# Patient Record
Sex: Male | Born: 1965 | Race: White | Hispanic: No | Marital: Married | Smoking: Never smoker
Health system: Southern US, Community
[De-identification: ages and names within clinical notes are randomized; demographics above are authoritative.]

## PROBLEM LIST (undated history)

## (undated) DIAGNOSIS — F419 Anxiety disorder, unspecified: Secondary | ICD-10-CM

## (undated) DIAGNOSIS — E785 Hyperlipidemia, unspecified: Secondary | ICD-10-CM

## (undated) DIAGNOSIS — E079 Disorder of thyroid, unspecified: Secondary | ICD-10-CM

## (undated) DIAGNOSIS — T7840XA Allergy, unspecified, initial encounter: Secondary | ICD-10-CM

## (undated) DIAGNOSIS — M1990 Unspecified osteoarthritis, unspecified site: Secondary | ICD-10-CM

## (undated) DIAGNOSIS — I10 Essential (primary) hypertension: Principal | ICD-10-CM

## (undated) DIAGNOSIS — E781 Pure hyperglyceridemia: Secondary | ICD-10-CM

## (undated) DIAGNOSIS — D3500 Benign neoplasm of unspecified adrenal gland: Secondary | ICD-10-CM

## (undated) HISTORY — DX: Anxiety disorder, unspecified: F41.9

## (undated) HISTORY — DX: Essential (primary) hypertension: I10

## (undated) HISTORY — DX: Allergy, unspecified, initial encounter: T78.40XA

## (undated) HISTORY — DX: Hyperlipidemia, unspecified: E78.5

## (undated) HISTORY — DX: Disorder of thyroid, unspecified: E07.9

## (undated) HISTORY — DX: Unspecified osteoarthritis, unspecified site: M19.90

## (undated) HISTORY — DX: Pure hyperglyceridemia: E78.1

## (undated) HISTORY — DX: Benign neoplasm of unspecified adrenal gland: D35.00

---

## 2009-02-22 HISTORY — PX: HERNIA REPAIR: SHX51

## 2011-10-20 ENCOUNTER — Ambulatory Visit (INDEPENDENT_AMBULATORY_CARE_PROVIDER_SITE_OTHER): Payer: BC Managed Care – PPO | Admitting: Sports Medicine

## 2011-10-20 VITALS — BP 114/82 | Ht 72.0 in | Wt 175.0 lb

## 2011-10-20 DIAGNOSIS — M7051 Other bursitis of knee, right knee: Secondary | ICD-10-CM | POA: Insufficient documentation

## 2011-10-20 DIAGNOSIS — IMO0002 Reserved for concepts with insufficient information to code with codable children: Secondary | ICD-10-CM

## 2011-10-20 NOTE — Progress Notes (Signed)
Patient ID: Sean Mckay, male   DOB: [birth year], 46 y.o.   MRN: [id number] Patient is a 46 year old male who works [employer] and past centers. He presents today with right knee pain. He reports that the pain has been causing him problems since the middle of February. It seems to be made worse by crawling, which he does on a regular basis, or by putting pressure on the bottom of his feet such as when he puts on socks. He does not remember a specific injury. The pain is generally located on the medial and anterior side of the right knee with perhaps some radiation proximal to this. He uses a variety of different terms to describe it including burning and throbbing. He has never injured this knee before and is currently taking no medications for it.  Objective: General: Appropriate weight male, well-developed, no distress Knee: Right knee has a slight amount of fullness on the medial side when compared to the left. There is no other erythema or obvious effusion. There is very mild tenderness to deep palpation just medial to the patellar tendon on the right. Otherwise knee exam is unremarkable. MCL/LCL are intact on exam, knee has full range of motion with no pain, and McMurray's is negative.  MSK ultrasound: Small increase in fluid in the Pes Anserine bursa on the right.  Normal patellar tendon and menisci.  Slight bone spur on the tibial tuberal.;  Distal infrapatellar bursa is also swollen without any increase in doppler flow  Assessment/Plan: Pes anserine bursitis.  Treat with ice/NSAIDs, compression when flaring, and attempts at activity modification and padding that knee.  Will give full knee compression brace to use as needed.  RTC PRN.

## 2011-10-20 NOTE — Assessment & Plan Note (Signed)
This primarily seems pressure related f his crawling actiivty  Also bursal swelling beneath PT  Ice massage  Use compression sleeve if this gives good protection  Reck if this does not settle down in 4 wks

## 2011-10-20 NOTE — Patient Instructions (Signed)
It was great to see you today! We want you to use some type of compression and cushioning during work.  If you have a day that it is particularly bothering you, please do an ice massage after work. If you have several days where it flairs up, start using either Advil three times per day or Aleve twice per day for several days until it calms down. Come back to see [place] as needed if your problems get worse.

## 2011-10-27 ENCOUNTER — Encounter (INDEPENDENT_AMBULATORY_CARE_PROVIDER_SITE_OTHER): Payer: Self-pay | Admitting: General Surgery

## 2011-10-27 ENCOUNTER — Ambulatory Visit (INDEPENDENT_AMBULATORY_CARE_PROVIDER_SITE_OTHER): Payer: BC Managed Care – PPO | Admitting: General Surgery

## 2011-10-27 VITALS — BP 120/81 | HR 61 | Temp 98.0°F | Ht 72.0 in | Wt 180.8 lb

## 2011-10-27 DIAGNOSIS — R1031 Right lower quadrant pain: Secondary | ICD-10-CM

## 2011-10-27 DIAGNOSIS — IMO0001 Reserved for inherently not codable concepts without codable children: Secondary | ICD-10-CM

## 2011-10-27 NOTE — Progress Notes (Signed)
Patient ID: Sean Mckay, male   DOB: 1965/10/24, 46 y.o.   MRN: [id number]  Chief Complaint  Patient presents with  . Pre-op Exam    eval hernia    HPI Sean Mckay is a 46 y.o. male.  He is self-referred for right groin pain and recurrent right inguinal hernia.  The patient presents today stating that he had a right inguinal hernia operation in 2010 in [place]. He brought an office note from [facility] by Dr. Marcial Pacas L. Sabino Gasser dated February 07, 2009. He also has a dictated operative note by Dr. Tobie Lords dated February 22, 2009 at which time he repaired a direct right inguinal hernia with mesh and excised a cord lipoma. That is all the records that he brought. He states that since that time he has had constant itching and dull pain in the right groin. He says that he intermittently has some sharper shooting pains down the thigh and the right flank this is not constant. He denies feeling a bulge.  He states that he has seen a pain clinic . in [facility], by ananesthesiologist Dr. Emmaline Life. He was sent there by the urologist according to him. He also states he saw a Gen. Careers adviser in [place] who reportedly told hi he had scar tissue .  He is seeking another opinion for his symptoms. HPI  Past Medical History  Diagnosis Date  . Hyperlipidemia   . Hypertension     Past Surgical History  Procedure Date  . Hernia repair 02/22/09    RIH    Family History  Problem Relation Age of Onset  . Cancer Father     leukemia  . Cancer Paternal Grandmother     colon    Social History History  Substance Use Topics  . Smoking status: Never Smoker   . Smokeless tobacco: Not on file  . Alcohol Use: No    Allergies  Allergen Reactions  . Phenergan     Current Outpatient Prescriptions  Medication Sig Dispense Refill  . aspirin 81 MG tablet Take 81 mg by mouth daily.      . Nebivolol HCl (BYSTOLIC PO) Take 1 tablet by mouth daily.        Review of  Systems Review of Systems  Constitutional: Negative for fever, chills and unexpected weight change.  HENT: Negative for hearing loss, congestion, sore throat, trouble swallowing and voice change.   Eyes: Negative for visual disturbance.  Respiratory: Negative for cough and wheezing.   Cardiovascular: Negative for chest pain, palpitations and leg swelling.  Gastrointestinal: Negative for nausea, vomiting, abdominal pain, diarrhea, constipation, blood in stool, abdominal distention, anal bleeding and rectal pain.  Genitourinary: Negative for hematuria and difficulty urinating.  Musculoskeletal: Negative for arthralgias.  Skin: Negative for rash and wound.  Neurological: Negative for seizures, syncope, weakness and headaches.  Hematological: Negative for adenopathy. Does not bruise/bleed easily.  Psychiatric/Behavioral: Negative for confusion.    Blood pressure 120/81, pulse 61, temperature 98 F (36.7 C), temperature source Temporal, height 6' (1.829 m), weight 180 lb 12.8 oz (82.01 kg), SpO2 96.00%.  Physical Exam Physical Exam  Constitutional: He is oriented to person, place, and time. He appears well-developed and well-nourished. No distress.  HENT:  Head: Normocephalic.  Nose: Nose normal.  Mouth/Throat: No oropharyngeal exudate.  Eyes: Conjunctivae and EOM are normal. Pupils are equal, round, and reactive to light. Right eye exhibits no discharge. Left eye exhibits no discharge. No scleral icterus.  Neck: Normal  range of motion. Neck supple. No JVD present. No tracheal deviation present. No thyromegaly present.  Cardiovascular: Normal rate, regular rhythm, normal heart sounds and intact distal pulses.   No murmur heard. Pulmonary/Chest: Effort normal and breath sounds normal. No stridor. No respiratory distress. He has no wheezes. He has no rales. He exhibits no tenderness.  Abdominal: Soft. Bowel sounds are normal. He exhibits no distension and no mass. There is no tenderness.  There is no rebound and no guarding.  Genitourinary:       Right inguinal scar well healed. Skin is healthy. Both testes descended, soft, no palpable abnormality, not atrophic. Examined supine and standing. There is no evidence of recurrent hernia. There is no mass. There is no significant tenderness or trigger point. There is no seroma.  Musculoskeletal: Normal range of motion. He exhibits no edema and no tenderness.  Lymphadenopathy:    He has no cervical adenopathy.  Neurological: He is alert and oriented to person, place, and time. He has normal reflexes. Coordination normal.  Skin: Skin is warm and dry. No rash noted. He is not diaphoretic. No erythema. No pallor.  Psychiatric: He has a normal mood and affect. His behavior is normal. Judgment and thought content normal.    [facility]. Operative note from Dr. Sabino Gasser dated February 22, 2009.  Assessment    Chronic right groin pain. This may be due to scar tissue or neuropathic pain. There is no evidence of recurrent hernia or infection. There is no evidence of testicular damage.  History of open repair right inguinal hernia with mesh 2010, in [place]   Plan    I had a long discussion with the patient regarding my review of his records, physical findings, and my  impression  regarding the pathophysiology of his pain.  He asked what his options were and my advice to him was referral back to the pain clinic anesthesiologist in [place], referral to a pain clinic in [facility] for a second opinion, or referral to a pain clinic at one of the universities. I told him that I did not think that reexploration of his right groin surgically would be beneficial, and  that was likely to cause more problems than benefit him. He seems to understand this.  He stated that he was going to go home and think about this. He will call is if he wants [place] to refer him to the pain clinic in  [place].       Ernestene Mention 10/27/2011, 5:22 PM

## 2011-10-27 NOTE — Patient Instructions (Signed)
The pain you are having in your right groin is not due to a recurrent hernia, and it is not due to an infection.  It is possible that you have chronic pain from scar tissue or nerve damage.  Please call us back if you wish [place]  to refer you to another pain clinic for a second opinion for management.

## 2015-12-18 ENCOUNTER — Telehealth: Payer: Self-pay | Admitting: Cardiology

## 2015-12-18 NOTE — Telephone Encounter (Signed)
Received records from [facility] for appointment on 01/23/16 with Dr Stanford Breed.   Records given to [facility] (medical records) for Dr Jacalyn Lefevre schedule on 01/23/16.  lp

## 2015-12-22 NOTE — Progress Notes (Signed)
Cardiology Office Note   Date:  12/24/2015   ID:  Teodoro Kil, DOB 03/01/1966, MRN [id number]  PCP:  Dyann Ruddle, MD  Cardiologist:   Skeet Latch, MD   Chief Complaint  Patient presents with  . New Evaluation    Referred by Samantha Crimes Johnson-Leonard, ANP for Uncontrolled HTN  pt c/o chest heaviness and SOB--occasional sharp pain on left side; no other Sx.    History of Present Illness: Sean Mckay is a 50 y.o. male with hypertension and hyperlipidemia who presents for management of hypertension. He has been taking Bytstolic for many years and notes that his blood pressure has been well-controlled until recently.  In January he developed headaches, chest pressure and shortness of breath.  In February he discovered that his BP was elevated and lisinopril was added to his regimen.  His blood pressure Was fairly well-controlled on this medication, however he developed dizziness. This was discontinued but he presented to the emergency department at [facility] on 12/02/15 for chest pain, dizziness and hypertensive emergency. His blood pressure was 188/118 and time.  Amlodipine was added to his regimen, but he had a syncopal episode in the absence of low blood pressure, so this was discontinued.  He was started on hydralazine prior to discharge.  His blood pressure was well-controlled, but he developed fatigue and muscle pain, so he stopped it.   He followed up at his primary care office and requested a referral to cardiology due to poorly controlled blood pressure and a family history of heart disease.  At that appointment he was started on nifedipine instead of hydralazine.  His muscle pain has improved but he now reports shortness of breath and the fatigue persists.  Also, his BP is not as well-controlled.    During the hospitalization Mr. Illes reportedly had carotid Dopplers that did not reveal any obstruction. He also had a nuclear stress test that was negative for  ischemia.  His lipids were checked and reportedly showed elevated triglycerides but were otherwise okay.  He reports wearing a 48-hour Holter ears ago that showed PACs and PVCs of note, Mr. Nyarko has a brother who died of sudden cardiac death at age 4. No autopsy was performed.  Both his mother and father have had heart attacks and bypass surgery. Maternal grandfather had a heart attack at age 75.  Mr. Joyner exercises at the gym at least twice weekly.  He exercises for one hour and denies chest pain or shortness of breath.  Overall his diet is healthy but he struggles with drinking sodas.    Adverse effects:  Amlodipine: syncope Lisinopril: dizziness Hydralazine: Muscle aches, fatigue Nifedipine: fatigue, shortness of breath  Past Medical History  Diagnosis Date  . Hyperlipidemia   . Hypertension     Past Surgical History  Procedure Laterality Date  . Hernia repair  02/22/09    RIH     Current Outpatient Prescriptions  Medication Sig Dispense Refill  . aspirin 81 MG tablet Take 81 mg by mouth daily.    . fenofibrate 160 MG tablet Take 1 tablet by mouth daily.  3  . levothyroxine (SYNTHROID, LEVOTHROID) 112 MCG tablet Take 1 tablet by mouth daily.  5  . Nebivolol HCl (BYSTOLIC PO) Take 20 mg by mouth daily.     . hydrochlorothiazide (MICROZIDE) 12.5 MG capsule Take 1 capsule (12.5 mg total) by mouth daily. 90 capsule 3   No current facility-administered medications for this visit.    Allergies:  Hydralazine; Lisinopril; Norvasc; Prednisone; Promethazine hcl; and Statins    Social History:  The patient  reports that he has never smoked. He does not have any smokeless tobacco history on file. He reports that he does not drink alcohol or use illicit drugs.   Family History:  The patient's family history includes Aneurysm in his maternal grandmother; Cancer in his father and paternal grandmother; Heart attack in his father, maternal grandfather, and mother; Stroke in his  maternal grandmother; Sudden death in his brother.    ROS:  Please see the history of present illness.   Otherwise, review of systems are positive for none.   All other systems are reviewed and negative.    PHYSICAL EXAM: VS:  BP 124/70 mmHg  Pulse 54  Ht 6' (1.829 m)  Wt 190 lb 9.6 oz (86.456 kg)  BMI 25.84 kg/m2 , BMI Body mass index is 25.84 kg/(m^2). GENERAL:  Well appearing HEENT:  Pupils equal round and reactive, fundi not visualized, oral mucosa unremarkable NECK:  No jugular venous distention, waveform within normal limits, carotid upstroke brisk and symmetric, no bruits, no thyromegaly LYMPHATICS:  No cervical adenopathy LUNGS:  Clear to auscultation bilaterally HEART:  RRR.  PMI not displaced or sustained,S1 and S2 within normal limits, no S3, no S4, no clicks, no rubs, no murmurs ABD:  Flat, positive bowel sounds normal in frequency in pitch, no bruits, no rebound, no guarding, no midline pulsatile mass, no hepatomegaly, no splenomegaly EXT:  2 plus pulses throughout, no edema, no cyanosis no clubbing SKIN:  No rashes no nodules NEURO:  Cranial nerves II through XII grossly intact, motor grossly intact throughout PSYCH:  Cognitively intact, oriented to person place and time    EKG:  EKG is ordered today. The ekg ordered today demonstrates Sinus bradycardia rate 54 bpm.  R axis deviation.    Recent Labs: No results found for requested labs within last 365 days.    Lipid Panel No results found for: CHOL, TRIG, HDL, CHOLHDL, VLDL, LDLCALC, LDLDIRECT    Wt Readings from Last 3 Encounters:  12/24/15 190 lb 9.6 oz (86.456 kg)  10/27/11 180 lb 12.8 oz (82.01 kg)  10/20/11 175 lb (79.379 kg)      ASSESSMENT AND PLAN:  # Hypertension  Blood pressure remains poorly controlled and he is not tolerating nifedipine well. We will stop nifedipine and start hydrochlorothiazide 12.5 mg daily. He will continue Bystolic 20 mg daily. In one week he will have a basic metabolic  panel checked with his primary care physician and have these results faxed to [place]. We will see him in 2 weeks to follow-up on his blood pressure control.  He had a renal artery ultrasound that did not reveal stenosis. Primary hyperaldosteronism is unlikely given that he does not have hypokalemia. Blood pressure is not refractive at this point he is only on 2 medications at this time and his blood pressure was well-controlled on by systolic and hydralazine. He was discontinued due to rather than failure of treatment. Therefore we will not pursue any additional testing for causes of secondary hypertension at this time.   # FH SCD: Mr. Aase reports a family history of sudden cardiac death in his 13 year old brother. He had a stress test in the hospital that he reports was negative for ischemia. We will obtain these records. We will also get his lipid profile from that hospitalization to determine if he needs any additional primary prevention with a statin.  There is no evidence  of Brugada or QT abnormalities on his EKG.  # Hypertriglyceridemia: Obtain lipids as above.  Continue fenofibrate.  # Atypical chest pain: Mr. Gurnee reports chest pain but it is atypical and he had a recent negative stress test. He does not have any exertional symptoms. No further testing is indicated at this time.  Current medicines are reviewed at length with the patient today.  The patient does not have concerns regarding medicines.  The following changes have been made:  Start HCTZ.  Stop nifedipine  Labs/ tests ordered today include:    Orders Placed This Encounter  Procedures  . Basic Metabolic Panel (BMET)  . EKG 12-Lead     Disposition:   FU with Breon Diss C. Oval Linsey, MD, [facility] in 2-4 weeks.    This note was written with the assistance of speech recognition software.  Please excuse any transcriptional errors.  Signed, Kerah Hardebeck C. Oval Linsey, MD, [facility]  12/24/2015 4:46 PM    [facility]

## 2015-12-24 ENCOUNTER — Encounter: Payer: Self-pay | Admitting: Cardiovascular Disease

## 2015-12-24 ENCOUNTER — Ambulatory Visit (INDEPENDENT_AMBULATORY_CARE_PROVIDER_SITE_OTHER): Payer: BLUE CROSS/BLUE SHIELD | Admitting: Cardiovascular Disease

## 2015-12-24 VITALS — BP 124/70 | HR 54 | Ht 72.0 in | Wt 190.6 lb

## 2015-12-24 DIAGNOSIS — E781 Pure hyperglyceridemia: Secondary | ICD-10-CM

## 2015-12-24 DIAGNOSIS — I10 Essential (primary) hypertension: Secondary | ICD-10-CM

## 2015-12-24 MED ORDER — HYDROCHLOROTHIAZIDE 12.5 MG PO CAPS: 12.5000 mg | ORAL_CAPSULE | Freq: Every day | ORAL | Status: DC

## 2015-12-24 NOTE — Patient Instructions (Signed)
Medication Instructions:   STOP NIFEDIPINE  START HCTZ 12.5 MG ONCE DAILY  Labwork:  Your physician recommends that you return for lab work in: [place]  Follow-Up:  Your physician recommends that you schedule a follow-up appointment in: 2-4 WEEKS WITH DR [facility]

## 2015-12-25 ENCOUNTER — Telehealth: Payer: Self-pay | Admitting: Cardiovascular Disease

## 2015-12-25 NOTE — Telephone Encounter (Signed)
Received records from [facility] as requested by Dr Oval Linsey.  Records given to Dr Oval Linsey for review and upcoming appointment on 02/19/16.  lp

## 2016-01-01 ENCOUNTER — Telehealth: Payer: Self-pay | Admitting: Cardiovascular Disease

## 2016-01-01 NOTE — Telephone Encounter (Signed)
Increase HCTZ to 25mg daily

## 2016-01-01 NOTE — Telephone Encounter (Signed)
Returned call to patient.He stated at his visit with Dr.Hiawatha 12/24/15 she stopped Nifedipine and started HCTZ 12.5 mg daily.Stated since the change he has noticed B/P going up each day.B/P today 151/95.Message sent to [place] for advice.

## 2016-01-01 NOTE — Telephone Encounter (Signed)
New message    Pt c/o medication issue:  1. Name of Medication: Hydrochlorothizide and Bystolic  2. How are you currently taking this medication (dosage and times per day)? 12.5 mg po daily/20 mg po daily  3. Are you having a reaction (difficulty breathing--STAT)? no  4. What is your medication issue?  The pt states the medications are not helping the blood pressure at all   The pt is calling with concerns the medicine is not help his blood pressure

## 2016-01-02 MED ORDER — HYDROCHLOROTHIAZIDE 25 MG PO TABS: 25.0000 mg | ORAL_TABLET | Freq: Every day | ORAL | Status: DC

## 2016-01-02 NOTE — Telephone Encounter (Signed)
Returned call to patient.Dr. advised to increase HCTZ to 25 mg daily.Advised to continue to monitor B/P and call back if continues to be elevated.

## 2016-01-23 ENCOUNTER — Ambulatory Visit: Payer: Self-pay | Admitting: Cardiology

## 2016-01-24 ENCOUNTER — Telehealth: Payer: Self-pay | Admitting: Cardiovascular Disease

## 2016-01-24 MED ORDER — VALSARTAN 40 MG PO TABS: 40.0000 mg | ORAL_TABLET | Freq: Every day | ORAL | Status: DC

## 2016-01-24 NOTE — Telephone Encounter (Signed)
Follow-up ° ° ° ° °The pt is returning the nurses call °

## 2016-01-24 NOTE — Telephone Encounter (Signed)
Pt c/o medication issue:  1. Name of Medication: hydrocloridoxy  2. How are you currently taking this medication (dosage and times per day)? 25mg  1x day 3. Are you having a reaction (difficulty breathing--STAT)? no  4. What is your medication issue? Low energy, constant cough, vision is different, sexual drive is down, dark order urine, constantly thirsty, and he is dizzy when he stands

## 2016-01-24 NOTE — Telephone Encounter (Signed)
Spoke to patient -   He reports that his BP have been well controlled on his current regimen, but currently it is a quality of life issue.  He has had persistent diarrhea since starting the HCTZ and all of his symptoms have worsened since increasing his dose.  Unfortunately he has tried many different medications and all have caused issues. In addition many blood pressure medications are associated with impotence in some level, though elevated BP can also cause this. I educated him on this.   He wishes to try something different to at least help with hydration. He reports he stopped the HCTZ yesterday. Will have him remain off HCTZ and start a low dose of valsartan 40mg  daily. Have chosen an ARB since it appears he has not yet tried this class of medications. He will follow up with Dr. Oval Linsey as scheduled in a few weeks. At that time he can be referred to HTN clinic if needed.   Patient agreeable to this plan.

## 2016-01-24 NOTE — Telephone Encounter (Signed)
Returned call to patient. Symptoms started since starting on HCTZ and increasing bystolic.  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 Oval Linsey & clinical pharmacy staff to review symptoms and current medications and he will be contacted w/advice. He voiced understanding.

## 2016-01-31 ENCOUNTER — Telehealth: Payer: Self-pay | Admitting: Cardiovascular Disease

## 2016-01-31 MED ORDER — VALSARTAN 80 MG PO TABS: 80.0000 mg | ORAL_TABLET | Freq: Every day | ORAL | Status: DC

## 2016-01-31 NOTE — Telephone Encounter (Signed)
Pt c/o BP issue: STAT if pt c/o blurred vision, one-sided weakness or slurred speech  1. What are your last 5 BP readings? 148/101 this AM- 7/20, 151/94- last night 7/19, 164/107- couple days ago  2. Are you having any other symptoms (ex. Dizziness, headache, blurred vision, passed out)? Some pressure in head/chest, some SOB- started couple days ago  3. What is your BP issue? After recent med adjustment, pt noticed inc in BP

## 2016-01-31 NOTE — Telephone Encounter (Signed)
Spoke with pt. HCTZ was stopped recently and Valsartan started. Pt made this change on 7/13. He reports the following BP/heart rate readings 7/20-148/101,56.  --Has not taken Valsartan and bystolic yet today. 7/19 -- AM-132/90,67. [id number]. 7/18-AM-142/90,66 [id number]. 7/17- AM-150/93, 70. [id number] 7/16-AM-129/82,73. [id number] 7/15-131/92,67 7/14-100/70,92.  This was after working in the yard using Chiropractor.  He reports prior to stopping HCTZ his readings were typically 115-120/80's.  He reports chest pressure, headache and shortness of breath since BP elevated.  He has had these symptoms in past when BP is elevated.  He is flying to [place] tomorrow and will return on 7/29.  Will forward to pharmacist and Dr. Oval Linsey for review/recommendations.

## 2016-01-31 NOTE — Telephone Encounter (Signed)
Returned call and LM.

## 2016-01-31 NOTE — Telephone Encounter (Signed)
Spoke with patient, will have him increase valsartan to 80 mg daily.  He can continue to monitor BP readings and call when returns from his vacation.  Patient voiced understanding.

## 2016-02-18 NOTE — Progress Notes (Addendum)
Cardiology Office Note   Date:  02/19/2016   ID:  Bexton Charron, DOB October 15, 1965, MRN [id number]  PCP:  Dyann Ruddle, MD  Cardiologist:   Skeet Latch, MD   No chief complaint on file.   History of Present Illness: Sean Mckay is a 50 y.o. male with hypertension and hyperlipidemia who presents for follow up on hypertension.  Mr. Sean Mckay was first seen 12/2015 due to poorly-controlled hypertension.  Mr. Sean Mckay has struggled with poorly-controlled hypertension and intolerance to many medications.  He had renal artery Dopplers that were negative for renal artery stenosis.  He also had carotid Dopplers due to an episode of syncope that were negative for obstruction.  He also had a nuclear stress test that was negative for ischemia.  His lipids were checked and reportedly showed elevated triglycerides but were otherwise okay.  He reports wearing a 48-hour Holter years ago that showed PACs and PVCs of note, Mr. Sean Mckay has a brother who died of sudden cardiac death at age 59. No autopsy was performed.  Both his mother and father have had heart attacks and bypass surgery. Maternal grandfather had a heart attack at age 52.  Given his family history of premature CAD and difficult to manage hypertension, he was referred to cardiology for management.  At his June appointment [name] was started on HCTZ  12.5 mg daily.  However he did not tolerate this and was started on valsartan, which was subsequently increased to 80 mg due to poor BP control. His BP has been around 140/90.  He notes that he has been feeling exhausted and easily short of breath. This has been ongoing since his hospitalization. He also has difficulty staying asleep.  His husband notes that he snores and has apneic episodes.   Adverse effects:  Amlodipine: syncope Lisinopril: dizziness Hydralazine: Muscle aches, fatigue Nifedipine: fatigue, shortness of breath HCTZ: thirst, cough, tremor   Past Medical History:    Diagnosis Date  . Hyperlipidemia   . Hypertension     Past Surgical History:  Procedure Laterality Date  . HERNIA REPAIR  02/22/09   [facility]     Current Outpatient Prescriptions  Medication Sig Dispense Refill  . aspirin 81 MG tablet Take 81 mg by mouth daily.    . fenofibrate 160 MG tablet Take 1 tablet by mouth daily.  3  . levothyroxine (SYNTHROID, LEVOTHROID) 112 MCG tablet Take 1 tablet by mouth daily.  5  . Nebivolol HCl (BYSTOLIC PO) Take 10 mg by mouth daily.     Marland Kitchen omega-3 acid ethyl esters (LOVAZA) 1 g capsule Take 1 g by mouth daily.    Marland Kitchen omeprazole (PRILOSEC) 20 MG capsule Take 20 mg by mouth daily.    Marland Kitchen terbinafine (LAMISIL) 250 MG tablet Take 1 tablet by mouth daily.  3  . valsartan (DIOVAN) 80 MG tablet Take 1 tablet (80 mg total) by mouth daily. 30 tablet 5  . vitamin E 200 UNIT capsule Take 200 Units by mouth daily.     No current facility-administered medications for this visit.     Allergies:   Hydralazine; Lisinopril; Norvasc [amlodipine besylate]; Prednisone; Promethazine hcl; and Statins    Social History:  The patient  reports that he has never smoked. He does not have any smokeless tobacco history on file. He reports that he does not drink alcohol or use drugs.   Family History:  The patient's family history includes Aneurysm in his maternal grandmother; Cancer in his father and paternal grandmother;  Heart attack in his father, maternal grandfather, and mother; Stroke in his maternal grandmother; Sudden death in his brother.    ROS:  Please see the history of present illness.   Otherwise, review of systems are positive for none.   All other systems are reviewed and negative.    PHYSICAL EXAM: VS:  BP 122/82   Pulse (!) 55   Ht 6' (1.829 m)   Wt 191 lb (86.6 kg)   SpO2 96%   BMI 25.90 kg/m  , BMI Body mass index is 25.9 kg/m. GENERAL:  Well appearing HEENT:  Pupils equal round and reactive, fundi not visualized, oral mucosa unremarkable NECK:  No  jugular venous distention, waveform within normal limits, carotid upstroke brisk and symmetric, no bruits, no thyromegaly LYMPHATICS:  No cervical adenopathy LUNGS:  Clear to auscultation bilaterally HEART:  RRR.  PMI not displaced or sustained,S1 and S2 within normal limits, no S3, no S4, no clicks, no rubs, no murmurs ABD:  Flat, positive bowel sounds normal in frequency in pitch, no bruits, no rebound, no guarding, no midline pulsatile mass, no hepatomegaly, no splenomegaly EXT:  2 plus pulses throughout, no edema, no cyanosis no clubbing SKIN:  No rashes no nodules NEURO:  Cranial nerves II through XII grossly intact, motor grossly intact throughout PSYCH:  Cognitively intact, oriented to person place and time   EKG:  EKG is not ordered today. The ekg ordered 12/24/15 demonstrates Sinus bradycardia rate 54 bpm.  R axis deviation.   Lexiscan Myoview 12/03/15:  LVEF 61%.  No ischemia.  Carotid artery Doppler 12/05/15: 1-39% ICA stenosis bilaterally  Echo 12/03/15:  Normal LV size and function.  Mild MR, trace TR.   Recent Labs: No results found for requested labs within last 8760 hours.    Lipid Panel No results found for: CHOL, TRIG, HDL, CHOLHDL, VLDL, LDLCALC, LDLDIRECT  12/02/15: Total cholesterol 199, triglycerides 180 HDL 36, LDL 126  Wt Readings from Last 3 Encounters:  02/19/16 191 lb (86.6 kg)  12/24/15 190 lb 9.6 oz (86.5 kg)  10/27/11 180 lb 12.8 oz (82 kg)      ASSESSMENT AND PLAN:  # Hypertension  Blood pressure is well-controlled today be elevated at home.  He thinks the higher dose of Bystolic is making him fatigued.  We will reduce this to 10 mg daily.  Continue valsartan 80 mg.  I explained that is is very unlikely that his BP will be controlled at this dose, given that is is not controlled at 20 mg.  If is BP is >140/90, he will increase valsartan to 160 mg daily.  He has undergone a work up for secondary causes of hypertension.   # Hypertriglyceridemia: Oheck  fasting lipids.  Continue fenofibrate.  # Snoring: Referral to sleep center.   Current medicines are reviewed at length with the patient today.  The patient does not have concerns regarding medicines.  The following changes have been made:  Reduce Bystolic to 10 mg daily.  Likely increase valsartan to 160 mg if BP remains >140/90.  Labs/ tests ordered today include:    Orders Placed This Encounter  Procedures  . Basic metabolic panel  . Lipid panel  . TSH  . Split night study     Disposition:   FU with Jerick Khachatryan C. Oval Linsey, MD, [facility] in 1 month   This note was written with the assistance of speech recognition software.  Please excuse any transcriptional errors.  Signed, Sara Keys C. Oval Linsey, MD, [facility]  02/19/2016  5:59 PM    [facility]

## 2016-02-19 ENCOUNTER — Encounter (INDEPENDENT_AMBULATORY_CARE_PROVIDER_SITE_OTHER): Payer: Self-pay

## 2016-02-19 ENCOUNTER — Encounter: Payer: Self-pay | Admitting: Cardiovascular Disease

## 2016-02-19 ENCOUNTER — Ambulatory Visit (INDEPENDENT_AMBULATORY_CARE_PROVIDER_SITE_OTHER): Payer: BLUE CROSS/BLUE SHIELD | Admitting: Cardiovascular Disease

## 2016-02-19 VITALS — BP 122/82 | HR 55 | Ht 72.0 in | Wt 191.0 lb

## 2016-02-19 DIAGNOSIS — E781 Pure hyperglyceridemia: Secondary | ICD-10-CM | POA: Diagnosis not present

## 2016-02-19 DIAGNOSIS — Z79899 Other long term (current) drug therapy: Secondary | ICD-10-CM | POA: Diagnosis not present

## 2016-02-19 DIAGNOSIS — R0683 Snoring: Secondary | ICD-10-CM | POA: Diagnosis not present

## 2016-02-19 DIAGNOSIS — I10 Essential (primary) hypertension: Secondary | ICD-10-CM | POA: Diagnosis not present

## 2016-02-19 NOTE — Patient Instructions (Addendum)
Medication Instructions:  DECREASE YOUR BYSTOLIC TO 10 MG DAILY  Labwork: LP/TSH/BMET AT [facility] ON THE FIRST FLOOR  Testing/Procedures: Your physician has recommended that you have a sleep study. This test records several body functions during sleep, including: brain activity, eye movement, oxygen and carbon dioxide blood levels, heart rate and rhythm, breathing rate and rhythm, the flow of air through your mouth and nose, snoring, body muscle movements, and chest and belly movement.  Follow-Up: Your physician recommends that you schedule a follow-up appointment in: 1 MONTH OV  Any Other Special Instructions Will Be Listed Below (If Applicable). MONITOR YOUR BLOOD PRESSURE FOR THE NEXT WEEK, IF IT START TO GO UP INCREASE YOUR VALSARTAN TO 160 MG DAILY   If you need a refill on your cardiac medications before your next appointment, please call your pharmacy.

## 2016-03-20 ENCOUNTER — Telehealth: Payer: Self-pay | Admitting: Cardiovascular Disease

## 2016-03-20 NOTE — Telephone Encounter (Signed)
Outside records reviewed:  02/20/16: Total cholesterol 244, triglycerides 179, HDL 48, LDL 187

## 2016-03-28 ENCOUNTER — Telehealth: Payer: Self-pay | Admitting: *Deleted

## 2016-03-28 NOTE — Telephone Encounter (Signed)
-----   Message from Skeet Latch, MD sent at 03/20/2016 10:20 AM EDT ----- Cholesterol levels are very elevated.  Please refer to lipid clinic.

## 2016-03-28 NOTE — Telephone Encounter (Signed)
Left message to call back  

## 2016-03-31 NOTE — Telephone Encounter (Signed)
Spoke with patient and he saw PCP recently. Per patient his PCP wanted to have him start Livalo however he refused Patient would like to keep follow up later this month and discuss with Dr Oval Linsey further

## 2016-04-01 ENCOUNTER — Ambulatory Visit (HOSPITAL_BASED_OUTPATIENT_CLINIC_OR_DEPARTMENT_OTHER): Payer: BLUE CROSS/BLUE SHIELD | Attending: Cardiovascular Disease | Admitting: Cardiovascular Disease

## 2016-04-01 VITALS — Ht 72.0 in | Wt 185.0 lb

## 2016-04-01 DIAGNOSIS — R5383 Other fatigue: Secondary | ICD-10-CM | POA: Diagnosis present

## 2016-04-01 DIAGNOSIS — R0683 Snoring: Secondary | ICD-10-CM | POA: Diagnosis not present

## 2016-04-01 DIAGNOSIS — G4733 Obstructive sleep apnea (adult) (pediatric): Secondary | ICD-10-CM

## 2016-04-01 DIAGNOSIS — G4710 Hypersomnia, unspecified: Secondary | ICD-10-CM | POA: Diagnosis present

## 2016-04-04 ENCOUNTER — Encounter: Payer: Self-pay | Admitting: Cardiovascular Disease

## 2016-04-07 ENCOUNTER — Ambulatory Visit (INDEPENDENT_AMBULATORY_CARE_PROVIDER_SITE_OTHER): Payer: BLUE CROSS/BLUE SHIELD | Admitting: Cardiovascular Disease

## 2016-04-07 ENCOUNTER — Encounter: Payer: Self-pay | Admitting: Cardiovascular Disease

## 2016-04-07 VITALS — BP 134/84 | HR 58 | Ht 72.0 in | Wt 191.0 lb

## 2016-04-07 DIAGNOSIS — Z01812 Encounter for preprocedural laboratory examination: Secondary | ICD-10-CM

## 2016-04-07 DIAGNOSIS — R079 Chest pain, unspecified: Secondary | ICD-10-CM

## 2016-04-07 DIAGNOSIS — E785 Hyperlipidemia, unspecified: Secondary | ICD-10-CM

## 2016-04-07 DIAGNOSIS — I10 Essential (primary) hypertension: Secondary | ICD-10-CM | POA: Diagnosis not present

## 2016-04-07 NOTE — Progress Notes (Signed)
Cardiology Office Note   Date:  04/07/2016   ID:  Sean Mckay, DOB 03/23/1966, MRN [id number]  PCP:  Dyann Ruddle, MD  Cardiologist:   Skeet Latch, MD   Chief Complaint  Patient presents with  . Follow-up    1 MONTH; sharp pain in back radiating to chest.     History of Present Illness: Sean Mckay is a 50 y.o. male with hypertension and hyperlipidemia who presents for follow up on hypertension.  Sean Mckay was first seen 12/2015 due to poorly-controlled hypertension.  Sean Mckay has struggled with poorly-controlled hypertension and intolerance to many medications.  He had renal artery Dopplers that were negative for renal artery stenosis.  He also had carotid Dopplers due to an episode of syncope that were negative for obstruction.  He also had a nuclear stress test that was negative for ischemia.  His lipids were checked and reportedly showed elevated triglycerides but were otherwise okay.  He reports wearing a 48-hour Holter years ago that showed PACs and PVCs of note, Sean Mckay has a brother who died of sudden cardiac death at age 4. No autopsy was performed.  Both his mother and father have had heart attacks and bypass surgery. Maternal grandfather had a heart attack at age 59.  Given his family history of premature CAD and difficult to manage hypertension, he was referred to cardiology for management.  At his June appointment Sean Mckay was started on HCTZ  12.5 mg daily.  However he did not tolerate this and was started on valsartan, which was subsequently increased to 80 mg due to poor BP control.  He developed fatigue and shortness of breath on nebivolol 20mg  daily, so this was reduced.  Since making that change his blood pressure has been well-controlled.  He notes significant improvement in his shortness of breath and fatigue. He reports several episodes of sharp pain that originate in his back and radiated to his chest. This is been ongoing intermittently for the  last year. Each episode lasts for less than 1 minute and occurs approximately every 3 or 4 weeks. There is no associated shortness of breath, nausea, vomiting, or diaphoresis. The pain is 7 out of 10 in severity. It occurs both at rest as well as with exertion.    Adverse effects:  Amlodipine: syncope Lisinopril: dizziness Hydralazine: Muscle aches, fatigue Nifedipine: fatigue, shortness of breath HCTZ: thirst, cough, tremor Nebivolol 20 mg: fatigue, shortness of breath.  OK on 10 mg.   Past Medical History:  Diagnosis Date  . Hyperlipidemia   . Hypertension     Past Surgical History:  Procedure Laterality Date  . HERNIA REPAIR  02/22/09   [facility]H     Current Outpatient Prescriptions  Medication Sig Dispense Refill  . aspirin 81 MG tablet Take 81 mg by mouth daily.    . fenofibrate 160 MG tablet Take 1 tablet by mouth daily.  3  . levothyroxine (SYNTHROID, LEVOTHROID) 112 MCG tablet Take 1 tablet by mouth daily.  5  . Nebivolol HCl (BYSTOLIC PO) Take 10 mg by mouth daily.     Marland Kitchen omeprazole (PRILOSEC) 20 MG capsule Take 20 mg by mouth daily.    Marland Kitchen terbinafine (LAMISIL) 250 MG tablet Take 1 tablet by mouth daily.  3  . valsartan (DIOVAN) 160 MG tablet Take 160 mg by mouth.    . vitamin E 200 UNIT capsule Take 200 Units by mouth daily.     No current facility-administered medications for this visit.  Allergies:   Hydralazine; Lisinopril; Norvasc [amlodipine besylate]; Prednisone; Promethazine hcl; and Statins    Social History:  The patient  reports that he has never smoked. He has never used smokeless tobacco. He reports that he does not drink alcohol or use drugs.   Family History:  The patient's family history includes Aneurysm in his maternal grandmother; Cancer in his father and paternal grandmother; Heart attack in his father, maternal grandfather, and mother; Stroke in his maternal grandmother; Sudden death in his brother.    ROS:  Please see the history of present  illness.   Otherwise, review of systems are positive for none.   All other systems are reviewed and negative.    PHYSICAL EXAM: VS:  BP 134/84   Pulse (!) 58   Ht 6' (1.829 m)   Wt 191 lb (86.6 kg)   BMI 25.90 kg/m  , BMI Body mass index is 25.9 kg/m. GENERAL:  Well appearing HEENT:  Pupils equal round and reactive, fundi not visualized, oral mucosa unremarkable NECK:  No jugular venous distention, waveform within normal limits, carotid upstroke brisk and symmetric, no bruits, no thyromegaly LYMPHATICS:  No cervical adenopathy LUNGS:  Clear to auscultation bilaterally HEART:  RRR.  PMI not displaced or sustained,S1 and S2 within normal limits, no S3, no S4, no clicks, no rubs, no murmurs ABD:  Flat, positive bowel sounds normal in frequency in pitch, no bruits, no rebound, no guarding, no midline pulsatile mass, no hepatomegaly, no splenomegaly EXT:  2 plus pulses throughout, no edema, no cyanosis no clubbing SKIN:  No rashes no nodules NEURO:  Cranial nerves II through XII grossly intact, motor grossly intact throughout PSYCH:  Cognitively intact, oriented to person place and time   EKG:  EKG is not ordered today. The ekg ordered 12/24/15 demonstrates Sinus bradycardia rate 54 bpm.  R axis deviation.   Lexiscan Myoview 12/03/15:  LVEF 61%.  No ischemia.  Carotid artery Doppler 12/05/15: 1-39% ICA stenosis bilaterally  Echo 12/03/15:  Normal LV size and function.  Mild MR, trace TR.   Recent Labs: No results found for requested labs within last 8760 hours.    Lipid Panel No results found for: CHOL, TRIG, HDL, CHOLHDL, VLDL, LDLCALC, LDLDIRECT 12/02/15: Total cholesterol 199, triglycerides 180 HDL 36, LDL 126  Wt Readings from Last 3 Encounters:  04/07/16 191 lb (86.6 kg)  04/01/16 185 lb (83.9 kg)  02/19/16 191 lb (86.6 kg)      ASSESSMENT AND PLAN:  # Chest/back pain: Mr. Kerl has atypical chest pain.   However, given his significant family history of sudden  cardiac death and premature coronary artery disease as well as hyperlipidemia, we will obtain a cardiac CT angiogram with coronary calcium scoring to better assess his risk and symptoms.   # Hypertension  Blood pressure is well-controlled.  Continue nebivolol and valsartan.    # Hypertriglyceridemia: # Hyperlipidemia:  ASCVD 10 year risk is 6.1%.  Therefore, no statin unless CAD is noted on cardiac CT-A.  Continue fenofibrate.  # Snoring: Sleep study pending.    Current medicines are reviewed at length with the patient today.  The patient does not have concerns regarding medicines.  The following changes have been made:  None  Labs/ tests ordered today include:    Orders Placed This Encounter  Procedures  . CT Heart Morp W/Cta Cor W/Score W/Ca W/Cm &/Or Wo/Cm  . Basic metabolic panel     Disposition:   FU with Melodie Ashworth C. Oval Linsey, MD,  [facility] in 1 month   This note was written with the assistance of speech recognition software.  Please excuse any transcriptional errors.  Signed, Emitt Maglione C. Oval Linsey, MD, [facility]  04/07/2016 10:01 AM    [place]

## 2016-04-07 NOTE — Patient Instructions (Addendum)
Medication Instructions:  Your physician recommends that you continue on your current medications as directed. Please refer to the Current Medication list given to you today.  Labwork: BMET AT YOUR PRIMARY CARE WITHIN 30 DAYS OF CTA  Testing/Procedures: CARDIAC CTA  Follow-Up: Your physician recommends that you schedule a follow-up appointment in: [place]

## 2016-04-08 NOTE — Procedures (Signed)
    Patient Name: Franisco, Fedorov Date: 04/01/2016 Gender: Male D.O.B: 01/04/1966 Age (years): 31 Referring Provider: Skeet Latch Height (inches): 72 Interpreting Physician: Shelva Majestic MD, [facility] Weight (lbs): 185 RPSGT: Laren Everts BMI: 25 MRN: [id number] Neck Size: 15.50  CLINICAL INFORMATION Sleep Study Type: NPSG Indication for sleep study: Excessive Daytime Sleepiness, Fatigue, Snoring Epworth Sleepiness Score: 7  SLEEP STUDY TECHNIQUE As per the AASM Manual for the Scoring of Sleep and Associated Events v2.3 (April 2016) with a hypopnea requiring 4% desaturations. The channels recorded and monitored were frontal, central and occipital EEG, electrooculogram (EOG), submentalis EMG (chin), nasal and oral airflow, thoracic and abdominal wall motion, anterior tibialis EMG, snore microphone, electrocardiogram, and pulse oximetry.  MEDICATIONS aspirin 81 MG tablet fenofibrate 160 MG tablet levothyroxine (SYNTHROID, LEVOTHROID) 112 MCG tablet Nebivolol HCl (BYSTOLIC PO) omeprazole (PRILOSEC) 20 MG capsule terbinafine (LAMISIL) 250 MG tablet valsartan (DIOVAN) 160 MG tablet vitamin E 200 UNIT capsule  Medications self-administered by patient during sleep study : No sleep medicine administered.  SLEEP ARCHITECTURE The study was initiated at 10:05:45 PM and ended at 5:12:14 AM. Sleep onset time was 46.8 minutes and the sleep efficiency was 65.5%. The total sleep time was 279.5 minutes.  Wake after sleep onset (WASO) was 100 minutes. Stage REM latency was 56.0 minutes. The patient spent 16.82% of the night in stage N1 sleep, 68.52% in stage N2 sleep, 0.00% in stage N3 and 14.67% in REM. Alpha intrusion was absent. Supine sleep was 26.48%.  RESPIRATORY PARAMETERS The overall apnea/hypopnea index (AHI) was 0.9 per hour. There were 0 total apneas, including 0 obstructive, 0 central and 0 mixed apneas. There were 4 hypopneas and 7 RERAs. The AHI during Stage REM  sleep was 5.9 per hour. AHI while supine was 3.2 per hour. The mean oxygen saturation was 93.11%. The minimum SpO2 during sleep was 89.00%. Soft snoring was noted during this study.  CARDIAC DATA The 2 lead EKG demonstrated sinus rhythm. The mean heart rate was 54.94 beats per minute. Other EKG findings include: None.  LEG MOVEMENT DATA The total PLMS were 4 with a resulting PLMS index of 0.86. Associated arousal with leg movement index was 0.6.  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 - No significant central sleep apnea occurred during this study (CAI = 0.0/h). - The patient had minimal oxygen desaturation during the study (Min O2 = 89.00%) - The patient snored with Soft snoring volume. - No cardiac abnormalities were noted during this study. - Clinically significant periodic limb movements did not occur during sleep. No significant associated arousals.  DIAGNOSIS - Snoring  RECOMMENDATIONS - At present there is no indication for CPAP therapy. - Efforts should be made to optimize nasal and oropharygeal patency; consider alternatives for mild snoring. - Avoid alcohol, sedatives and other CNS depressants that may worsen sleep apnea and disrupt normal sleep architecture. - Sleep hygiene should be reviewed to assess factors that may improve sleep quality. - Weight management and regular exercise should be continued if appropriate.   [Electronically signed] 04/08/2016 11:42 AM  Shelva Majestic MD, [facility], [place], American Board of Sleep Medicine   NPI: [id number] [place] PH: [phone]   FX: [phone] [place]

## 2016-04-15 ENCOUNTER — Ambulatory Visit (HOSPITAL_COMMUNITY)
Admission: RE | Admit: 2016-04-15 | Discharge: 2016-04-15 | Disposition: A | Discharge status: Home / Self Care | Payer: BLUE CROSS/BLUE SHIELD | Source: Ambulatory Visit | Attending: Cardiovascular Disease | Admitting: Cardiovascular Disease

## 2016-04-15 ENCOUNTER — Encounter (HOSPITAL_COMMUNITY): Payer: Self-pay

## 2016-04-15 DIAGNOSIS — E785 Hyperlipidemia, unspecified: Secondary | ICD-10-CM

## 2016-04-15 DIAGNOSIS — R079 Chest pain, unspecified: Secondary | ICD-10-CM | POA: Diagnosis not present

## 2016-04-15 DIAGNOSIS — I288 Other diseases of pulmonary vessels: Secondary | ICD-10-CM | POA: Diagnosis not present

## 2016-04-15 DIAGNOSIS — R0789 Other chest pain: Secondary | ICD-10-CM | POA: Insufficient documentation

## 2016-04-15 MED ORDER — IOPAMIDOL (ISOVUE-370) INJECTION 76%
INTRAVENOUS | Status: AC
  Administered 2016-04-15: 100 mL
  Filled 2016-04-15: qty 100

## 2016-04-15 MED ORDER — NITROGLYCERIN 0.4 MG SL SUBL
0.8000 mg | SUBLINGUAL_TABLET | Freq: Once | SUBLINGUAL | Status: AC
  Administered 2016-04-15: 0.8 mg via SUBLINGUAL
  Filled 2016-04-15: qty 25

## 2016-04-15 MED ORDER — NITROGLYCERIN 0.4 MG SL SUBL
SUBLINGUAL_TABLET | SUBLINGUAL | Status: AC
  Filled 2016-04-15: qty 2

## 2016-04-15 NOTE — Progress Notes (Signed)
pt aware of results  

## 2016-05-05 ENCOUNTER — Ambulatory Visit: Payer: BLUE CROSS/BLUE SHIELD | Admitting: Physician Assistant

## 2016-05-07 NOTE — Progress Notes (Signed)
Cardiology Office Note   Date:  05/09/2016   ID:  Sean Mckay, DOB Jan 01, 1966, MRN [id number]  PCP:  Sean Ruddle, MD  Cardiologist:   Sean Latch, MD   Chief Complaint  Patient presents with  . Follow-up    1 month; Pt states no Sx.    History of Present Illness: Sean Mckay is a 50 y.o. male with hypertension and hyperlipidemia who presents for follow up.  Sean Mckay was first seen 12/2015 due to poorly-controlled hypertension.  Sean Mckay has struggled with poorly-controlled hypertension and intolerance to many medications.  He had renal artery Dopplers that were negative for renal artery stenosis.  He also had carotid Dopplers due to an episode of syncope that were negative for obstruction.  He also had a nuclear stress test that was negative for ischemia. He reports wearing a 48-hour Holter years ago that showed PACs and PVCs.  Of note, Sean Mckay has a brother who died of sudden cardiac death at age 41. No autopsy was performed.  Both his mother and father have had heart attacks and bypass surgery. Maternal grandfather had a heart attack at age 76.    At his June appointment Sean Mckay was started on HCTZ  12.5 mg daily.  However he did not tolerate this and was started on valsartan, which was subsequently increased to 80 mg due to poor BP control.  He developed fatigue and shortness of breath on nebivolol 20mg  daily, so this was reduced.  Since making that change his blood pressure has been well-controlled.  At his last appointment 03/2016 he reported episodes of sharp, atypical chest pain.  Given this as well as his family history of CAD and borderline lipids, he was referred for cardiac CT-A.   He was found to have a coronary calcium score of 0.  His pulmonary artery was noted to be mildly dilated to 3.1 x 2.7 cm.  Since then he reports a few episodes of chest pain.  He continues to monitor his BP at home and it has been well-controlled.  Adverse effects:    Amlodipine: syncope Lisinopril: dizziness Hydralazine: Muscle aches, fatigue Nifedipine: fatigue, shortness of breath HCTZ: thirst, cough, tremor Nebivolol 20 mg: fatigue, shortness of breath.  OK on 10 mg.   Past Medical History:  Diagnosis Date  . Adrenal adenoma 05/09/2016  . Essential hypertension 05/09/2016  . Hyperlipidemia   . Hypertension   . Hypertriglyceridemia 05/09/2016    Past Surgical History:  Procedure Laterality Date  . HERNIA REPAIR  02/22/09   [facility]     Current Outpatient Prescriptions  Medication Sig Dispense Refill  . aspirin 81 MG tablet Take 81 mg by mouth daily.    . fenofibrate 160 MG tablet Take 1 tablet by mouth daily.  3  . levothyroxine (SYNTHROID, LEVOTHROID) 112 MCG tablet Take 1 tablet by mouth daily.  5  . Nebivolol HCl (BYSTOLIC PO) Take 10 mg by mouth daily.     Marland Kitchen omeprazole (PRILOSEC) 20 MG capsule Take 20 mg by mouth daily.    . valsartan (DIOVAN) 80 MG tablet TK 1 T PO D  5  . vitamin E 200 UNIT capsule Take 200 Units by mouth daily.     No current facility-administered medications for this visit.     Allergies:   Hydralazine; Lisinopril; Norvasc [amlodipine besylate]; Prednisone; Promethazine hcl; and Statins    Social History:  The patient  reports that he has never smoked. He has never used smokeless tobacco.  He reports that he does not drink alcohol or use drugs.   Family History:  The patient's family history includes Aneurysm in his maternal grandmother; Cancer in his father and paternal grandmother; Heart attack in his father, maternal grandfather, and mother; Stroke in his maternal grandmother; Sudden death in his brother.    ROS:  Please see the history of present illness.   Otherwise, review of systems are positive for none.   All other systems are reviewed and negative.    PHYSICAL EXAM: VS:  BP (!) 145/91   Pulse (!) 59   Ht 6' (1.829 m)   Wt 86.9 kg (191 lb 9.6 oz)   BMI 25.99 kg/m  , BMI Body mass index is 25.99  kg/m. GENERAL:  Well appearing HEENT:  Pupils equal round and reactive, fundi not visualized, oral mucosa unremarkable NECK:  No jugular venous distention, waveform within normal limits, carotid upstroke brisk and symmetric, no bruits LYMPHATICS:  No cervical adenopathy LUNGS:  Clear to auscultation bilaterally HEART:  RRR.  PMI not displaced or sustained,S1 and S2 within normal limits, no S3, no S4, no clicks, no rubs, no murmurs ABD:  Flat, positive bowel sounds normal in frequency in pitch, no bruits, no rebound, no guarding, no midline pulsatile mass, no hepatomegaly, no splenomegaly EXT:  2 plus pulses throughout, no edema, no cyanosis no clubbing SKIN:  No rashes no nodules NEURO:  Cranial nerves II through XII grossly intact, motor grossly intact throughout PSYCH:  Cognitively intact, oriented to person place and time   EKG:  EKG is not ordered today. The ekg ordered 12/24/15 demonstrates Sinus bradycardia rate 54 bpm.  R axis deviation.   Lexiscan Myoview 12/03/15:  LVEF 61%.  No ischemia.  Carotid artery Doppler 12/05/15: 1-39% ICA stenosis bilaterally  Echo 12/03/15:  Normal LV size and function.  Mild MR, trace TR.   Recent Labs: No results found for requested labs within last 8760 hours.    Lipid Panel No results found for: CHOL, TRIG, HDL, CHOLHDL, VLDL, LDLCALC, LDLDIRECT 12/02/15: Total cholesterol 199, triglycerides 180 HDL 36, LDL 126  Wt Readings from Last 3 Encounters:  05/08/16 86.9 kg (191 lb 9.6 oz)  04/07/16 86.6 kg (191 lb)  04/01/16 83.9 kg (185 lb)      ASSESSMENT AND PLAN:  # Adrenal adenoma: Sean Mckay was noted to have a R adrenal adenoma.  Given his hypertension we will check plasma metanephrines.  He prefers to have this preformed with his PCP.  # Chest/back pain: Sean Mckay has experienced a couple episodes of chest pain.  This is non-cardiac, as he has no coronary disease on cardiac CT-A.  No aorta disease.   # Hypertension  Blood  pressure is slightly above goal today but well-controlled at home.  Continue nebivolol and valsartan.    # Hypertriglyceridemia: # Hyperlipidemia:  ASCVD 10 year risk is 6.1%.  Therefore, no statin, especially given his normal coronaries on CT.  We discussed diet and exercise.    # Snoring: Sleep study negative for sleep apnea.   Current medicines are reviewed at length with the patient today.  The patient does not have concerns regarding medicines.  The following changes have been made:  None  Labs/ tests ordered today include:    Orders Placed This Encounter  Procedures  . Metanephrines, plasma     Disposition:   FU with Hayde Kilgour C. Oval Linsey, MD, [facility] as needed.    This note was written with the assistance of speech  recognition software.  Please excuse any transcriptional errors.  Signed, Sharen Youngren C. Oval Linsey, MD, [facility]  05/09/2016 11:22 PM    [place]

## 2016-05-08 ENCOUNTER — Ambulatory Visit (INDEPENDENT_AMBULATORY_CARE_PROVIDER_SITE_OTHER): Payer: BLUE CROSS/BLUE SHIELD | Admitting: Cardiovascular Disease

## 2016-05-08 VITALS — BP 145/91 | HR 59 | Ht 72.0 in | Wt 191.6 lb

## 2016-05-08 DIAGNOSIS — D3500 Benign neoplasm of unspecified adrenal gland: Secondary | ICD-10-CM | POA: Diagnosis not present

## 2016-05-08 DIAGNOSIS — E781 Pure hyperglyceridemia: Secondary | ICD-10-CM | POA: Diagnosis not present

## 2016-05-08 DIAGNOSIS — I10 Essential (primary) hypertension: Secondary | ICD-10-CM

## 2016-05-08 NOTE — Patient Instructions (Addendum)
Medication Instructions:  Your physician recommends that you continue on your current medications as directed. Please refer to the Current Medication list given to you today.  Labwork: PLASMA METANEPHRINES AT PCP   Testing/Procedures: NONE  Follow-Up: AS NEEDED

## 2016-05-09 ENCOUNTER — Encounter: Payer: Self-pay | Admitting: Cardiovascular Disease

## 2016-05-09 DIAGNOSIS — E781 Pure hyperglyceridemia: Secondary | ICD-10-CM

## 2016-05-09 DIAGNOSIS — I10 Essential (primary) hypertension: Secondary | ICD-10-CM

## 2016-05-09 DIAGNOSIS — E785 Hyperlipidemia, unspecified: Secondary | ICD-10-CM | POA: Insufficient documentation

## 2016-05-09 DIAGNOSIS — D3500 Benign neoplasm of unspecified adrenal gland: Secondary | ICD-10-CM

## 2016-05-09 HISTORY — DX: Pure hyperglyceridemia: E78.1

## 2016-05-09 HISTORY — DX: Benign neoplasm of unspecified adrenal gland: D35.00

## 2016-05-09 HISTORY — DX: Essential (primary) hypertension: I10

## 2016-06-09 ENCOUNTER — Telehealth: Payer: Self-pay | Admitting: *Deleted

## 2016-06-09 NOTE — Telephone Encounter (Signed)
Patient called and stated that his pcp sent in him an rx for bystolic but it required a prior authorization. The pcp office submitted the PA, but it was denied. He was instructed to contact his cardiologist to see if they could get it approved for him. He tells me that his pcp sent him an rx for a different medication, but he is not willing to try it at this time. Patient can be reached at [phone]. Thanks, MI

## 2016-06-23 NOTE — Telephone Encounter (Signed)
Spoke with patient and samples left at front desk

## 2016-09-10 ENCOUNTER — Other Ambulatory Visit: Payer: Self-pay

## 2016-09-10 MED ORDER — VALSARTAN 80 MG PO TABS: 80.0000 mg | ORAL_TABLET | Freq: Every day | ORAL | 0 refills | Status: DC

## 2017-01-27 ENCOUNTER — Other Ambulatory Visit: Payer: Self-pay

## 2017-01-27 ENCOUNTER — Telehealth: Payer: Self-pay | Admitting: Cardiovascular Disease

## 2017-01-27 MED ORDER — VALSARTAN 80 MG PO TABS
80.0000 mg | ORAL_TABLET | Freq: Every day | ORAL | 1 refills | Status: DC
Start: 2017-01-27 — End: 2021-02-11

## 2017-01-27 NOTE — Telephone Encounter (Signed)
New message    Needs replacement for Valsartan 80mg  it has been recalled

## 2017-01-27 NOTE — Telephone Encounter (Signed)
Spoke with patient regarding valsartan, he asked we send [facility] script to [facility] [place]. Send [facility] meds [facility] [place],

## 2017-01-30 ENCOUNTER — Telehealth: Payer: Self-pay | Admitting: Cardiovascular Disease

## 2017-01-30 NOTE — Telephone Encounter (Signed)
New message      Pt c/o BP issue: STAT if pt c/o blurred vision, one-sided weakness or slurred speech  1. What are your last 5 BP readings?  173/112 last night, 149/110 this am  2. Are you having any other symptoms (ex. Dizziness, headache, blurred vision, passed out)?  Dizziness, headache, chronic diarrhea, general confusion (cannot think clear) 3. What is your BP issue?  Bp has been in this range for about 1 week.  Please advise

## 2017-01-30 NOTE — Telephone Encounter (Signed)
S/w pt he states that his BP last night 173/112 , 149/110 this am. C/o dizziness, headache, chronic diarrhea, general confusion (cannot think clear) . Bp has been in this range for about 1 week. He states that he has had chronic diarrhea for about 2 weeks he has contacted his PCP about this but they cannot get him in until next week(this is before we can fit him in). He will increase his hydration and call them back for appt and their direction.(pt is at the point here that he has been directed to have PCP follow his BP medication-f/u PRN per last note). Pt denies any CP or pressure, nausea or vomiting, etc... He verbalizes understaning if any new symptoms develop verbalizes understanding to go to the ER.

## 2017-07-16 ENCOUNTER — Other Ambulatory Visit: Payer: Self-pay | Admitting: Cardiovascular Disease

## 2017-07-16 NOTE — Telephone Encounter (Signed)
Please review for refill, Thanks !  

## 2017-10-27 ENCOUNTER — Ambulatory Visit: Payer: BLUE CROSS/BLUE SHIELD | Admitting: Physician Assistant

## 2017-10-27 ENCOUNTER — Encounter: Payer: Self-pay | Admitting: Physician Assistant

## 2017-10-27 VITALS — BP 130/86 | HR 60 | Ht 72.0 in | Wt 185.0 lb

## 2017-10-27 DIAGNOSIS — R0789 Other chest pain: Secondary | ICD-10-CM

## 2017-10-27 DIAGNOSIS — I10 Essential (primary) hypertension: Secondary | ICD-10-CM

## 2017-10-27 NOTE — Patient Instructions (Signed)
Medication Instructions:  Continue current medications  If you need a refill on your cardiac medications before your next appointment, please call your pharmacy.  Labwork: None Ordered  Testing/Procedures: None Ordered  Follow-Up: Your physician wants you to follow-up in: 6 Months with Dr Oval Linsey. You should receive a reminder letter in the mail two months in advance. If you do not receive a letter, please call our office [phone].    Thank you for choosing [facility] at [facility]!!

## 2017-10-27 NOTE — Progress Notes (Signed)
Cardiology Office Note   Date:  10/27/2017   ID:  Sean Mckay, DOB 1965/12/08, MRN [id number]  PCP:  Dyann Ruddle, MD  Cardiologist: Dr. Oval Linsey, 05/08/2016 Rosaria Ferries, PA-C   Chief Complaint  Patient presents with  . Follow-up  . Shortness of Breath  . Chest Pain  . Headache    History of Present Illness: Sean Mckay is a 52 y.o. male with a history of HTN, HLD, adrenal adenoma PACs and PVCs, brother died of sudden cardiac death, parents and grandparents with early coronary artery disease  09/30/2017, his BP was very high. He felt SOB, a lump in his throat, sinus pressure and had a fullness in his head. He also had L hand numbness and a pinching pain in his upper L back.   Sean Mckay presents for cardiology follow up.   Occasionally, he will have chest discomfort, aching in his chest in one spot. Not related to meals or position. It is not exertional, but may be related to stress, which also elevates his BP.  He does not recall any symptoms associated with physical stress.  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  The emotional stress makes his BP higher and he feels that is what gives him discomfort.  He can crawl under houses and go up on ladders etc. without any symptoms at all.  When he visits his in-laws, his BP is much better, even though his activity level is much higher.   He saw his PCP in March, did a 24 hour urine test after that visit, no results yet.   Past Medical History:  Diagnosis Date  . Adrenal adenoma 05/09/2016  . Essential hypertension 05/09/2016  . Hyperlipidemia   . Hypertriglyceridemia 05/09/2016    Past Surgical History:  Procedure Laterality Date  . HERNIA REPAIR  02/22/09   RIH    Current Outpatient Medications  Medication Sig Dispense Refill  . cloNIDine (CATAPRES) 0.1 MG tablet Take 1 tablet by mouth daily.    Marland Kitchen levothyroxine (SYNTHROID,  LEVOTHROID) 112 MCG tablet Take 1 tablet by mouth daily.  5  . Nebivolol HCl (BYSTOLIC PO) Take 10 mg by mouth daily.     . valsartan (DIOVAN) 80 MG tablet Take 1 tablet (80 mg total) by mouth daily. 90 tablet 1  . vitamin E 200 UNIT capsule Take 200 Units by mouth daily.    . DOCOSAHEXAENOIC ACID PO Take 1 g by mouth as needed.     No current facility-administered medications for this visit.     Allergies:   Hydralazine; Lisinopril; Norvasc [amlodipine besylate]; Prednisone; Promethazine hcl; and Statins    Social History:  The patient  reports that he has never smoked. He has never used smokeless tobacco. He reports that he does not drink alcohol or use drugs.   Family History:  The patient's family history includes Aneurysm in his maternal grandmother; Cancer in his father and paternal grandmother; Heart attack in his father, maternal grandfather, and mother; Stroke in his maternal grandmother; Sudden death in his brother.    ROS:  Please see the history of present illness. All other systems are reviewed and negative.    PHYSICAL EXAM: VS:  BP 130/86 (BP Location: Left Arm, Patient Position: Sitting, Cuff Size: Normal)   Pulse 60   Ht 6' (1.829 m)   Wt 185 lb (83.9 kg)   BMI 25.09 kg/m  , BMI Body mass  index is 25.09 kg/m. GEN: Well nourished, well developed, male in no acute distress  HEENT: normal for age  Neck: no JVD, no carotid bruit, no masses Cardiac: RRR; no murmur, no rubs, or gallops Respiratory:  clear to auscultation bilaterally, normal work of breathing GI: soft, nontender, nondistended, + BS MS: no deformity or atrophy; no edema; distal pulses are 2+ in all 4 extremities   Skin: warm and dry, no rash Neuro:  Strength and sensation are intact Psych: euthymic mood, full affect   EKG:  EKG is ordered today. The ekg ordered today demonstrates sinus rhythm, heart rate 60, no acute ischemic changes although some questionable early re-pol, normal intervals, no  change from 2017 ECG  Lexiscan Myoview 12/03/15:  LVEF 61%.  No ischemia.  Carotid artery Doppler 12/05/15: 1-39% ICA stenosis bilaterally  Echo 12/03/15:  Normal LV size and function.  Mild MR, trace TR.   Cardiac CT, 04/15/2016 IMPRESSION: 1. Coronary calcium score of 0. This was 0 percentile for age and sex matched control. 2. Normal coronary origin with right dominance. 3. No evidence of CAD. 4. Mildly dilated pulmonary artery measuring 31 x 27 mm suspicious for pulmonary hypertension.   Recent Labs: No results found for requested labs within last 8760 hours.    Lipid Panel No results found for: CHOL, TRIG, HDL, CHOLHDL, VLDL, LDLCALC, LDLDIRECT   Wt Readings from Last 3 Encounters:  10/27/17 185 lb (83.9 kg)  05/08/16 191 lb 9.6 oz (86.9 kg)  04/07/16 191 lb (86.6 kg)     Other studies Reviewed: Additional studies/ records that were reviewed today include: Office notes, hospital records and testing.  ASSESSMENT AND PLAN:  1.  Chest pain: He went to the emergency room and his cardiac enzymes were negative.  His symptoms resolved with improvement in his blood pressure. -I reviewed previous testing results with him. -I explained that at this time, since he has symptoms associated with very high blood pressure and stress level, I do not feel an urgent need to do further testing. -If he has recurrent symptoms, start with an echocardiogram.  With the family history of sudden death in his brother, would also look for RV dysplasia. -If further testing is needed, get a calcium score.  If this is still 0, no further testing needs to be done.  2.  CRF: I advised that his blood pressure goal should be the same as those for people with coronary artery disease. -Goal blood pressure less than 130/80 -Goal LDL less than 70   Current medicines are reviewed at length with the patient today.  The patient does not have concerns regarding medicines.  The following changes have  been made:  no change  Labs/ tests ordered today include:  No orders of the defined types were placed in this encounter.    Disposition:   FU with Dr. Oval Linsey in 6 months  Signed, Rosaria Ferries, PA-C  10/27/2017 10:10 AM    [place] Phone: [phone]; Fax: [phone]  This note was written with the assistance of speech recognition software. Please excuse any transcriptional errors.

## 2017-11-02 ENCOUNTER — Encounter: Payer: Self-pay | Admitting: Physician Assistant

## 2018-04-01 ENCOUNTER — Encounter: Payer: Self-pay | Admitting: Cardiovascular Disease

## 2018-04-01 ENCOUNTER — Ambulatory Visit: Payer: BC Managed Care – PPO | Admitting: Cardiovascular Disease

## 2018-04-01 VITALS — BP 122/81 | HR 53 | Ht 72.0 in | Wt 184.0 lb

## 2018-04-01 DIAGNOSIS — R0789 Other chest pain: Secondary | ICD-10-CM

## 2018-04-01 DIAGNOSIS — R5383 Other fatigue: Secondary | ICD-10-CM | POA: Diagnosis not present

## 2018-04-01 DIAGNOSIS — R0602 Shortness of breath: Secondary | ICD-10-CM | POA: Diagnosis not present

## 2018-04-01 DIAGNOSIS — R42 Dizziness and giddiness: Secondary | ICD-10-CM

## 2018-04-01 DIAGNOSIS — R001 Bradycardia, unspecified: Secondary | ICD-10-CM | POA: Diagnosis not present

## 2018-04-01 NOTE — Patient Instructions (Addendum)
Medication Instructions:  Your physician recommends that you continue on your current medications as directed. Please refer to the Current Medication list given to you today.  Labwork: ESR/TSH TODAY   Testing/Procedures: Your physician has recommended that you wear a holter monitor. Holter monitors are medical devices that record the heart's electrical activity. Doctors most often use these monitors to diagnose arrhythmias. Arrhythmias are problems with the speed or rhythm of the heartbeat. The monitor is a small, portable device. You can wear one while you do your normal daily activities. This is usually used to diagnose what is causing palpitations/syncope (passing out).  Your physician has requested that you have an echocardiogram. Echocardiography is a painless test that uses sound waves to create images of your heart. It provides your doctor with information about the size and shape of your heart and how well your heart's chambers and valves are working. This procedure takes approximately one hour. There are no restrictions for this procedure.  BOTH OF THE ABOVE WILL BE DONE AT [facility] AT [place] [address]  Follow-Up: Your physician recommends that you schedule a follow-up appointment in: 6 WEEKS  If you need a refill on your cardiac medications before your next appointment, please call your pharmacy.  Echocardiogram An echocardiogram, or echocardiography, uses sound waves (ultrasound) to produce an image of your heart. The echocardiogram is simple, painless, obtained within a short period of time, and offers valuable information to your health care provider. The images from an echocardiogram can provide information such as:  Evidence of coronary artery disease (CAD).  Heart size.  Heart muscle function.  Heart valve function.  Aneurysm detection.  Evidence of a past heart attack.  Fluid buildup around the heart.  Heart muscle thickening.  Assess heart valve  function.  Tell a health care provider about:  Any allergies you have.  All medicines you are taking, including vitamins, herbs, eye drops, creams, and over-the-counter medicines.  Any problems you or family members have had with anesthetic medicines.  Any blood disorders you have.  Any surgeries you have had.  Any medical conditions you have.  Whether you are pregnant or may be pregnant. What happens before the procedure? No special preparation is needed. Eat and drink normally. What happens during the procedure?  In order to produce an image of your heart, gel will be applied to your chest and a wand-like tool (transducer) will be moved over your chest. The gel will help transmit the sound waves from the transducer. The sound waves will harmlessly bounce off your heart to allow the heart images to be captured in real-time motion. These images will then be recorded.  You may need an IV to receive a medicine that improves the quality of the pictures. What happens after the procedure? You may return to your normal schedule including diet, activities, and medicines, unless your health care provider tells you otherwise. This information is not intended to replace advice given to you by your health care provider. Make sure you discuss any questions you have with your health care provider. Document Released: 06/27/2000 Document Revised: 02/16/2016 Document Reviewed: 03/07/2013 Elsevier Interactive Patient Education  2017 [employer].  Holter Monitoring A Holter monitor is a small device that is used to detect abnormal heart rhythms. It clips to your clothing and is connected by wires to flat, sticky disks (electrodes) that attach to your chest. It is worn continuously for 24-48 hours. Follow these instructions at home:  Wear your Holter  monitor at all times, even while exercising and sleeping, for as long as directed by your health care provider.  Make sure that the Holter monitor is  safely clipped to your clothing or close to your body as recommended by your health care provider.  Do not get the monitor or wires wet.  Do not put body lotion or moisturizer on your chest.  Keep your skin clean.  Keep a diary of your daily activities, such as walking and doing chores. If you feel that your heartbeat is abnormal or that your heart is fluttering or skipping a beat: ? Record what you are doing when it happens. ? Record what time of day the symptoms occur.  Return your Holter monitor as directed by your health care provider.  Keep all follow-up visits as directed by your health care provider. This is important. Get help right away if:  You feel lightheaded or you faint.  You have trouble breathing.  You feel pain in your chest, upper arm, or jaw.  You feel sick to your stomach and your skin is pale, cool, or damp.  You heartbeat feels unusual or abnormal. This information is not intended to replace advice given to you by your health care provider. Make sure you discuss any questions you have with your health care provider. Document Released: 03/28/2004 Document Revised: 12/06/2015 Document Reviewed: 02/06/2014 Elsevier Interactive Patient Education  [employer].

## 2018-04-01 NOTE — Progress Notes (Signed)
Cardiology Office Note   Date:  04/01/2018   ID:  Sean Mckay, DOB Dec 06, 1965, MRN [id number]  PCP:  Sean Ruddle, MD  Cardiologist:   Sean Latch, MD   Chief Complaint  Patient presents with  . Follow-up  . Shortness of Breath    constantly  . Dizziness    constantly.    History of Present Illness: Sean Mckay is a 52 y.o. male with hypertension and hyperlipidemia who presents for follow up.  Sean Mckay was first seen 12/2015 due to poorly-controlled hypertension.  Sean Mckay has struggled with poorly-controlled hypertension and intolerance to many medications.  He had renal artery Dopplers that were negative for renal artery stenosis.  He also had carotid Dopplers due to an episode of syncope that were negative for obstruction.  He also had a nuclear stress test that was negative for ischemia. He reports wearing a 48-hour Holter years ago that showed PACs and PVCs.  Of note, Sean Mckay has a brother who died of sudden cardiac death at age 54. No autopsy was performed.  Both his mother and father have had heart attacks and bypass surgery. Maternal grandfather had a heart attack at age 77.    At his June appointment Sean Mckay was started on HCTZ  12.5 mg daily.  However he did not tolerate this and was started on valsartan, which was subsequently increased to 80 mg due to poor BP control.  He developed fatigue and shortness of breath on nebivolol 65m daily, so this was reduced.  Since making that change his blood pressure has been well-controlled.  At his last appointment 03/2016 he reported episodes of sharp, atypical chest pain.  Given this as well as his family history of CAD and borderline lipids, he was referred for cardiac CT-A.   He was found to have a coronary calcium score of 0.  His pulmonary artery was noted to be mildly dilated to 3.1 x 2.7 cm.   Adverse effects:  Amlodipine: syncope Lisinopril: dizziness Hydralazine: Muscle aches,  fatigue Nifedipine: fatigue, shortness of breath HCTZ: thirst, cough, tremor Nebivolol 20 mg: fatigue, shortness of breath.  OK on 10 mg.  For the last couple months Mr. BEffingerhas been experiencing intermittent episodes of chest pain.  He has sharp, shooting pain in his chest.  The episodes last for up to 30 minutes at a time and feels like either a sharp pain or a cramping in his chest muscles.  There is no associated nausea or diaphoresis.  They are not associated with exertion.  Over the same time.  He has been feeling constantly short of breath.  He notes that when sitting and sometimes feels as though he has a hard time taking a deep breath.  When he tries to exert himself doing things such as mowing the lawn with his push mower he notes that he has to stop more frequently than in the past due to shortness of breath.  He does not have exertional chest pain or tightness.  He has not noted any lower extremity edema, orthopnea, or PND.  He denies any recent upper respiratory infections.  He also reports intermittent episodes of dizziness.  When lying in bed he felt lightheaded and as though he might pass out.  The episodes do not consistently happen with changes in position or with movements of his head.  That same evening when he had dizziness lying in bed he tried to read his iPad and noted that his  eyes were blurry and that he was seeing spots.  He checked his blood pressure at that time and it was reportedly normal.  Overall his blood pressure has been well-controlled.  He has been mostly in the 120s to low 130s.  He no longer has large spikes in his blood pressure.  Sean Mckay followed up with his PCP regarding the symptoms.  She checked a chest x-ray that showed no acute findings but did note some old scarring in his left lung.  He thinks this is due to a prior pneumonia.  She also checked a d-dimer that was normal.  Of note, he had a sleep study in 2017 that was negative for sleep  apnea.   Past Medical History:  Diagnosis Date  . Adrenal adenoma 05/09/2016  . Essential hypertension 05/09/2016  . Hyperlipidemia   . Hypertriglyceridemia 05/09/2016    Past Surgical History:  Procedure Laterality Date  . HERNIA REPAIR  02/22/09   [facility]     Current Outpatient Medications  Medication Sig Dispense Refill  . cloNIDine (CATAPRES) 0.1 MG tablet Take 1 tablet by mouth daily.    Marland Kitchen levothyroxine (SYNTHROID, LEVOTHROID) 112 MCG tablet Take 1 tablet by mouth daily.  5  . loperamide (IMODIUM A-D) 2 MG tablet Take 2 mg by mouth 2 (two) times daily.    . Nebivolol HCl (BYSTOLIC PO) Take 10 mg by mouth daily.     Marland Kitchen omega-3 acid ethyl esters (LOVAZA) 1 g capsule Take by mouth daily.    . valsartan (DIOVAN) 80 MG tablet Take 1 tablet (80 mg total) by mouth daily. 90 tablet 1  . vitamin E 200 UNIT capsule Take 200 Units by mouth daily.     No current facility-administered medications for this visit.     Allergies:   Hydralazine; Lisinopril; Norvasc [amlodipine besylate]; Prednisone; Promethazine hcl; and Statins    Social History:  The patient  reports that he has never smoked. He has never used smokeless tobacco. He reports that he does not drink alcohol or use drugs.   Family History:  The patient's family history includes Aneurysm in his maternal grandmother; Cancer in his father and paternal grandmother; Heart attack in his father, maternal grandfather, and mother; Stroke in his maternal grandmother; Sudden death in his brother.    ROS:  Please see the history of present illness.   Otherwise, review of systems are positive for none.   All other systems are reviewed and negative.    PHYSICAL EXAM: VS:  BP 122/81   Pulse (!) 53   Ht 6' (1.829 m)   Wt 184 lb (83.5 kg)   BMI 24.95 kg/m  , BMI Body mass index is 24.95 kg/m. GENERAL:  Well appearing HEENT: Pupils equal round and reactive, fundi not visualized, oral mucosa unremarkable NECK:  No jugular venous  distention, waveform within normal limits, carotid upstroke brisk and symmetric, no bruits LUNGS:  Clear to auscultation bilaterally HEART:  RRR.  PMI not displaced or sustained,S1 and S2 within normal limits, no S3, no S4, no clicks, no rubs, no murmurs ABD:  Flat, positive bowel sounds normal in frequency in pitch, no bruits, no rebound, no guarding, no midline pulsatile mass, no hepatomegaly, no splenomegaly EXT:  2 plus pulses throughout, no edema, no cyanosis no clubbing SKIN:  No rashes no nodules NEURO:  Cranial nerves II through XII grossly intact, motor grossly intact throughout PSYCH:  Cognitively intact, oriented to person place and time    EKG:  EKG  is ordered today. The ekg ordered 12/24/15 demonstrates Sinus bradycardia rate 54 bpm.  R axis deviation.  04/01/2018: Sinus bradycardia.  Rate 53 bpm.  Less than 1 mm ST elevation inferiorly, anteriorly, and laterally.  Lexiscan Myoview 12/03/15:  LVEF 61%.  No ischemia.  Carotid artery Doppler 12/05/15: 1-39% ICA stenosis bilaterally  Echo 12/03/15:  Normal LV size and function.  Mild MR, trace TR.   Recent Labs: No results found for requested labs within last 8760 hours.    Lipid Panel No results found for: CHOL, TRIG, HDL, CHOLHDL, VLDL, LDLCALC, LDLDIRECT 12/02/15: Total cholesterol 199, triglycerides 180 HDL 36, LDL 126  Wt Readings from Last 3 Encounters:  04/01/18 184 lb (83.5 kg)  10/27/17 185 lb (83.9 kg)  05/08/16 191 lb 9.6 oz (86.9 kg)      ASSESSMENT AND PLAN:  # Chest/back pain: Mr. Fennel's chest pain is extremely atypical.  It never happens with exertion and is either sharp or muscle cramping.  Given his coronary calcium score of 0 and no CAD noted on CT-a 04/2016, it is extremely unlikely that this is due to ischemia.  Therefore we will not repeat an ischemia evaluation at this time.  # Shortness of breath:  There is no evidence of volume overload on exam.  He denies any recent upper respiratory  infections.  EKG has some nonspecific ST elevations.  I wonder if this could be due to pericarditis, especially given his difficulty taking a deep breath.  We will get an echocardiogram.  We will also check an ESR.  # Bradycardia:  # Dizziness: Heart rate is 53 today.  It is unclear whether his bradycardia is contributing to his dizziness.  It occurred when lying down and not when changing positions.  However he has also noted some slightly irregularity to his heart rhythm.  He has multiple medication intolerances and seems to otherwise be tolerating his current regimen.  Ideally we would not stop his nebivolol if possible.  We will get a 48-hour Holter to assess for whether his symptoms are correlate with bradycardia or any irregular heart rhythms.  Check TSH.  # Hypertension  Blood pressure is well-controlled.  Continue nebivolol, valsartan, and clonidine.   # Hypertriglyceridemia: # Hyperlipidemia:  ASCVD 10 year risk is 6.1%.  Therefore, no statin, especially given his normal coronaries on CT.  Continue diet and exercise.  # Snoring: Sleep study negative for sleep apnea.   Current medicines are reviewed at length with the patient today.  The patient does not have concerns regarding medicines.  The following changes have been made:  None  Labs/ tests ordered today include:    Orders Placed This Encounter  Procedures  . TSH  . Sed Rate (ESR)  . Holter monitor - 48 hour  . EKG 12-Lead  . ECHOCARDIOGRAM COMPLETE    Disposition:   FU with Magnolia Mattila C. Oval Linsey, MD, [facility] as needed.     Signed, Ervine Witucki C. Oval Linsey, MD, [facility]  04/01/2018 1:06 PM    [facility]

## 2018-04-02 LAB — TSH: TSH: 1.53 u[IU]/mL (ref 0.450–4.500)

## 2018-04-02 LAB — SEDIMENTATION RATE: SED RATE: 13 mm/h (ref 0–30)

## 2018-04-07 ENCOUNTER — Encounter: Payer: Self-pay | Admitting: *Deleted

## 2018-04-07 ENCOUNTER — Telehealth: Payer: Self-pay | Admitting: Cardiovascular Disease

## 2018-04-07 NOTE — Telephone Encounter (Signed)
Message sent to precert department

## 2018-04-07 NOTE — Telephone Encounter (Signed)
New Message:    Patient is requesting to have the [facility] at  [facility].    An authorization is required  Ph: [phone]     Tax: [id number] NPI: [id number]

## 2018-04-08 ENCOUNTER — Ambulatory Visit: Payer: BC Managed Care – PPO | Admitting: Cardiovascular Disease

## 2018-04-08 ENCOUNTER — Encounter: Payer: Self-pay | Admitting: *Deleted

## 2018-04-13 ENCOUNTER — Ambulatory Visit (INDEPENDENT_AMBULATORY_CARE_PROVIDER_SITE_OTHER): Payer: BC Managed Care – PPO

## 2018-04-13 ENCOUNTER — Other Ambulatory Visit (HOSPITAL_COMMUNITY): Payer: BC Managed Care – PPO

## 2018-04-13 DIAGNOSIS — R0602 Shortness of breath: Secondary | ICD-10-CM

## 2018-04-13 DIAGNOSIS — R001 Bradycardia, unspecified: Secondary | ICD-10-CM | POA: Diagnosis not present

## 2018-04-13 DIAGNOSIS — R5383 Other fatigue: Secondary | ICD-10-CM

## 2018-04-14 NOTE — Telephone Encounter (Signed)
mychart message sent to patient this has been precerted and can get scheduled

## 2018-04-26 ENCOUNTER — Ambulatory Visit: Payer: BC Managed Care – PPO | Admitting: Cardiovascular Disease

## 2018-04-26 ENCOUNTER — Encounter: Payer: Self-pay | Admitting: *Deleted

## 2018-04-26 ENCOUNTER — Encounter: Payer: Self-pay | Admitting: Cardiovascular Disease

## 2018-04-26 VITALS — BP 120/80 | HR 66 | Ht 72.0 in | Wt 185.6 lb

## 2018-04-26 DIAGNOSIS — E7800 Pure hypercholesterolemia, unspecified: Secondary | ICD-10-CM | POA: Diagnosis not present

## 2018-04-26 DIAGNOSIS — R0602 Shortness of breath: Secondary | ICD-10-CM

## 2018-04-26 DIAGNOSIS — R0789 Other chest pain: Secondary | ICD-10-CM

## 2018-04-26 MED ORDER — EZETIMIBE 10 MG PO TABS: 10.0000 mg | ORAL_TABLET | Freq: Every day | ORAL | 3 refills | Status: DC

## 2018-04-26 NOTE — Progress Notes (Signed)
Cardiology Office Note   Date:  04/26/2018   ID:  ETAN VASUDEVAN, DOB 12/08/1965, MRN [id number]  PCP:  Dyann Ruddle, MD  Cardiologist:   Skeet Latch, MD   No chief complaint on file.   History of Present Illness: ASENCION LOVEDAY is a 52 y.o. male with hypertension and hyperlipidemia who presents for follow up.  Mr. Kerschner was first seen 12/2015 due to poorly-controlled hypertension.  He has struggled with poorly-controlled hypertension and intolerance to many medications.  He had renal artery Dopplers that were negative for renal artery stenosis.  He also had carotid Dopplers due to an episode of syncope that were negative for obstruction.  He also had a nuclear stress test that was negative for ischemia. He reports wearing a 48-hour Holter years ago that showed PACs and PVCs.  Of note, Mr. Hyson has a brother who died of sudden cardiac death at age 36. No autopsy was performed.  Both his mother and father have had heart attacks and bypass surgery. Maternal grandfather had a heart attack at age 19.    Mr. Kock was started on HCTZ  12.5 mg daily.  However he did not tolerate this and was started on valsartan, which was subsequently increased to 80 mg due to poor BP control.  He developed fatigue and shortness of breath on nebivolol 20mg  daily, so this was reduced.  Since making that change his blood pressure has been well-controlled.  At his last appointment 03/2016 he reported episodes of sharp, atypical chest pain.  Given this as well as his family history of CAD and borderline lipids, he was referred for cardiac CT-A.   He was found to have a coronary calcium score of 0.  His pulmonary artery was noted to be mildly dilated to 3.1 x 2.7 cm.   Adverse effects:  Amlodipine: syncope Lisinopril: dizziness Hydralazine: Muscle aches, fatigue Nifedipine: fatigue, shortness of breath HCTZ: thirst, cough, tremor Nebivolol 20 mg: fatigue, shortness of breath.  OK on 10  mg.  At his last appointment Mr. Delap reported intermittent episodes of chest pain.  He had sharp, shooting pain in his chest.  The episodes lasted for up to 30 minutes at a time and felt like either a sharp pain or a cramping in his chest muscles.  There is no associated nausea or diaphoresis.  They are not associated with exertion.  He also reported difficulty taking a deep breath.  Mr. Bunyard followed up with his PCP regarding the symptoms.  She checked a chest x-ray that showed no acute findings but did note some old scarring in his left lung.  He thinks this is due to a prior pneumonia.  She also checked a d-dimer that was normal.  Of note, he had a sleep study in 2017 that was negative for sleep apnea.  Given that there wa no CAD noted on coronary CT-A and he had a calcium score of 0 in 04/2016 it was felt that his symptoms were not related to ischemia.  He also reported palpitations so he was referred for 48-hour Holter monitor that showed PACs and PVCs but no significant arrhythmias.  He was referred for an echo 04/13/18 that showed normal systolic function and trivial TR but was otherwise unremarkable.  Since his last appointment he continues to have episodes of dizziness a couple times per day.  He feels short of breath at rest and gets worse with exertion.  He has no lower extremity edema, orthopnea, or PND.  He notes that he has had high cholesterol for many years.  He previously tried statins and did not tolerate them.   Past Medical History:  Diagnosis Date  . Adrenal adenoma 05/09/2016  . Essential hypertension 05/09/2016  . Hyperlipidemia   . Hypertriglyceridemia 05/09/2016    Past Surgical History:  Procedure Laterality Date  . HERNIA REPAIR  02/22/09   RIH     Current Outpatient Medications  Medication Sig Dispense Refill  . cloNIDine (CATAPRES) 0.1 MG tablet Take 1 tablet by mouth daily as needed.     Marland Kitchen levothyroxine (SYNTHROID, LEVOTHROID) 112 MCG tablet Take 1 tablet  by mouth daily.  5  . loperamide (IMODIUM A-D) 2 MG tablet Take 2 mg by mouth 2 (two) times daily.    . Nebivolol HCl (BYSTOLIC PO) Take 10 mg by mouth daily.     Marland Kitchen omega-3 acid ethyl esters (LOVAZA) 1 g capsule Take by mouth daily.    . valsartan (DIOVAN) 80 MG tablet Take 1 tablet (80 mg total) by mouth daily. 90 tablet 1  . vitamin E 200 UNIT capsule Take 200 Units by mouth daily.    Marland Kitchen ezetimibe (ZETIA) 10 MG tablet Take 1 tablet (10 mg total) by mouth daily. 90 tablet 3   No current facility-administered medications for this visit.     Allergies:   Hydralazine; Lisinopril; Norvasc [amlodipine besylate]; Prednisone; Promethazine hcl; Statins; Ezetimibe-simvastatin; and Promethazine    Social History:  The patient  reports that he has never smoked. He has never used smokeless tobacco. He reports that he does not drink alcohol or use drugs.   Family History:  The patient's family history includes Aneurysm in his maternal grandmother; Cancer in his father and paternal grandmother; Heart attack in his father, maternal grandfather, and mother; Stroke in his maternal grandmother; Sudden death in his brother.    ROS:  Please see the history of present illness.   Otherwise, review of systems are positive for none.   All other systems are reviewed and negative.    PHYSICAL EXAM: VS:  BP 120/80   Pulse 66   Ht 6' (1.829 m)   Wt 185 lb 9.6 oz (84.2 kg)   BMI 25.17 kg/m  , BMI Body mass index is 25.17 kg/m. GENERAL:  Well appearing HEENT: Pupils equal round and reactive, fundi not visualized, oral mucosa unremarkable NECK:  No jugular venous distention, waveform within normal limits, carotid upstroke brisk and symmetric, no bruits, no thyromegaly LYMPHATICS:  No cervical adenopathy LUNGS:  Clear to auscultation bilaterally HEART:  RRR.  PMI not displaced or sustained,S1 and S2 within normal limits, no S3, no S4, no clicks, no rubs, no murmurs ABD:  Flat, positive bowel sounds normal in  frequency in pitch, no bruits, no rebound, no guarding, no midline pulsatile mass, no hepatomegaly, no splenomegaly EXT:  2 plus pulses throughout, no edema, no cyanosis no clubbing SKIN:  No rashes no nodules NEURO:  Cranial nerves II through XII grossly intact, motor grossly intact throughout PSYCH:  Cognitively intact, oriented to person place and time   EKG:  EKG is ordered today. The ekg ordered 12/24/15 demonstrates Sinus bradycardia rate 54 bpm.  R axis deviation.  04/01/2018: Sinus bradycardia.  Rate 53 bpm.  Less than 1 mm ST elevation inferiorly, anteriorly, and laterally.  Lexiscan Myoview 12/03/15:  LVEF 61%.  No ischemia.  Carotid artery Doppler 12/05/15: 1-39% ICA stenosis bilaterally  Echo 12/03/15:  Normal LV size and function.  Mild MR, trace  TR.   Echo 04/13/18: LVEF 55-60%.  Trivial TR.   48 Hour Event Monitor 04/13/18:  Quality: Fair.  Baseline artifact. Predominant rhythm: sinus rhythm Average heart rate: 67 bpm Max heart rate: 120 bpm Min heart rate: 47 bpm  Rare PVCs and occasional PACs 3 beats of NSVT  Patient reported episodes of chest pain and jaw pain, at which time sinus rhythm was noted.   Recent Labs: 04/01/2018: TSH 1.530    Lipid Panel No results found for: CHOL, TRIG, HDL, CHOLHDL, VLDL, LDLCALC, LDLDIRECT 12/02/15: Total cholesterol 199, triglycerides 180 HDL 36, LDL 126  12/18:  TC 246 197 LDL, HDL 36, tri 194    Wt Readings from Last 3 Encounters:  04/26/18 185 lb 9.6 oz (84.2 kg)  04/01/18 184 lb (83.5 kg)  10/27/17 185 lb (83.9 kg)      ASSESSMENT AND PLAN:  # Chest/back pain: Mr. Scheel's chest pain is extremely atypical.  It never happens with exertion and is either sharp or muscle cramping.  Given his coronary calcium score of 0 and no CAD noted on CT-a 04/2016, it is extremely unlikely that this is due to ischemia.  We will get a CPX.  # Shortness of breath:  There is no evidence of volume overload on exam.  He denies any  recent upper respiratory infections.  CXR unremarkable and echo without evidence of heart failure.  We will get a CPX to better assess for deconditioning, chronotropic incompentence, cardiac or pulmonary causes.  # Bradycardia:  # Dizziness: Symptoms did not correlate with bradycardia on Holter.  # Hypertension  Blood pressure is well-controlled.  Continue nebivolol, valsartan, and clonidine.   # Hypertriglyceridemia: # Hyperlipidemia: Recent LDL was 197.  He has tried many statins in the past and did not tolerate them he is not willing to try again.  We will try Zetia 10 mg.  Repeat lipids and CMP with his PCP in 2 months.  # Snoring: Sleep study negative for sleep apnea.   Current medicines are reviewed at length with the patient today.  The patient does not have concerns regarding medicines.  The following changes have been made:  None  Labs/ tests ordered today include:    Orders Placed This Encounter  Procedures  . Exercise Tolerance Test    Disposition:   FU with Rudi Knippenberg C. Oval Linsey, MD, [facility] in 2 months.     Signed, Sherrell Weir C. Oval Linsey, MD, [facility]  04/26/2018 10:26 AM    [facility]

## 2018-04-26 NOTE — Patient Instructions (Signed)
Medication Instructions:  START ZETIA 10 MG DAILY   If you need a refill on your cardiac medications before your next appointment, please call your pharmacy.   Lab work: FASTING LIPIDS AT [facility] UP WITH PRIMARY CARE   Testing/Procedures: Your physician has requested that you have an exercise tolerance test. For further information please visit [email or web address]. Please also follow instruction sheet, as given.  Follow-Up: At [facility], you and your health needs are our priority.  As part of our continuing mission to provide you with exceptional heart care, we have created designated Provider Care Teams.  These Care Teams include your primary Cardiologist (physician) and Advanced Practice Providers (APPs -  Physician Assistants and Nurse Practitioners) who all work together to provide you with the care you need, when you need it. You will need a follow up appointment in 2 months.  You may see Skeet Latch, MD or one of the following Advanced Practice Providers on your designated Care Team:   [name], PA-C Roby Lofts, [place] . Sande Rives, PA-C  Any Other Special Instructions Will Be Listed Below (If Applicable).  Exercise Stress Electrocardiogram An exercise stress electrocardiogram is a test to check how blood flows to your heart. It is done to find areas of poor blood flow. You will need to walk on a treadmill for this test. The electrocardiogram will record your heartbeat when you are at rest and when you are exercising. What happens before the procedure?  Do not have drinks with caffeine or foods with caffeine for 24 hours before the test, or as told by your doctor. This includes coffee, tea (even decaf tea), sodas, chocolate, and cocoa.  Follow your doctor's instructions about eating and drinking before the test.  Ask your doctor what medicines you should or should not take before the test. Take your medicines with water unless told by your doctor not  to.  If you use an inhaler, bring it with you to the test.  Bring a snack to eat after the test.  Do not  smoke for 4 hours before the test.  Do not put lotions, powders, creams, or oils on your chest before the test.  Wear comfortable shoes and clothing. What happens during the procedure?  You will have patches put on your chest. Small areas of your chest may need to be shaved. Wires will be connected to the patches.  Your heart rate will be watched while you are resting and while you are exercising.  You will walk on the treadmill. The treadmill will slowly get faster to raise your heart rate.  The test will take about 1-2 hours. What happens after the procedure?  Your heart rate and blood pressure will be watched after the test.  You may return to your normal diet, activities, and medicines or as told by your doctor. This information is not intended to replace advice given to you by your health care provider. Make sure you discuss any questions you have with your health care provider. Document Released: 12/17/2007 Document Revised: 02/27/2016 Document Reviewed: 03/07/2013 Elsevier Interactive Patient Education  [employer].

## 2018-05-05 ENCOUNTER — Ambulatory Visit: Payer: BC Managed Care – PPO

## 2018-05-05 ENCOUNTER — Telehealth: Payer: Self-pay | Admitting: *Deleted

## 2018-05-05 DIAGNOSIS — R0602 Shortness of breath: Secondary | ICD-10-CM

## 2018-05-05 NOTE — Telephone Encounter (Signed)
When patient seen in the office 04/26/18 by Dr Oval Linsey she wanted CPX and GXT ordered in error. Order placed for CPX and message sent to scheduling. Spoke to patient and advised someone in scheduling would be in contact soon

## 2018-05-12 ENCOUNTER — Ambulatory Visit (HOSPITAL_COMMUNITY): Payer: BC Managed Care – PPO | Attending: Cardiovascular Disease

## 2018-05-12 ENCOUNTER — Other Ambulatory Visit (HOSPITAL_COMMUNITY): Payer: Self-pay | Admitting: *Deleted

## 2018-05-12 ENCOUNTER — Encounter (HOSPITAL_COMMUNITY): Payer: BC Managed Care – PPO

## 2018-05-12 DIAGNOSIS — R0602 Shortness of breath: Secondary | ICD-10-CM

## 2018-05-12 DIAGNOSIS — R079 Chest pain, unspecified: Secondary | ICD-10-CM | POA: Insufficient documentation

## 2018-05-27 ENCOUNTER — Telehealth: Payer: Self-pay

## 2018-05-27 NOTE — Telephone Encounter (Signed)
I have only seen pt's husband once in July when he established but both were prev followed by same PCP in [place] who retired - sure, willing to see Sherren Mocha as well. Ok to sched visit to establish

## 2018-05-27 NOTE — Telephone Encounter (Signed)
Copied from [place] [id number]. Topic: Appointment Scheduling - Scheduling Inquiry for Clinic >> May 27, 2018  5:41 PM Margot Ables wrote: Reason for CRM: Mr. Sean Mckay is requesting that Dr. Brigitte Pulse accept as a patient. He was referred by his spouse, Sean Mckay, and existing pt with Dr. Brigitte Pulse. Former PCP retired. Please advise  Message sent to Dr. Brigitte Pulse

## 2018-06-16 ENCOUNTER — Telehealth: Payer: Self-pay | Admitting: Family Medicine

## 2018-06-16 NOTE — Telephone Encounter (Signed)
Per Dr. Brigitte Pulse, called pt and offered/made an appt for 07/28/18 at 10:00 AM for a new pt establish care appt. I advised of time, building and late policy. I advised to bring insurance card and photo ID. Pt acknowledged.

## 2018-06-28 ENCOUNTER — Encounter: Payer: Self-pay | Admitting: Cardiology

## 2018-06-28 ENCOUNTER — Ambulatory Visit: Payer: BC Managed Care – PPO | Admitting: Cardiology

## 2018-06-28 DIAGNOSIS — E785 Hyperlipidemia, unspecified: Secondary | ICD-10-CM

## 2018-06-28 DIAGNOSIS — R079 Chest pain, unspecified: Secondary | ICD-10-CM

## 2018-06-28 DIAGNOSIS — Z8241 Family history of sudden cardiac death: Secondary | ICD-10-CM | POA: Diagnosis not present

## 2018-06-28 DIAGNOSIS — I493 Ventricular premature depolarization: Secondary | ICD-10-CM

## 2018-06-28 DIAGNOSIS — I10 Essential (primary) hypertension: Secondary | ICD-10-CM | POA: Diagnosis not present

## 2018-06-28 DIAGNOSIS — Z789 Other specified health status: Secondary | ICD-10-CM

## 2018-06-28 DIAGNOSIS — R0602 Shortness of breath: Secondary | ICD-10-CM

## 2018-06-28 NOTE — Assessment & Plan Note (Signed)
Brother died 54 (no autopsy). Pt's mother and father with a history of CAD

## 2018-06-28 NOTE — Patient Instructions (Signed)
Medication Instructions:  Your physician recommends that you continue on your current medications as directed. Please refer to the Current Medication list given to you today. If you need a refill on your cardiac medications before your next appointment, please call your pharmacy.   Lab work: None  If you have labs (blood work) drawn today and your tests are completely normal, you will receive your results only by: Marland Kitchen MyChart Message (if you have MyChart) OR . A paper copy in the mail If you have any lab test that is abnormal or we need to change your treatment, we will call you to review the results.  Testing/Procedures: None   Follow-Up: At [facility], you and your health needs are our priority.  As part of our continuing mission to provide you with exceptional heart care, we have created designated Provider Care Teams.  These Care Teams include your primary Cardiologist (physician) and Advanced Practice Providers (APPs -  Physician Assistants and Nurse Practitioners) who all work together to provide you with the care you need, when you need it. [facility] RECOMMENDS YOU FOLLOW UP ON A AS NEEDED BASIS.  Any Other Special Instructions Will Be Listed Below (If Applicable).

## 2018-06-28 NOTE — Assessment & Plan Note (Signed)
LDL 150, HDL 40, trig 277- Dec 2019 Statin intolerance

## 2018-06-28 NOTE — Assessment & Plan Note (Signed)
PVCs and PACs on 48 hour Holter Oct 2019

## 2018-06-28 NOTE — Assessment & Plan Note (Signed)
Normal Echo 04/13/18 Negative renal artery dopplers Goal B/P 120/80 or less

## 2018-06-28 NOTE — Progress Notes (Signed)
06/28/2018 Sean Mckay   05-Jan-1966  [id number]  Primary Physician Dyann Ruddle, MD Primary Cardiologist: Dr Oval Linsey  HPI: Sean Mckay is a 52 year old male with a history of chest pain and dyspnea on exertion.  He had a fairly extensive work-up including a negative nuclear stress in May 2017, normal echocardiogram in April 14, 2018, and a negative cardiopulmonary exercise test in October of 219.  He has had prior Holter monitor which showed PACs and PVCs.  He has a history of hypertension and dyslipidemia.  He has a family history of sudden cardiac death and coronary disease.  He has had intolerances to several medications.  He does not smoke.  He saw Dr. Oval Linsey in October and she ordered the cardiopulmonary exercise test.  The patient is followed up today, he just had lipids drawn at his primary care provider's office and he brought those with him as well.  Patient says he continues to be short of breath and is actually getting worse.  He has dyspnea on exertion.  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  He is never had a pulmonary evaluation or any history of asthma.  His lipids show an LDL of 150, HDL 40 and triglycerides of 277.  He also had a borderline elevated hemoglobin A1c.   Current Outpatient Medications  Medication Sig Dispense Refill  . cloNIDine (CATAPRES) 0.1 MG tablet Take 1 tablet by mouth daily as needed.     . ezetimibe (ZETIA) 10 MG tablet Take 1 tablet (10 mg total) by mouth daily. 90 tablet 3  . levothyroxine (SYNTHROID, LEVOTHROID) 112 MCG tablet Take 1 tablet by mouth daily.  5  . loperamide (IMODIUM A-D) 2 MG tablet Take 2 mg by mouth 2 (two) times daily.    . Nebivolol HCl (BYSTOLIC PO) Take 10 mg by mouth daily.     Marland Kitchen omega-3 acid ethyl esters (LOVAZA) 1 g capsule Take by mouth daily.    . valsartan (DIOVAN) 80 MG tablet Take 1 tablet (80 mg total) by mouth daily. 90 tablet 1  . vitamin E 200 UNIT capsule  Take 200 Units by mouth daily.     No current facility-administered medications for this visit.     Allergies  Allergen Reactions  . Hydralazine Other (See Comments)    Fatigue and muscle/joint pain Other reaction(s): Arthralgias (intolerance) Fatigue, myalgias  . Lisinopril Other (See Comments)    Dizziness   . Norvasc [Amlodipine Besylate]     dizziness  . Prednisone Other (See Comments)    "vision problems" Other reaction(s): Other (See Comments), Other (See Comments) Blurred vision Visual problem when taken orally   . Promethazine Hcl   . Statins Other (See Comments)    aches Other reaction(s): Myalgias (intolerance)  . Ezetimibe-Simvastatin Rash  . Promethazine Nausea Only    Other reaction(s): Other (See Comments) Unknown reaction     Past Medical History:  Diagnosis Date  . Adrenal adenoma 05/09/2016  . Essential hypertension 05/09/2016  . Hyperlipidemia   . Hypertriglyceridemia 05/09/2016    Social History   Socioeconomic History  . Marital status: Married    Spouse name: Not on file  . Number of children: Not on file  . Years of education: Not on file  . Highest education level: Not on file  Occupational History  . Occupation: Architect: [employer]  . Financial resource strain: Not on file  .  Food insecurity:    Worry: Not on file    Inability: Not on file  . Transportation needs:    Medical: Not on file    Non-medical: Not on file  Tobacco Use  . Smoking status: Never Smoker  . Smokeless tobacco: Never Used  Substance and Sexual Activity  . Alcohol use: No  . Drug use: No  . Sexual activity: Not on file  Lifestyle  . Physical activity:    Days per week: Not on file    Minutes per session: Not on file  . Stress: Not on file  Relationships  . Social connections:    Talks on phone: Not on file    Gets together: Not on file    Attends religious service: Not on file    Active  member of club or organization: Not on file    Attends meetings of clubs or organizations: Not on file    Relationship status: Not on file  . Intimate partner violence:    Fear of current or ex partner: Not on file    Emotionally abused: Not on file    Physically abused: Not on file    Forced sexual activity: Not on file  Other Topics Concern  . Not on file  Social History Narrative  . Not on file     Family History  Problem Relation Age of Onset  . Cancer Father        leukemia  . Heart attack Father   . Heart attack Mother   . Cancer Paternal Grandmother        colon  . Sudden death Brother   . Heart attack Maternal Grandfather   . Stroke Maternal Grandmother   . Aneurysm Maternal Grandmother      Review of Systems: General: negative for chills, fever, night sweats or weight changes.  Cardiovascular: negative for chest pain, edema, orthopnea, palpitations, paroxysmal nocturnal dyspnea or shortness of breath Dermatological: negative for rash Respiratory: negative for cough or wheezing Urologic: negative for hematuria Abdominal: negative for nausea, vomiting, diarrhea, bright red blood per rectum, melena, or hematemesis Neurologic: negative for visual changes, syncope, or dizziness All other systems reviewed and are otherwise negative except as noted above.    Blood pressure 134/90, pulse (!) 56, height 6' (1.829 m), weight 186 lb 3.2 oz (84.5 kg), SpO2 96 %.  General appearance: alert, cooperative and no distress Neck: no carotid bruit and no JVD Lungs: clear to auscultation bilaterally Heart: regular rate and rhythm Extremities: no edema Skin: Skin color, texture, turgor normal. No rashes or lesions Neurologic: Grossly normal  EKG NSR HR 53  ASSESSMENT AND PLAN:   Chest pain Negative nuclear stress May 2017 Coronary ca++ score 0- Oct 2017 Negative CPX Oct 2019  SOB (shortness of breath) Negative CPX Oct 2019 Normal echo May 2017 H/O negative sleep  study  Dyslipidemia LDL 150, HDL 40, trig 277- Dec 2019 Statin intolerance  Essential hypertension Normal Echo 04/13/18 Negative renal artery dopplers Goal B/P 120/80 or less  Family history of sudden cardiac death Brother died 52 (no autopsy). Pt's mother and father with a history of CAD  PVC's (premature ventricular contractions) PVCs and PACs on 48 hour Holter Oct 2019   PLAN I reviewed Sean Mckay labs with him in detail.  We went over his [employer] of cardiology guideline recommendations based on their risk factor assessment tool.  I strongly encouraged him to try and get his LDL closer to 100.  He has  been given a prescription for Zetia by his PCP but he hasn't filled it yet and because it is too expensive.  I asked him to consider a twice weekly statin therapy along with Zetia.   From a cardiac standpoint there is not much more to offer except our recommendations for blood pressure control and lipid therapy.  He continues to complain of dyspnea on exertion and I wonder if he has not had some occupational exposure to toxins that may be contributing.  I suggested he talk to his family doctor about PFTs and possibly a pulmonary evaluation.  Dr. Oval Linsey can see him back on an as-needed basis, he will call us if he feels like he needs to be seen.  [name] PA-C 06/28/2018 11:03 AM

## 2018-06-28 NOTE — Assessment & Plan Note (Signed)
Negative nuclear stress May 2017 Coronary ca++ score 0- Oct 2017 Negative CPX Oct 2019

## 2018-06-28 NOTE — Assessment & Plan Note (Signed)
Negative CPX Oct 2019 Normal echo May 2017 H/O negative sleep study

## 2018-06-29 ENCOUNTER — Telehealth: Payer: Self-pay | Admitting: Cardiovascular Disease

## 2018-06-29 NOTE — Telephone Encounter (Signed)
Spoke with Sean Mckay, ok per DPR Patient felt like Sean Mckay did not listen to his concerns and "dismissed" him. Patient didn't feel like cholesterol that bad. Sean Mckay was trying to get him on something to lower his cholesterol and patient can not tolerate cholesterol lowering medications. Plus Sean Mckay was recommending increasing activity and patient has extreme difficulty breathing with exertion. Sean Mckay suggested patients PCP refer to pulmonary for evaluation. I did apologize that the experience with Sean Mckay was not satisfying. I tried to explain goals of cardiology LDL's and being lower with risk factors. Also explained patients can be referred to cardiology first for shortness of breath to rule out before going to pulmonary. Did offer a follow up with Sean Mckay sooner to come in and discuss issues, they will call back if they decide to go this route. I explained Sean Mckay at hospital working this week but would forward her message.

## 2018-06-29 NOTE — Telephone Encounter (Signed)
  Patient is not pleased with his visit on 06/28/18 with [name] and would like Dr Oval Linsey to call him to discuss

## 2018-07-06 NOTE — Telephone Encounter (Signed)
I agree with Sean Mckay's thoughts.  His heart has been very stable on all his evaluations.  Seeing pulmonary isn't a bad idea for his persistent dyspnea.  Continuing to monitor diet and exercise as much as possible can help with lipids.

## 2018-07-12 NOTE — Telephone Encounter (Signed)
Detailed message left on the pt voicemail per DPR with Dr.Norlina's recommendation. Pt is to call back if any questions or concerns.

## 2018-07-26 ENCOUNTER — Telehealth: Payer: Self-pay | Admitting: Family Medicine

## 2018-07-26 NOTE — Telephone Encounter (Signed)
Patient calling to reschedule. Called office to see if someone was available to reschedule New Patient appointment with Dr Brigitte Pulse. No answer. Made OV appointment for 09/14/2018 at 10:00am with Dr Brigitte Pulse- Will need visit type changed to New Patient. Please advise.

## 2018-07-26 NOTE — Telephone Encounter (Signed)
LVM for pt to call the office and reschedule the appt with Dr. Brigitte Pulse. Dr. Brigitte Pulse is out of the office with an injury for the next 6 weeks. Best case scenario, she will be returning the first week of March. When pt calls back, please either reschedule Dr. Brigitte Pulse for the first week of March (or after) or with a different provider if the appt needs to happen sooner. Thank you!

## 2018-07-28 ENCOUNTER — Ambulatory Visit: Payer: Self-pay | Admitting: Family Medicine

## 2018-08-05 ENCOUNTER — Other Ambulatory Visit: Payer: Self-pay

## 2018-08-05 ENCOUNTER — Emergency Department (HOSPITAL_BASED_OUTPATIENT_CLINIC_OR_DEPARTMENT_OTHER)
Admission: EM | Admit: 2018-08-05 | Discharge: 2018-08-06 | Disposition: A | Discharge status: Home / Self Care | Payer: BC Managed Care – PPO | Attending: Emergency Medicine | Admitting: Emergency Medicine

## 2018-08-05 ENCOUNTER — Encounter (HOSPITAL_BASED_OUTPATIENT_CLINIC_OR_DEPARTMENT_OTHER): Payer: Self-pay | Admitting: *Deleted

## 2018-08-05 DIAGNOSIS — J111 Influenza due to unidentified influenza virus with other respiratory manifestations: Secondary | ICD-10-CM | POA: Diagnosis not present

## 2018-08-05 DIAGNOSIS — R05 Cough: Secondary | ICD-10-CM | POA: Insufficient documentation

## 2018-08-05 DIAGNOSIS — Z79899 Other long term (current) drug therapy: Secondary | ICD-10-CM | POA: Diagnosis not present

## 2018-08-05 DIAGNOSIS — I10 Essential (primary) hypertension: Secondary | ICD-10-CM | POA: Insufficient documentation

## 2018-08-05 DIAGNOSIS — R69 Illness, unspecified: Secondary | ICD-10-CM

## 2018-08-05 DIAGNOSIS — R509 Fever, unspecified: Secondary | ICD-10-CM | POA: Diagnosis present

## 2018-08-05 MED ORDER — OSELTAMIVIR PHOSPHATE 75 MG PO CAPS: 75.0000 mg | ORAL_CAPSULE | Freq: Two times a day (BID) | ORAL | 0 refills | Status: DC

## 2018-08-05 MED ORDER — ALBUTEROL SULFATE HFA 108 (90 BASE) MCG/ACT IN AERS: 2.0000 | INHALATION_SPRAY | RESPIRATORY_TRACT | 0 refills | Status: DC | PRN

## 2018-08-05 MED ORDER — BENZONATATE 100 MG PO CAPS: 100.0000 mg | ORAL_CAPSULE | Freq: Three times a day (TID) | ORAL | 0 refills | Status: DC | PRN

## 2018-08-05 MED ORDER — BENZONATATE 100 MG PO CAPS
100.0000 mg | ORAL_CAPSULE | Freq: Once | ORAL | Status: AC
  Administered 2018-08-06: 100 mg via ORAL
  Filled 2018-08-05: qty 1

## 2018-08-05 MED ORDER — OSELTAMIVIR PHOSPHATE 75 MG PO CAPS
75.0000 mg | ORAL_CAPSULE | Freq: Once | ORAL | Status: DC
  Filled 2018-08-05: qty 1

## 2018-08-05 NOTE — ED Triage Notes (Signed)
Fever, body aches, cough and headache since this afternoon. He took Motrin 600mg  an hour ago.

## 2018-08-05 NOTE — ED Provider Notes (Signed)
[place] DEPT [facility] Provider Note: Georgena Spurling, MD, FACEP  CSN: [id number] MRN: [id number] ARRIVAL: 08/05/18 at 2157 ROOM: [place]  08/05/18 11:44 PM Sean Mckay is a 53 y.o. male with flulike symptoms since earlier today.  Specifically he has had fever to 100.9, mild nasal congestion, body aches, chills, cough and shortness of breath.  He has taken ibuprofen and acetaminophen with partial relief of symptoms.  He denies sore throat, nausea, vomiting or diarrhea.    Past Medical History:  Diagnosis Date  . Adrenal adenoma 05/09/2016  . Essential hypertension 05/09/2016  . Hyperlipidemia   . Hypertriglyceridemia 05/09/2016    Past Surgical History:  Procedure Laterality Date  . HERNIA REPAIR  02/22/09   RIH    Family History  Problem Relation Age of Onset  . Cancer Father        leukemia  . Heart attack Father   . Heart attack Mother   . Cancer Paternal Grandmother        colon  . Sudden death Brother   . Heart attack Maternal Grandfather   . Stroke Maternal Grandmother   . Aneurysm Maternal Grandmother     Social History   Tobacco Use  . Smoking status: Never Smoker  . Smokeless tobacco: Never Used  Substance Use Topics  . Alcohol use: No  . Drug use: No    Prior to Admission medications   Medication Sig Start Date End Date Taking? Authorizing Provider  albuterol (PROVENTIL HFA;VENTOLIN HFA) 108 (90 Base) MCG/ACT inhaler Inhale 2 puffs into the lungs every 4 (four) hours as needed for wheezing or shortness of breath. 08/05/18   Jenean Escandon, Jenny Reichmann, MD  benzonatate (TESSALON) 100 MG capsule Take 1 capsule (100 mg total) by mouth every 8 (eight) hours as needed for cough (swallow whole). 08/05/18   Alane Hanssen, MD  cloNIDine (CATAPRES) 0.1 MG tablet Take 1 tablet by mouth daily as needed.  09/16/17   [provider]  ezetimibe (ZETIA) 10 MG tablet Take 1 tablet (10 mg total) by mouth daily.  04/26/18 07/25/18  Skeet Latch, MD  levothyroxine (SYNTHROID, LEVOTHROID) 112 MCG tablet Take 1 tablet by mouth daily. 12/11/15   [provider]  loperamide (IMODIUM A-D) 2 MG tablet Take 2 mg by mouth 2 (two) times daily.    [provider]  Nebivolol HCl (BYSTOLIC PO) Take 10 mg by mouth daily.     [provider]  omega-3 acid ethyl esters (LOVAZA) 1 g capsule Take by mouth daily.    [provider]  oseltamivir (TAMIFLU) 75 MG capsule Take 1 capsule (75 mg total) by mouth every 12 (twelve) hours. 08/05/18   Idamay Hosein, MD  valsartan (DIOVAN) 80 MG tablet Take 1 tablet (80 mg total) by mouth daily. 01/27/17   Skeet Latch, MD  vitamin E 200 UNIT capsule Take 200 Units by mouth daily.    [provider]    Allergies Hydralazine; Lisinopril; Norvasc [amlodipine besylate]; Prednisone; Promethazine hcl; Statins; Ezetimibe-simvastatin; and Promethazine   REVIEW OF SYSTEMS  Negative except as noted here or in the History of Present Illness.   PHYSICAL EXAMINATION  Initial Vital Signs Blood pressure 115/83, pulse 80, temperature 99.3 F (37.4 C), temperature source Oral, resp. rate 20, height 6' (1.829 m), weight 83.5 kg, SpO2 95 %.  Examination General: Well-developed, well-nourished male in no acute distress; appearance consistent with age of record HENT: normocephalic; atraumatic; pharynx  normal Eyes: pupils equal, round and reactive to light; extraocular muscles intact Neck: supple Heart: regular rate and rhythm Lungs: clear to auscultation bilaterally Abdomen: soft; nondistended; nontender; bowel sounds present Extremities: No deformity; full range of motion Neurologic: Awake, alert and oriented; motor function intact in all extremities and symmetric; no facial droop Skin: Warm and dry Psychiatric: Normal mood and affect   RESULTS  Summary of this visit's results, reviewed by myself:   EKG Interpretation  Date/Time:      Ventricular Rate:    PR Interval:    QRS Duration:   QT Interval:    QTC Calculation:   R Axis:     Text Interpretation:        Laboratory Studies: No results found for this or any previous visit (from the past 24 hour(s)). Imaging Studies: No results found.  ED COURSE and MDM  Nursing notes and initial vitals signs, including pulse oximetry, reviewed.  Vitals:   08/05/18 2209  BP: 115/83  Pulse: 80  Resp: 20  Temp: 99.3 F (37.4 C)  TempSrc: Oral  SpO2: 95%  Weight: 83.5 kg  Height: 6' (1.829 m)   Symptoms are consistent with influenza or influenza-like illness.  Patient is requesting Tamiflu.  PROCEDURES    ED DIAGNOSES     ICD-10-CM   1. Influenza-like illness R69        Shanon Rosser, MD 08/05/18 2358

## 2018-08-13 ENCOUNTER — Encounter: Payer: Self-pay | Admitting: Family Medicine

## 2018-08-14 NOTE — Telephone Encounter (Signed)
Called pt and gave him the [facility] billing telephone number.

## 2018-09-14 ENCOUNTER — Ambulatory Visit: Payer: Self-pay | Admitting: Family Medicine

## 2019-02-22 ENCOUNTER — Other Ambulatory Visit: Payer: Self-pay | Admitting: Family Medicine

## 2019-02-22 DIAGNOSIS — M1990 Unspecified osteoarthritis, unspecified site: Secondary | ICD-10-CM

## 2019-02-23 ENCOUNTER — Other Ambulatory Visit: Payer: Self-pay

## 2019-02-23 ENCOUNTER — Ambulatory Visit
Admission: RE | Admit: 2019-02-23 | Discharge: 2019-02-23 | Disposition: A | Discharge status: Home / Self Care | Payer: BC Managed Care – PPO | Source: Ambulatory Visit | Attending: Family Medicine | Admitting: Family Medicine

## 2019-02-23 DIAGNOSIS — M1990 Unspecified osteoarthritis, unspecified site: Secondary | ICD-10-CM

## 2019-08-26 ENCOUNTER — Ambulatory Visit: Payer: BC Managed Care – PPO

## 2020-02-20 ENCOUNTER — Encounter: Payer: Self-pay | Admitting: Gastroenterology

## 2020-04-13 ENCOUNTER — Other Ambulatory Visit: Payer: Self-pay | Admitting: Gastroenterology

## 2020-04-13 DIAGNOSIS — R109 Unspecified abdominal pain: Secondary | ICD-10-CM

## 2020-04-13 DIAGNOSIS — R634 Abnormal weight loss: Secondary | ICD-10-CM

## 2020-04-24 ENCOUNTER — Ambulatory Visit: Payer: BC Managed Care – PPO | Admitting: Gastroenterology

## 2020-04-27 ENCOUNTER — Other Ambulatory Visit: Payer: Self-pay

## 2020-04-27 ENCOUNTER — Ambulatory Visit
Admission: RE | Admit: 2020-04-27 | Discharge: 2020-04-27 | Disposition: A | Discharge status: Home / Self Care | Payer: BC Managed Care – PPO | Source: Ambulatory Visit | Attending: Gastroenterology | Admitting: Gastroenterology

## 2020-04-27 DIAGNOSIS — R109 Unspecified abdominal pain: Secondary | ICD-10-CM

## 2020-04-27 DIAGNOSIS — R634 Abnormal weight loss: Secondary | ICD-10-CM

## 2020-04-27 MED ORDER — IOPAMIDOL (ISOVUE-300) INJECTION 61%
100.0000 mL | Freq: Once | INTRAVENOUS | Status: AC | PRN
  Administered 2020-04-27: 100 mL via INTRAVENOUS

## 2020-04-30 ENCOUNTER — Other Ambulatory Visit: Payer: Self-pay | Admitting: Family Medicine

## 2020-04-30 DIAGNOSIS — Z09 Encounter for follow-up examination after completed treatment for conditions other than malignant neoplasm: Secondary | ICD-10-CM

## 2020-05-09 ENCOUNTER — Ambulatory Visit
Admission: RE | Admit: 2020-05-09 | Discharge: 2020-05-09 | Disposition: A | Discharge status: Home / Self Care | Payer: BC Managed Care – PPO | Source: Ambulatory Visit | Attending: Family Medicine | Admitting: Family Medicine

## 2020-05-09 DIAGNOSIS — Z09 Encounter for follow-up examination after completed treatment for conditions other than malignant neoplasm: Secondary | ICD-10-CM

## 2020-12-03 NOTE — Progress Notes (Signed)
Cardiology Office Note   Date:  12/06/2020   ID:  Sean Mckay, DOB 1966/04/16, MRN [id number]  PCP:  Dyann Ruddle, MD  Cardiologist:   Skeet Latch, MD   Chief Complaint  Patient presents with  . Follow-up  . Headache  . Shortness of Breath  . Chest Pain    History of Present Illness: Sean Mckay is a 55 y.o. male with hypertension and hyperlipidemia who presents for follow up.  Sean Mckay was first seen 12/2015 due to poorly-controlled hypertension.  He has struggled with poorly-controlled hypertension and intolerance to many medications.  He had renal artery Dopplers that were negative for renal artery stenosis.  He also had carotid Dopplers due to an episode of syncope that were negative for obstruction.  He also had a nuclear stress test that was negative for ischemia. He reports wearing a 48-hour Holter years ago that showed PACs and PVCs.  Of note, Sean Mckay has a brother who died of sudden cardiac death at age 26. No autopsy was performed.  Both his mother and father have had heart attacks and bypass surgery. Maternal grandfather had a heart attack at age 69.    Sean Mckay was started on HCTZ  12.5 mg daily.  However he did not tolerate this and was started on valsartan, which was subsequently increased to 80 mg due to poor BP control.  He developed fatigue and shortness of breath on nebivolol 20mg  daily, so this was reduced.  On 03/2016 he reported episodes of sharp, atypical chest pain.  Given this as well as his family history of CAD and borderline lipids, he was referred for cardiac CT-A.   He was found to have a coronary calcium score of 0.  His pulmonary artery was noted to be mildly dilated to 3.1 x 2.7 cm.   On 04/2018 Sean Mckay reported intermittent episodes of chest pain.   Sean Mckay followed up with his PCP regarding the symptoms.  She checked a chest x-ray that showed no acute findings but did note some old scarring in his left lung.  He  thinks this is due to a prior pneumonia.  She also checked a d-dimer that was normal.  Of note, he had a sleep study in 2017 that was negative for sleep apnea.  Given that there wa no CAD noted on coronary CT-A and he had a calcium score of 0 in 04/2016 it was felt that his symptoms were not related to ischemia.  He also reported palpitations so he was referred for 48-hour Holter monitor that showed PACs and PVCs but no significant arrhythmias.  He was referred for an echo 04/13/18 that showed normal systolic function and trivial TR but was otherwise unremarkable. He saw Sean Mckay 06/2018 and continued to have shortness of breath, so he was referred to pulmonary.   12/06/2020 Today, he has a few concerns. For about a month he is having episodes of a weird sensation in his chest, like a "flipping" or "sinking" feeling. This causes him to be lightheaded and pre-syncope (which he thinks might be the "sinking"). He also describes the "flipping" feeling as possibly being heart "flutters"/palpitations. These episodes are typically short, and occur at rest or exertion (mowing the lawn). He has also experienced shortness of breath at rest or exertion. Lately, his narrow pulse pressure has been low. His PCP found his pulse to be 51. That evening his pulse dropped to 39, and he discontinued the beta blocker. After a  week, he started a half dose of the beta blocker and nothing has changed. However, he does seem to be at least tolerating the lower dose. His oxygen is also low, he has seen as low as 88% saturation. He has not seen a pulmonary specialist. He denies any acid reflux issues. At home his blood pressure has been around 124/92 or 114/84. His heart rate has been 72 and 74 when checking his blood pressure. His formal exercise is limited by lower back issues. However, he is still active. 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 Typically he drinks 4 cans of cola a day. He is mostly  vegetarian and only eats seafood meat, as he has developed an allergy to beef, pork, and chicken. He has been tested for Alpha-GAL and believes he does not have this defect. He denies any chest pain, headaches, lower extremity edema, orthopnea or PND. Previously he has tried Zetia and does not tolerate it.  Adverse effects:  Amlodipine: syncope Lisinopril: dizziness Hydralazine: Muscle aches, fatigue Nifedipine: fatigue, shortness of breath HCTZ: thirst, cough, tremor Nebivolol 20 mg: fatigue, shortness of breath.  Bradycardia with 10mg    Past Medical History:  Diagnosis Date  . Adrenal adenoma 05/09/2016  . Essential hypertension 05/09/2016  . Hyperlipidemia   . Hypertriglyceridemia 05/09/2016  . Palpitations 12/06/2020  . Pure hypercholesterolemia 12/06/2020    Past Surgical History:  Procedure Laterality Date  . HERNIA REPAIR  02/22/09   RIH     Current Outpatient Medications  Medication Sig Dispense Refill  . cloNIDine (CATAPRES) 0.1 MG tablet Take 1 tablet by mouth daily as needed.     Marland Kitchen levothyroxine (SYNTHROID, LEVOTHROID) 112 MCG tablet Take 1 tablet by mouth daily.  5  . loperamide (IMODIUM A-D) 2 MG tablet Take 2 mg by mouth 2 (two) times daily.    . Nebivolol HCl (BYSTOLIC PO) Take 5 mg by mouth daily.    . rosuvastatin (CRESTOR) 5 MG tablet TAKE 1 TABLET BY MOUTH Monday, Wednesday, AND Friday ONLY 50 tablet 3  . valsartan (DIOVAN) 80 MG tablet Take 1 tablet (80 mg total) by mouth daily. 90 tablet 1   No current facility-administered medications for this visit.    Allergies:   Hydralazine, Lisinopril, Norvasc [amlodipine besylate], Prednisone, Promethazine hcl, Statins, Ezetimibe-simvastatin, and Promethazine    Social History:  The patient  reports that he has never smoked. He has never used smokeless tobacco. He reports that he does not drink alcohol and does not use drugs.   Family History:  The patient's family history includes Aneurysm in his maternal  grandmother; Cancer in his father and paternal grandmother; Heart attack in his father, maternal grandfather, and mother; Stroke in his maternal grandmother; Sudden death in his brother.    ROS:   Please see the history of present illness. (+) Palpitations (+) Chest pressure (+) Lightheadedness (+) Pre-syncope (+) Shortness of breath All other systems are reviewed and negative.    PHYSICAL EXAM: VS:  BP 118/84 (BP Location: Left Arm, Patient Position: Sitting, Cuff Size: Normal)   Pulse (!) 59   Ht 6' (1.829 m)   Wt 181 lb (82.1 kg)   BMI 24.55 kg/m  , BMI Body mass index is 24.55 kg/m. GENERAL:  Well appearing HEENT: Pupils equal round and reactive, fundi not visualized, oral mucosa unremarkable NECK:  No jugular venous distention, waveform within normal limits, carotid upstroke brisk and symmetric, no bruits, no thyromegaly LYMPHATICS:  No cervical adenopathy LUNGS:  Clear to auscultation bilaterally HEART:  RRR.  PMI not displaced or sustained,S1 and S2 within normal limits, no S3, no S4, no clicks, no rubs, no murmurs ABD:  Flat, positive bowel sounds normal in frequency in pitch, no bruits, no rebound, no guarding, no midline pulsatile mass, no hepatomegaly, no splenomegaly EXT:  2 plus pulses throughout, no edema, no cyanosis no clubbing SKIN:  No rashes no nodules NEURO:  Cranial nerves II through XII grossly intact, motor grossly intact throughout PSYCH:  Cognitively intact, oriented to person place and time   EKG:   12/06/2020: Sinus bradycardia. Rate 59 bpm. 04/01/2018: Sinus bradycardia.  Rate 53 bpm.  Less than 1 mm ST elevation inferiorly, anteriorly, and laterally. 12/24/15: Sinus bradycardia rate 54 bpm.  R axis deviation.   Lexiscan Myoview 12/03/15:  LVEF 61%.  No ischemia.  Carotid artery Doppler 12/05/15: 1-39% ICA stenosis bilaterally  Echo 12/03/15:  Normal LV size and function.  Mild MR, trace TR.   Echo 04/13/18: LVEF 55-60%.  Trivial TR.   48 Hour  Event Monitor 04/13/18:  Quality: Fair.  Baseline artifact. Predominant rhythm: sinus rhythm Average heart rate: 67 bpm Max heart rate: 120 bpm Min heart rate: 47 bpm  Rare PVCs and occasional PACs 3 beats of NSVT  Patient reported episodes of chest pain and jaw pain, at which time sinus rhythm was noted.   Recent Labs: No results found for requested labs within last 8760 hours.    Lipid Panel No results found for: CHOL, TRIG, HDL, CHOLHDL, VLDL, LDLCALC, LDLDIRECT 12/02/15: Total cholesterol 199, triglycerides 180 HDL 36, LDL 126  12/18:  TC 246 197 LDL, HDL 36, tri 194    Wt Readings from Last 3 Encounters:  12/06/20 181 lb (82.1 kg)  08/05/18 184 lb (83.5 kg)  06/28/18 186 lb 3.2 oz (84.5 kg)      ASSESSMENT AND PLAN: Palpitations Sean Mckay bradycardia.  This seems to have improved since reducing his Bystolic.  He is also been having some palpitations with some PVCs.  He does have a documented history of PVCs.  We will check a CMP, TSH, CBC, and magnesium.  It does seem as though it has improved since he reduced his Bystolic.  I wonder if he was having ventricular escape in the setting of bradycardia.  He does not snore so we will not get a sleep study.  He has documented some hypoxia.  Therefore we will refer him to pulmonary for further review.  Chest pain Sean Mckay is having several exertional chest discomfort.  He has previously had negative work-ups for CAD including calcium score of 0.  However this was several years ago.  We will get a Lexiscan Myoview to assess for ischemia.  He is unable to walk on a treadmill due to back issues.  Essential hypertension Blood pressure has been well-controlled.  Continue Bystolic, clonidine, and valsartan.  Pure hypercholesterolemia ASCVD 10-year risk is 8.4%.  He has not tolerated several statins in the past.  We will try rosuvastatin 5 mg q. Monday Wednesday Friday and repeat lipids and a CMP in 2 to 3 months.  He will come  back tomorrow for fasting lipids and CMP.  He does have aortic atherosclerosis on a CT last year.  He still does not tolerate statins.  Consider lipid medications.     Current medicines are reviewed at length with the patient today.  The patient does not have concerns regarding medicines.  The following  changes have been made:  None  Labs/ tests ordered today include:    Orders Placed This Encounter  Procedures  . CBC with Differential/Platelet  . T4, free  . TSH  . Lipid panel  . Comprehensive metabolic panel  . Magnesium  . Ambulatory referral to Pulmonology  . MYOCARDIAL PERFUSION IMAGING  . EKG 12-Lead    Disposition:   FU with Sean Bartosiewicz C. Oval Linsey, MD, [facility] in 2-3 months.   I,Sean Mckay,acting as a Education administrator for Skeet Latch, MD.,have documented all relevant documentation on the behalf of Skeet Latch, MD,as directed by  Skeet Latch, MD while in the presence of Skeet Latch, MD.  I, [place] Oval Linsey, MD have reviewed all documentation for this visit.  The documentation of the exam, diagnosis, procedures, and orders on 12/06/2020 are all accurate and complete.   Signed, Tashi Band C. Oval Linsey, MD, [facility]  12/06/2020 11:48 AM    [place]

## 2020-12-06 ENCOUNTER — Other Ambulatory Visit: Payer: Self-pay

## 2020-12-06 ENCOUNTER — Encounter: Payer: Self-pay | Admitting: Cardiovascular Disease

## 2020-12-06 ENCOUNTER — Ambulatory Visit: Payer: BC Managed Care – PPO | Admitting: Cardiovascular Disease

## 2020-12-06 VITALS — BP 118/84 | HR 59 | Ht 72.0 in | Wt 181.0 lb

## 2020-12-06 DIAGNOSIS — I10 Essential (primary) hypertension: Secondary | ICD-10-CM

## 2020-12-06 DIAGNOSIS — E7800 Pure hypercholesterolemia, unspecified: Secondary | ICD-10-CM | POA: Diagnosis not present

## 2020-12-06 DIAGNOSIS — Z5181 Encounter for therapeutic drug level monitoring: Secondary | ICD-10-CM | POA: Diagnosis not present

## 2020-12-06 DIAGNOSIS — R079 Chest pain, unspecified: Secondary | ICD-10-CM | POA: Diagnosis not present

## 2020-12-06 DIAGNOSIS — R002 Palpitations: Secondary | ICD-10-CM

## 2020-12-06 DIAGNOSIS — R0602 Shortness of breath: Secondary | ICD-10-CM

## 2020-12-06 HISTORY — DX: Palpitations: R00.2

## 2020-12-06 HISTORY — DX: Pure hypercholesterolemia, unspecified: E78.00

## 2020-12-06 MED ORDER — ROSUVASTATIN CALCIUM 5 MG PO TABS: ORAL_TABLET | ORAL | 3 refills | Status: DC

## 2020-12-06 NOTE — Assessment & Plan Note (Signed)
Blood pressure has been well-controlled.  Continue Bystolic, clonidine, and valsartan.

## 2020-12-06 NOTE — Assessment & Plan Note (Signed)
Shidler is having several exertional chest discomfort.  He has previously had negative work-ups for CAD including calcium score of 0.  However this was several years ago.  We will get a Lexiscan Myoview to assess for ischemia.  He is unable to walk on a treadmill due to back issues.

## 2020-12-06 NOTE — Assessment & Plan Note (Signed)
ASCVD 10-year risk is 8.4%.  He has not tolerated several statins in the past.  We will try rosuvastatin 5 mg q. Monday Wednesday Friday and repeat lipids and a CMP in 2 to 3 months.  He will come back tomorrow for fasting lipids and CMP.  He does have aortic atherosclerosis on a CT last year.  He still does not tolerate statins.  Consider lipid medications.

## 2020-12-06 NOTE — Patient Instructions (Addendum)
Medication Instructions:  START ROSUVASTATIN 5 MG Monday, Wednesday, AND Friday ONLY   *If you need a refill on your cardiac medications before your next appointment, please call your pharmacy*  Lab Work: FASTING LP/CMET/TSH/FT4/MAGNESIUM/CBC SOON   If you have labs (blood work) drawn today and your tests are completely normal, you will receive your results only by: Marland Kitchen MyChart Message (if you have MyChart) OR . A paper copy in the mail If you have any lab test that is abnormal or we need to change your treatment, we will call you to review the results.  Testing/Procedures: Your physician has requested that you have a lexiscan myoview. For further information please visit [email or web address]. Please follow instruction sheet, as given.  Follow-Up: At [facility], you and your health needs are our priority.  As part of our continuing mission to provide you with exceptional heart care, we have created designated Provider Care Teams.  These Care Teams include your primary Cardiologist (physician) and Advanced Practice Providers (APPs -  Physician Assistants and Nurse Practitioners) who all work together to provide you with the care you need, when you need it.  We recommend signing up for the patient portal called "MyChart".  Sign up information is provided on this After Visit Summary.  MyChart is used to connect with patients for Virtual Visits (Telemedicine).  Patients are able to view lab/test results, encounter notes, upcoming appointments, etc.  Non-urgent messages can be sent to your provider as well.   To learn more about what you can do with MyChart, go to [email or web address].    Your next appointment:   2 month(s)  The format for your next appointment:   In Person  Provider:   AT Man W NP   You have been referred to    Where: [facility] Address: [address] [place] [address] Phone:  [phone]  IF YOU DO NOT HEAR FROM PULMONARY IN 2 WEEKS CALL THEM DIRECTLY AT [place]

## 2020-12-06 NOTE — Assessment & Plan Note (Signed)
Mr. Demelo bradycardia.  This seems to have improved since reducing his Bystolic.  He is also been having some palpitations with some PVCs.  He does have a documented history of PVCs.  We will check a CMP, TSH, CBC, and magnesium.  It does seem as though it has improved since he reduced his Bystolic.  I wonder if he was having ventricular escape in the setting of bradycardia.  He does not snore so we will not get a sleep study.  He has documented some hypoxia.  Therefore we will refer him to pulmonary for further review.

## 2020-12-07 ENCOUNTER — Telehealth: Payer: Self-pay | Admitting: *Deleted

## 2020-12-07 LAB — COMPREHENSIVE METABOLIC PANEL
ALT: 15 IU/L (ref 0–44)
AST: 14 IU/L (ref 0–40)
Albumin/Globulin Ratio: 1.6 (ref 1.2–2.2)
Albumin: 4.2 g/dL (ref 3.8–4.9)
Alkaline Phosphatase: 42 IU/L — ABNORMAL LOW (ref 44–121)
BUN/Creatinine Ratio: 12 (ref 9–20)
BUN: 11 mg/dL (ref 6–24)
Bilirubin Total: 0.4 mg/dL (ref 0.0–1.2)
CO2: 23 mmol/L (ref 20–29)
Calcium: 9.4 mg/dL (ref 8.7–10.2)
Chloride: 102 mmol/L (ref 96–106)
Creatinine, Ser: 0.94 mg/dL (ref 0.76–1.27)
Globulin, Total: 2.7 g/dL (ref 1.5–4.5)
Glucose: 111 mg/dL — ABNORMAL HIGH (ref 65–99)
Potassium: 4.4 mmol/L (ref 3.5–5.2)
Sodium: 140 mmol/L (ref 134–144)
Total Protein: 6.9 g/dL (ref 6.0–8.5)
eGFR: 96 mL/min/{1.73_m2} (ref >59)

## 2020-12-07 LAB — CBC WITH DIFFERENTIAL/PLATELET
Basophils Absolute: 0.1 10*3/uL (ref 0.0–0.2)
Basos: 2 %
EOS (ABSOLUTE): 0.3 10*3/uL (ref 0.0–0.4)
Eos: 4 %
Hematocrit: 44.8 % (ref 37.5–51.0)
Hemoglobin: 15 g/dL (ref 13.0–17.7)
Immature Grans (Abs): 0 10*3/uL (ref 0.0–0.1)
Immature Granulocytes: 0 %
Lymphocytes Absolute: 2.4 10*3/uL (ref 0.7–3.1)
Lymphs: 35 %
MCH: 28.4 pg (ref 26.6–33.0)
MCHC: 33.5 g/dL (ref 31.5–35.7)
MCV: 85 fL (ref 79–97)
Monocytes Absolute: 0.5 10*3/uL (ref 0.1–0.9)
Monocytes: 7 %
Neutrophils Absolute: 3.4 10*3/uL (ref 1.4–7.0)
Neutrophils: 52 %
Platelets: 331 10*3/uL (ref 150–450)
RBC: 5.28 x10E6/uL (ref 4.14–5.80)
RDW: 13.3 % (ref 11.6–15.4)
WBC: 6.8 10*3/uL (ref 3.4–10.8)

## 2020-12-07 LAB — LIPID PANEL
Chol/HDL Ratio: 5.9 ratio — ABNORMAL HIGH (ref 0.0–5.0)
Cholesterol, Total: 252 mg/dL — ABNORMAL HIGH (ref 100–199)
HDL: 43 mg/dL (ref >39)
LDL Chol Calc (NIH): 174 mg/dL — ABNORMAL HIGH (ref 0–99)
Triglycerides: 189 mg/dL — ABNORMAL HIGH (ref 0–149)
VLDL Cholesterol Cal: 35 mg/dL (ref 5–40)

## 2020-12-07 LAB — T4, FREE: Free T4: 1.22 ng/dL (ref 0.82–1.77)

## 2020-12-07 LAB — MAGNESIUM: Magnesium: 2 mg/dL (ref 1.6–2.3)

## 2020-12-07 LAB — TSH: TSH: 2.82 u[IU]/mL (ref 0.450–4.500)

## 2020-12-07 NOTE — Telephone Encounter (Signed)
A message was left,re: his referral to Dr.Byrum's office on June 16 th. Appointment mailed.

## 2020-12-11 ENCOUNTER — Telehealth (HOSPITAL_COMMUNITY): Payer: Self-pay | Admitting: *Deleted

## 2020-12-11 NOTE — Telephone Encounter (Signed)
Close encounter 

## 2020-12-12 ENCOUNTER — Encounter: Payer: Self-pay | Admitting: *Deleted

## 2020-12-12 ENCOUNTER — Other Ambulatory Visit: Payer: Self-pay | Admitting: *Deleted

## 2020-12-12 ENCOUNTER — Ambulatory Visit (HOSPITAL_COMMUNITY)
Admission: RE | Admit: 2020-12-12 | Discharge: 2020-12-12 | Disposition: A | Discharge status: Home / Self Care | Payer: BC Managed Care – PPO | Source: Ambulatory Visit | Attending: Cardiovascular Disease | Admitting: Cardiovascular Disease

## 2020-12-12 ENCOUNTER — Other Ambulatory Visit: Payer: Self-pay

## 2020-12-12 ENCOUNTER — Ambulatory Visit (HOSPITAL_BASED_OUTPATIENT_CLINIC_OR_DEPARTMENT_OTHER)
Admission: RE | Admit: 2020-12-12 | Discharge: 2020-12-12 | Disposition: A | Discharge status: Home / Self Care | Payer: BC Managed Care – PPO | Source: Ambulatory Visit | Attending: Cardiovascular Disease | Admitting: Cardiovascular Disease

## 2020-12-12 DIAGNOSIS — R0602 Shortness of breath: Secondary | ICD-10-CM

## 2020-12-12 DIAGNOSIS — I10 Essential (primary) hypertension: Secondary | ICD-10-CM

## 2020-12-12 DIAGNOSIS — R079 Chest pain, unspecified: Secondary | ICD-10-CM

## 2020-12-12 DIAGNOSIS — M79602 Pain in left arm: Secondary | ICD-10-CM

## 2020-12-12 DIAGNOSIS — E7800 Pure hypercholesterolemia, unspecified: Secondary | ICD-10-CM

## 2020-12-12 DIAGNOSIS — R002 Palpitations: Secondary | ICD-10-CM

## 2020-12-12 DIAGNOSIS — Z5181 Encounter for therapeutic drug level monitoring: Secondary | ICD-10-CM

## 2020-12-12 LAB — MYOCARDIAL PERFUSION IMAGING
LV dias vol: 111 mL (ref 62–150)
LV sys vol: 54 mL
Peak HR: 85 {beats}/min
Rest HR: 48 {beats}/min
SDS: 4
SRS: 2
SSS: 6
TID: 1.08

## 2020-12-12 MED ORDER — TECHNETIUM TC 99M TETROFOSMIN IV KIT
29.1000 | PACK | Freq: Once | INTRAVENOUS | Status: AC | PRN
  Administered 2020-12-12: 29.1 via INTRAVENOUS
  Filled 2020-12-12: qty 30

## 2020-12-12 MED ORDER — TECHNETIUM TC 99M TETROFOSMIN IV KIT
9.9000 | PACK | Freq: Once | INTRAVENOUS | Status: AC | PRN
  Administered 2020-12-12: 9.9 via INTRAVENOUS
  Filled 2020-12-12: qty 10

## 2020-12-12 MED ORDER — REGADENOSON 0.4 MG/5ML IV SOLN
0.4000 mg | Freq: Once | INTRAVENOUS | Status: AC
  Administered 2020-12-12: 0.4 mg via INTRAVENOUS

## 2020-12-12 NOTE — Progress Notes (Signed)
This encounter was created in error - please disregard.

## 2020-12-12 NOTE — Addendum Note (Signed)
Addended by: Alvina Filbert B on: 12/12/2020 09:27 AM   Modules accepted: Orders

## 2020-12-13 ENCOUNTER — Ambulatory Visit (HOSPITAL_COMMUNITY)
Admission: RE | Admit: 2020-12-13 | Discharge: 2020-12-13 | Disposition: A | Discharge status: Home / Self Care | Payer: BC Managed Care – PPO | Source: Ambulatory Visit | Attending: Cardiology | Admitting: Cardiology

## 2020-12-13 ENCOUNTER — Encounter: Payer: Self-pay | Admitting: *Deleted

## 2020-12-13 ENCOUNTER — Other Ambulatory Visit: Payer: Self-pay | Admitting: Cardiovascular Disease

## 2020-12-13 DIAGNOSIS — M79602 Pain in left arm: Secondary | ICD-10-CM

## 2020-12-14 NOTE — Telephone Encounter (Signed)
I told the patient that is was related to the regadenason that likely caused some spasm of the blood vessels to his hand. His ultraound showed nothing concerning. He should follow with Dr. Oval Linsey. Maybe he just has Raynaud's.   [place] T. Audie Box, MD, [place]  [address] [place], [place] [address] [phone]  2:44 PM

## 2020-12-26 ENCOUNTER — Telehealth: Payer: Self-pay | Admitting: *Deleted

## 2020-12-26 DIAGNOSIS — R0602 Shortness of breath: Secondary | ICD-10-CM

## 2020-12-26 DIAGNOSIS — E7800 Pure hypercholesterolemia, unspecified: Secondary | ICD-10-CM

## 2020-12-26 DIAGNOSIS — I10 Essential (primary) hypertension: Secondary | ICD-10-CM

## 2020-12-26 DIAGNOSIS — R002 Palpitations: Secondary | ICD-10-CM

## 2020-12-26 DIAGNOSIS — R079 Chest pain, unspecified: Secondary | ICD-10-CM

## 2020-12-26 NOTE — Telephone Encounter (Signed)
Patient returning call.

## 2020-12-26 NOTE — Telephone Encounter (Signed)
Advised patient, order placed, message to scheduling  Patient stated he continues to have intermittent tingling and discoloration in hand Primarily int 2, 3, and 4th digits mild in 1 & 5 4th digit get discolored Has not associated this happing with activity, temperature or anything else Happening every other day, sometimes daily This is not consistent either  Advised will forward to Dr Oval Linsey for review

## 2020-12-26 NOTE — Telephone Encounter (Signed)
-----   Message from Skeet Latch, MD sent at 12/24/2020  3:24 PM EDT ----- Stress test was not very accurate.  Recommend getting a coronary CT-A to better assess.

## 2020-12-27 ENCOUNTER — Ambulatory Visit: Payer: BC Managed Care – PPO | Admitting: Emergency Medicine

## 2020-12-27 ENCOUNTER — Other Ambulatory Visit: Payer: Self-pay

## 2020-12-27 ENCOUNTER — Encounter: Payer: Self-pay | Admitting: Emergency Medicine

## 2020-12-27 DIAGNOSIS — R0602 Shortness of breath: Secondary | ICD-10-CM

## 2020-12-27 NOTE — Progress Notes (Signed)
Subjective:    Patient ID: Sean Mckay, male    DOB: 1966-07-07, 55 y.o.   MRN: [id number]  HPI  55 year old never smoker with a history of hypertension, hyperlipidemia, palpitations, adrenal adenoma.  His blood pressures been difficult to manage due to multiple medication intolerances.  No evidence of renal artery stenosis.  He is here for dyspnea w exertion, happens when he does yard work. Began to be problematic 2017. Sometimes has dyspnea when he walks on flat ground, always on an incline, always w stairs. Gets a scratchy throat w dusts, etc. He believes that he has seen some intermittent bradycardia to 30's, sometimes associated w desats. No cough, does have some mid chest tightness. Can feel a flutter in the chest, can last a minute, associated with a "sinking feeling".   ? Raynaud's in fingers L hand. Happened 2 weeks ago. Duplex was OK on 12/12/20   Lexiscan Myoview 12/03/2015 was a reassuring study, no ischemia Echocardiogram 04/13/2018 showed normal LV function, normal RV size and function Cardiopulmonary exercise test 05/12/2018 reviewed showed normal airflows prior to exercise with an elevated FEV1 and FVC ratio 92%, normal functional capacity, no indication of cardiopulmonary limitation, consistent with deconditioning   Review of Systems As per HPI  Past Medical History:  Diagnosis Date   Adrenal adenoma 05/09/2016   Essential hypertension 05/09/2016   Hyperlipidemia    Hypertriglyceridemia 05/09/2016   Palpitations 12/06/2020   Pure hypercholesterolemia 12/06/2020     Family History  Problem Relation Age of Onset   Cancer Father        leukemia   Heart attack Father    Heart attack Mother    Cancer Paternal Grandmother        colon   Sudden death Brother    Heart attack Maternal Grandfather    Stroke Maternal Grandmother    Aneurysm Maternal Grandmother      Social History   Socioeconomic History   Marital status: Married    Spouse name: Not on file    Number of children: Not on file   Years of education: Not on file   Highest education level: Not on file  Occupational History   Occupation: Architect: Scientist, water quality  Tobacco Use   Smoking status: Never   Smokeless tobacco: Never  Substance and Sexual Activity   Alcohol use: No   Drug use: No   Sexual activity: Not on file  Other Topics Concern   Not on file  Social History Narrative   Not on file   Social Determinants of Health   Financial Resource Strain: Not on file  Food Insecurity: Not on file  Transportation Needs: Not on file  Physical Activity: Not on file  Stress: Not on file  Social Connections: Not on file  Intimate Partner Violence: Not on file     native No [employer] security systems.  No exposures No mold Asbestos exposure in the past.    Allergies  Allergen Reactions   Hydralazine Other (See Comments)    Fatigue and muscle/joint pain Other reaction(s): Arthralgias (intolerance) Fatigue, myalgias   Lisinopril Other (See Comments)    Dizziness    Norvasc [Amlodipine Besylate]     dizziness   Prednisone Other (See Comments)    "vision problems" Other reaction(s): Other (See Comments), Other (See Comments) Blurred vision Visual problem when taken orally    Promethazine Hcl    Statins Other (See Comments)    aches Other  reaction(s): Myalgias (intolerance)   Ezetimibe-Simvastatin Rash   Promethazine Nausea Only    Other reaction(s): Other (See Comments) Unknown reaction      Outpatient Medications Prior to Visit  Medication Sig Dispense Refill   cloNIDine (CATAPRES) 0.1 MG tablet Take 1 tablet by mouth daily as needed.      levothyroxine (SYNTHROID, LEVOTHROID) 112 MCG tablet Take 1 tablet by mouth daily.  5   loperamide (IMODIUM A-D) 2 MG tablet Take 2 mg by mouth 2 (two) times daily.     Nebivolol HCl (BYSTOLIC PO) Take 5 mg by mouth daily.     sitaGLIPtin (JANUVIA) 100 MG tablet       valsartan (DIOVAN) 80 MG tablet Take 1 tablet (80 mg total) by mouth daily. 90 tablet 1   rosuvastatin (CRESTOR) 5 MG tablet TAKE 1 TABLET BY MOUTH Monday, Wednesday, AND Friday ONLY (Patient not taking: Reported on 12/27/2020) 50 tablet 3   No facility-administered medications prior to visit.        Objective:   Physical Exam Vitals:   12/27/20 1421  BP: 118/76  Pulse: 60  Temp: 98.2 F (36.8 C)  TempSrc: Temporal  SpO2: 98%  Weight: 178 lb 6.4 oz (80.9 kg)  Height: 6' (1.829 m)   Gen: Pleasant, well-nourished, in no distress,  normal affect  ENT: No lesions,  mouth clear,  oropharynx clear, no postnasal drip  Neck: No JVD, no stridor  Lungs: No use of accessory muscles, no crackles or wheezing on normal respiration, no wheeze on forced expiration  Cardiovascular: RRR, heart sounds normal, no murmur or gallops, no peripheral edema  Musculoskeletal: No deformities, no cyanosis or clubbing  Neuro: alert, awake, non focal  Skin: Warm, no lesions or rash      Assessment & Plan:  SOB (shortness of breath) Etiology unclear.  He believes it may correlate with bradycardia, possibly associated desaturation.  Cardiac work-up has been reassuring in the past and he is about to undergo a repeat coronary CT.  We will obtain pulmonary function testing, assess him for occult desaturation on ambulation.  Unclear whether possible Raynaud's phenomenon in his left fingers may be relevant here.  No evidence for PAH on his prior work-up.  We will perform pulmonary function testing in next office visit. We will perform walking oximetry on room air today. Get your coronary CT scan as planned.  We will review it together next time. Follow with Dr. Lamonte Sakai next available with full pulmonary function testing on the same day.    Baltazar Apo, MD, PhD 12/27/2020, 2:56 PM [facility] [phone] or if no answer before 7:00PM call [phone] For any issues after 7:00PM  please call eLink [phone]

## 2020-12-27 NOTE — Patient Instructions (Signed)
We will perform pulmonary function testing in next office visit. We will perform walking oximetry on room air today. Get your coronary CT scan as planned.  We will review it together next time. Follow with Dr. Lamonte Sakai next available with full pulmonary function testing on the same day.

## 2020-12-27 NOTE — Addendum Note (Signed)
Addended by: Dessie Coma on: 12/27/2020 03:11 PM   Modules accepted: Orders

## 2020-12-27 NOTE — Assessment & Plan Note (Signed)
Etiology unclear.  He believes it may correlate with bradycardia, possibly associated desaturation.  Cardiac work-up has been reassuring in the past and he is about to undergo a repeat coronary CT.  We will obtain pulmonary function testing, assess him for occult desaturation on ambulation.  Unclear whether possible Raynaud's phenomenon in his left fingers may be relevant here.  No evidence for PAH on his prior work-up.  We will perform pulmonary function testing in next office visit. We will perform walking oximetry on room air today. Get your coronary CT scan as planned.  We will review it together next time. Follow with Dr. Lamonte Sakai next available with full pulmonary function testing on the same day.

## 2021-01-02 NOTE — Telephone Encounter (Signed)
  Spoke with patient and reviewed below recommendation  Per patient no more discoloration and tingling improved but still has occasionally. He would like to just wait to see if symptoms continue to improve. Will call back if anything further needed    Skeet Latch, MD to Me      12:51 PM The ultrasound of his arm showed that he is normal blood flow.  If he iscontinue to have symptoms, recommend referral to neurology to better assess.

## 2021-01-04 ENCOUNTER — Encounter (HOSPITAL_COMMUNITY): Payer: Self-pay

## 2021-01-04 ENCOUNTER — Telehealth (HOSPITAL_COMMUNITY): Payer: Self-pay | Admitting: Emergency Medicine

## 2021-01-04 NOTE — Telephone Encounter (Signed)
Attempted to call patient regarding upcoming cardiac CT appointment. °Left message on voicemail with name and callback number °Kyra Laffey RN Navigator Cardiac Imaging °[facility] °[phone] Office °[phone] Cell ° °

## 2021-01-04 NOTE — Telephone Encounter (Signed)
Reaching out to patient to offer assistance regarding upcoming cardiac imaging study; pt verbalizes understanding of appt date/time, parking situation and where to check in, pre-test NPO status and medications ordered, and verified current allergies; name and call back number provided for further questions should they arise Haydin Dunn RN Navigator Cardiac Imaging [facility] [phone] office [phone] cell  Daily meds per usual 

## 2021-01-08 ENCOUNTER — Other Ambulatory Visit: Payer: Self-pay

## 2021-01-08 ENCOUNTER — Ambulatory Visit (HOSPITAL_COMMUNITY)
Admission: RE | Admit: 2021-01-08 | Discharge: 2021-01-08 | Disposition: A | Discharge status: Home / Self Care | Payer: BC Managed Care – PPO | Source: Ambulatory Visit | Attending: Cardiovascular Disease | Admitting: Cardiovascular Disease

## 2021-01-08 ENCOUNTER — Ambulatory Visit (HOSPITAL_COMMUNITY)
Admission: RE | Admit: 2021-01-08 | Discharge: 2021-01-08 | Disposition: A | Discharge status: Home / Self Care | Payer: BC Managed Care – PPO | Source: Ambulatory Visit | Attending: Cardiology | Admitting: Cardiology

## 2021-01-08 ENCOUNTER — Encounter (HOSPITAL_COMMUNITY): Payer: Self-pay

## 2021-01-08 ENCOUNTER — Other Ambulatory Visit: Payer: Self-pay | Admitting: Cardiology

## 2021-01-08 ENCOUNTER — Other Ambulatory Visit (HOSPITAL_COMMUNITY): Payer: Self-pay | Admitting: Emergency Medicine

## 2021-01-08 DIAGNOSIS — E7800 Pure hypercholesterolemia, unspecified: Secondary | ICD-10-CM | POA: Diagnosis present

## 2021-01-08 DIAGNOSIS — R931 Abnormal findings on diagnostic imaging of heart and coronary circulation: Secondary | ICD-10-CM

## 2021-01-08 DIAGNOSIS — R072 Precordial pain: Secondary | ICD-10-CM | POA: Insufficient documentation

## 2021-01-08 DIAGNOSIS — R002 Palpitations: Secondary | ICD-10-CM

## 2021-01-08 DIAGNOSIS — R079 Chest pain, unspecified: Secondary | ICD-10-CM | POA: Insufficient documentation

## 2021-01-08 DIAGNOSIS — R0602 Shortness of breath: Secondary | ICD-10-CM | POA: Diagnosis present

## 2021-01-08 DIAGNOSIS — I10 Essential (primary) hypertension: Secondary | ICD-10-CM

## 2021-01-08 DIAGNOSIS — I2510 Atherosclerotic heart disease of native coronary artery without angina pectoris: Secondary | ICD-10-CM

## 2021-01-08 MED ORDER — NITROGLYCERIN 0.4 MG SL SUBL
0.8000 mg | SUBLINGUAL_TABLET | Freq: Once | SUBLINGUAL | Status: AC
  Administered 2021-01-08: 0.8 mg via SUBLINGUAL

## 2021-01-08 MED ORDER — NITROGLYCERIN 0.4 MG SL SUBL
SUBLINGUAL_TABLET | SUBLINGUAL | Status: AC
  Filled 2021-01-08: qty 2

## 2021-01-08 MED ORDER — IOHEXOL 350 MG/ML SOLN
95.0000 mL | Freq: Once | INTRAVENOUS | Status: AC | PRN
  Administered 2021-01-08: 95 mL via INTRAVENOUS

## 2021-01-08 NOTE — Progress Notes (Signed)
Patient tolerated CT well.  Vital signs stable encourage to drink water throughout day.Reasons explained and verbalized understanding.   

## 2021-01-10 ENCOUNTER — Encounter (HOSPITAL_BASED_OUTPATIENT_CLINIC_OR_DEPARTMENT_OTHER): Payer: Self-pay

## 2021-01-22 ENCOUNTER — Encounter: Payer: Self-pay | Admitting: *Deleted

## 2021-01-22 ENCOUNTER — Other Ambulatory Visit (HOSPITAL_BASED_OUTPATIENT_CLINIC_OR_DEPARTMENT_OTHER): Payer: Self-pay | Admitting: *Deleted

## 2021-01-22 ENCOUNTER — Telehealth (HOSPITAL_BASED_OUTPATIENT_CLINIC_OR_DEPARTMENT_OTHER): Payer: Self-pay | Admitting: *Deleted

## 2021-01-22 ENCOUNTER — Ambulatory Visit (INDEPENDENT_AMBULATORY_CARE_PROVIDER_SITE_OTHER): Payer: BC Managed Care – PPO

## 2021-01-22 ENCOUNTER — Other Ambulatory Visit (HOSPITAL_BASED_OUTPATIENT_CLINIC_OR_DEPARTMENT_OTHER): Payer: Self-pay | Admitting: Family

## 2021-01-22 DIAGNOSIS — I493 Ventricular premature depolarization: Secondary | ICD-10-CM

## 2021-01-22 DIAGNOSIS — R002 Palpitations: Secondary | ICD-10-CM

## 2021-01-22 NOTE — Telephone Encounter (Signed)
Lvm for pt to let pt know the office does not send a monitor to a PO box if pt had alternate address.  Orders placed for zio monitor dx: palpitations and will send instructions for monitor on mychart message.

## 2021-01-22 NOTE — Telephone Encounter (Signed)
Pt calling back about monitor.  Pt did give alternate address [address], [place], [place] [address] but pt can pick up monitor tomorrow at [address] with instructions attached. Sent message to Markus Daft, monitor coordinator to see if this is possible.  Sent to precert.  Rescheduled pt's upcoming appointment with Laurann Montana, NP due to pt will still be wearing monitor.  New appt scheduled.

## 2021-01-22 NOTE — Progress Notes (Unsigned)
Patient enrolled for 14 day ZIO XT [id number].  Patient will pick monitor up at [facility] office, front desk on 01/23/21. Letter with instructions enclosed in monitor package.

## 2021-01-22 NOTE — Telephone Encounter (Signed)
Can we please facilitate a ZIO-XT monitor be mailed to Sean Mckay for palpitations?   Thanks! Loel Dubonnet, NP

## 2021-01-22 NOTE — Telephone Encounter (Signed)
Lvm for pt to call back Zio's now can be mailed to po boxes.  Waiting to see if pt still wants to come to [address] tomorrow or get monitor mailed to house.

## 2021-01-23 DIAGNOSIS — R002 Palpitations: Secondary | ICD-10-CM | POA: Diagnosis not present

## 2021-01-23 DIAGNOSIS — I493 Ventricular premature depolarization: Secondary | ICD-10-CM

## 2021-01-24 ENCOUNTER — Other Ambulatory Visit: Payer: Self-pay | Admitting: *Deleted

## 2021-01-24 MED ORDER — ASPIRIN EC 81 MG PO TBEC: 81.0000 mg | DELAYED_RELEASE_TABLET | Freq: Every day | ORAL | 3 refills | Status: DC

## 2021-02-05 ENCOUNTER — Ambulatory Visit (HOSPITAL_BASED_OUTPATIENT_CLINIC_OR_DEPARTMENT_OTHER): Payer: BC Managed Care – PPO | Admitting: Family

## 2021-02-10 NOTE — Progress Notes (Signed)
Office Visit    Patient Name: Sean Mckay Date of Encounter: 02/11/2021  PCP:  Sean Rasmussen, MD   [place]  Cardiologist:  Sean Latch, MD  Advanced Practice Provider:  No care team member to display Electrophysiologist:  None   Chief Complaint    Sean Mckay is a 55 y.o. male with a hx of HTN, HLD, palpitations, CAD, aortic atherosclerosis presents today for follow up after cardiac CTA and ZIO.   Past Medical History    Past Medical History:  Diagnosis Date   Adrenal adenoma 05/09/2016   Essential hypertension 05/09/2016   Hyperlipidemia    Hypertriglyceridemia 05/09/2016   Palpitations 12/06/2020   Pure hypercholesterolemia 12/06/2020   Past Surgical History:  Procedure Laterality Date   HERNIA REPAIR  02/22/09   RIH    Allergies  Allergies  Allergen Reactions   Hydralazine Other (See Comments)    Fatigue and muscle/joint pain Other reaction(s): Arthralgias (intolerance) Fatigue, myalgias   Lisinopril Other (See Comments)    Dizziness    Norvasc [Amlodipine Besylate]     dizziness   Prednisone Other (See Comments)    "vision problems" Other reaction(s): Other (See Comments), Other (See Comments) Blurred vision Visual problem when taken orally    Promethazine Hcl    Statins Other (See Comments)    aches Other reaction(s): Myalgias (intolerance)   Ezetimibe-Simvastatin Rash   Promethazine Nausea Only    Other reaction(s): Other (See Comments) Unknown reaction     History of Present Illness    [name] is a 55 y.o. male with a hx of HTN, HLD, palpitations, CAD, aortic atherosclerosis  last seen 12/06/20 by Sean Mckay.  He has established with Sean Mckay due to poorly controlled hypotension and multiple medication intolerances. Previous renal artery dopplers with no evidence of renal artery stenosis. Previous carotid dopplers due to syncope with no obstruction. Nuclear stress test negative  for ischemia. Previous holter monitor years ago with PAC's and PVCs. Cardiac CTA 2017 with coronary calcium score of 0 and pulmonary artery mildly dilated at 3.1x2.7 cm. Sleep study 2017 no evidence of OSA. 48-hour holter with PAC and PVC with no significant arrhythmia. Echo 04/13/18 normal LVEF, trivial TR.   Previous intolerance to HCTZ, increased doses of Sean Mckay, Zetia, Amlodipine (syncope), Lisinopril (dizziness), Nifedipine (fatigue, SOB).  He was last seen 12/06/20. Noted episode of weird sensation in his chest like a "flipping" associated with lightheadedness and pre-syncope. Also noted shortes of breath at rest and on exertion. His heart rate was low and BB discontinued by primary care. He was able to resume at a lower dose. Lexiscan myoview ordered for chest pain. He was started on Rosuvastatin '5mg'$  three times per week.  He had numbness in his hand after Sean Mckay with no evidence of DVT no significant arterial obstruction bilaterally. Lexiscan myoview was poor quality study. Subsequent cardiac CTA with stable small hiatal hernia, coronary calcium score of 24.9 plaicng him in 65th percentile, moderate (50-69%) mixed plaque stenosis in prox RCA - overall CAD-RADS 3 moderate stenosis.  He contacted the office noting palpitations and wore ZIO for 6 days. Average heart rate 68bpm. Predominantly NSR. PAC and PVC with <1% burden, no significant arrhythmia. E had 55 triggered events during wear time which were predominantly associated with NSR or PVCs.   He presents today for follow up. We reviewed cardiac CTA and ZIO monitor. Has been busy caretaking for his kitten who has a feeding tube  at this time. Notes continued palpitations occurring almost daily. Will be short lasting but will tirgger an involuntary cough. He saw another provider at a functional medicine practice at the end of July and his TSH was normal and Sean Mckay was 306 and Sean Mckay 240. He has upcoming follow up with their office.  He notes no change with reducing caffeine intake. Drinks alcohol only on weekends (2 beers) with no changes in palpitations. Does not drink water but plans to add sparkling waters. No OTC proarrhytmic agents. Reports no shortness of breath nor dyspnea on exertion. Reports no chest pain, pressure, or tightness. No edema, orthopnea, PND.   EKGs/Labs/Other Studies Reviewed:   The following studies were reviewed today: Cardiac CTA 01/08/21    FINDINGS: Image quality: Good   Coronary Arteries:  Normal coronary origin.  Right dominance.   Left main: The left main is a large caliber vessel with a normal take off from the left coronary cusp that trifurcates into a LAD, LCX, and ramus intermedius. There is no plaque or stenosis.   Left anterior descending artery: The LAD has minimal (0-24%) soft plaque in the proximal vessel.   Ramus intermedius: Branching vessel; patent with no evidence of plaque or stenosis.   Left circumflex artery: The LCX is non-dominant and patent with no evidence of plaque or stenosis. The LCX gives off 2 large patent obtuse marginal branches.   Right coronary artery: The RCA is dominant with normal take off from the right coronary cusp. There is moderate (50-69%) mixed plaque stenosis in the proximal vessel. The RCA terminates as a small PDA and right posterolateral branch.   Right Atrium: Right atrial size is within normal limits.   Right Ventricle: The right ventricular cavity is within normal limits.   Left Atrium: Left atrial size is normal in size with no left atrial appendage filling defect.   Left Ventricle: The ventricular cavity size is within normal limits. There are no stigmata of prior infarction. There is no abnormal filling defect.   Pulmonary veins: Normal pulmonary venous drainage.   Pericardium: Normal thickness with no significant effusion or calcium present.   Cardiac valves: The aortic valve is trileaflet without  significant calcification. The mitral valve is normal structure without significant calcification.   Aorta: Normal caliber with no significant disease.   Extra-cardiac findings: See attached radiology report for non-cardiac structures.   IMPRESSION: 1. Coronary calcium score of 24.9. This was 30 percentile for age-, sex, and race-matched controls.   2. Normal coronary origin with right dominance.   3. Moderate (50-69%) mixed plaque stenosis in the proximal RCA.   4. Study will be sent for FFR.   RECOMMENDATIONS: CAD-RADS 3: Moderate stenosis. Consider symptom-guided anti-ischemic pharmacotherapy as well as risk factor modification per guideline directed care. Additional analysis with CT FFR will be submitted.   FINDINGS: Image quality: Good   Coronary Arteries:  Normal coronary origin.  Right dominance.   Left main: The left main is a large caliber vessel with a normal take off from the left coronary cusp that trifurcates into a LAD, LCX, and ramus intermedius. There is no plaque or stenosis.   Left anterior descending artery: The LAD has minimal (0-24%) soft plaque in the proximal vessel.   Ramus intermedius: Branching vessel; patent with no evidence of plaque or stenosis.   Left circumflex artery: The LCX is non-dominant and patent with no evidence of plaque or stenosis. The LCX gives off 2 large patent obtuse marginal branches.   Right  coronary artery: The RCA is dominant with normal take off from the right coronary cusp. There is moderate (50-69%) mixed plaque stenosis in the proximal vessel. The RCA terminates as a small PDA and right posterolateral branch.   Right Atrium: Right atrial size is within normal limits.   Right Ventricle: The right ventricular cavity is within normal limits.   Left Atrium: Left atrial size is normal in size with no left atrial appendage filling defect.   Left Ventricle: The ventricular cavity size is within normal  limits. There are no stigmata of prior infarction. There is no abnormal filling defect.   Pulmonary veins: Normal pulmonary venous drainage.   Pericardium: Normal thickness with no significant effusion or calcium present.   Cardiac valves: The aortic valve is trileaflet without significant calcification. The mitral valve is normal structure without significant calcification.   Aorta: Normal caliber with no significant disease.   Extra-cardiac findings: See attached radiology report for non-cardiac structures.   IMPRESSION: 1. Coronary calcium score of 24.9. This was 63 percentile for age-, sex, and race-matched controls.   2. Normal coronary origin with right dominance.   3. Moderate (50-69%) mixed plaque stenosis in the proximal RCA.   4. Study will be sent for FFR.   RECOMMENDATIONS: CAD-RADS 3: Moderate stenosis. Consider symptom-guided anti-ischemic pharmacotherapy as well as risk factor modification per guideline directed care. Additional analysis with CT FFR will be submitted.    1. Left Main: findings 0.99, 0.99 0.99   2. LAD: findings 0.97, 0.91 0.86   3. LCX: findings 0.99, 0.85 0.86; OM2: 0.84, 0.80, 0.77   4. Ramus: findings 0.98, 0.97 0.95   5. RCA: findings 0.98, 0.87 0.86   IMPRESSION: FFR abnormal in very distal OM2 but otherwise normal; medical therapy likely best option.  Lexiscan Myoview 12/03/15: LVEF 61%.  No ischemia.   Carotid artery Doppler 12/05/15: 1-39% ICA stenosis bilaterally   Echo 12/03/15: Normal LV size and function.  Mild MR, trace TR.   Echo 04/13/18: LVEF 55-60%.  Trivial TR.   48 Hour Event Monitor 04/13/18:   Quality: Fair.  Baseline artifact. Predominant rhythm: sinus rhythm Average heart rate: 67 bpm Max heart rate: 120 bpm Min heart rate: 47 bpm   Rare PVCs and occasional PACs 3 beats of NSVT   Patient reported episodes of chest pain and jaw pain, at which time sinus rhythm was noted.  EKG:  No EKG  today  Recent Labs: 12/07/2020: ALT 15; BUN 11; Creatinine, Ser 0.94; Hemoglobin 15.0; Magnesium 2.0; Platelets 331; Potassium 4.4; Sodium 140; TSH 2.820  Recent Lipid Panel    Component Value Date/Time   CHOL 252 (H) 12/07/2020 0856   TRIG 189 (H) 12/07/2020 0856   HDL 43 12/07/2020 0856   CHOLHDL 5.9 (H) 12/07/2020 0856   LDLCALC 174 (H) 12/07/2020 0856    Home Medications   No outpatient medications have been marked as taking for the 02/11/21 encounter (Office Visit) with Loel Dubonnet, NP.     Review of Systems   All other systems reviewed and are otherwise negative except as noted above.  Physical Exam    VS:  BP 118/80   Pulse 74   Ht 6' (1.829 m)   Wt 178 lb (80.7 kg)   SpO2 96%   BMI 24.14 kg/m  , BMI Body mass index is 24.14 kg/m.  Wt Readings from Last 3 Encounters:  02/11/21 178 lb (80.7 kg)  12/27/20 178 lb 6.4 oz (80.9 kg)  12/12/20 181  lb (82.1 kg)     GEN: Well nourished, well developed, in no acute distress. HEENT: normal. Neck: Supple, no JVD, carotid bruits, or masses. Cardiac: RRR, no murmurs, rubs, or gallops. No clubbing, cyanosis, edema.  Radials/PT 2+ and equal bilaterally.  Respiratory:  Respirations regular and unlabored, clear to auscultation bilaterally. GI: Soft, nontender, nondistended. MS: No deformity or atrophy. Skin: Warm and dry, no rash. Neuro:  Strength and sensation are intact. Psych: Normal affect.  Assessment & Plan    Palpitations - ZIO monitor with predominantly NSR, average heart rate 68bpm, <1% PAC and PVC burden. Triggered episodes associated with NSR and PVC/PAC. Discussed benign nature of PVC/PAC. Encouraged to increase hydration. No change with reduced caffeine use. 11/2020 normal TSH, electrolytes. Previous bradycardia with increased dose Bystolic. Continue Bystolic '5mg'$  QD. Start Diltiazem '120mg'$  QD. Reduce Valsartan to '40mg'$  QD to prevent hypotension. Did previously note dizziness with Amlodipine, likely hypotension,  Valsartan reduced to prevent recurrence.  CAD / Aortic atherosclerosis - Cardiac CTA 01/08/21 with moderate RCA stenosis. GDMT includes Sean Mckay, Rosuvastatin, Aspirin. Heart healthy diet and regular cardiovascular exercise encouraged.    HTN - BP well controlled. Adjustment of antihypertensive regimen to prevent hypotension, as above. .    HLD, LDL goal <70 -  12/07/20 LDL 174. Started on Rosuvastatin 3 times per week. He is tolerating well.  Plan to repeat lipid panel at end of the month. If LDL not at goal, anticipate increasing frequency of Rosuvastatin. Does not previously tolerated Welchol but we discussed this is not very helpful for LDL lowering.   ED - Notes ED. Rx Sildenafil '50mg'$  PRN for ED. 10 tablets prescribed, if helpful will defer further refills to primary care. If no improvement with sildenafil, refer to urology.  Disposition: Follow up in 2 month(s) with Sean Mckay or APP.  Signed, Loel Dubonnet, NP 02/11/2021, 10:05 AM [place]

## 2021-02-11 ENCOUNTER — Ambulatory Visit (HOSPITAL_BASED_OUTPATIENT_CLINIC_OR_DEPARTMENT_OTHER): Payer: BC Managed Care – PPO | Admitting: Family

## 2021-02-11 ENCOUNTER — Telehealth: Payer: Self-pay | Admitting: *Deleted

## 2021-02-11 ENCOUNTER — Encounter (HOSPITAL_BASED_OUTPATIENT_CLINIC_OR_DEPARTMENT_OTHER): Payer: Self-pay

## 2021-02-11 ENCOUNTER — Other Ambulatory Visit: Payer: Self-pay

## 2021-02-11 ENCOUNTER — Encounter (HOSPITAL_BASED_OUTPATIENT_CLINIC_OR_DEPARTMENT_OTHER): Payer: Self-pay | Admitting: Family

## 2021-02-11 ENCOUNTER — Other Ambulatory Visit (HOSPITAL_BASED_OUTPATIENT_CLINIC_OR_DEPARTMENT_OTHER): Payer: Self-pay | Admitting: Family

## 2021-02-11 VITALS — BP 118/80 | HR 74 | Ht 72.0 in | Wt 178.0 lb

## 2021-02-11 DIAGNOSIS — E785 Hyperlipidemia, unspecified: Secondary | ICD-10-CM

## 2021-02-11 DIAGNOSIS — I10 Essential (primary) hypertension: Secondary | ICD-10-CM

## 2021-02-11 DIAGNOSIS — I700 Atherosclerosis of aorta: Secondary | ICD-10-CM

## 2021-02-11 DIAGNOSIS — I25118 Atherosclerotic heart disease of native coronary artery with other forms of angina pectoris: Secondary | ICD-10-CM | POA: Diagnosis not present

## 2021-02-11 DIAGNOSIS — N529 Male erectile dysfunction, unspecified: Secondary | ICD-10-CM

## 2021-02-11 DIAGNOSIS — R002 Palpitations: Secondary | ICD-10-CM | POA: Diagnosis not present

## 2021-02-11 MED ORDER — DILTIAZEM HCL ER COATED BEADS 120 MG PO CP24: 120.0000 mg | ORAL_CAPSULE | Freq: Every day | ORAL | 2 refills | Status: DC

## 2021-02-11 MED ORDER — VALSARTAN 80 MG PO TABS: 40.0000 mg | ORAL_TABLET | Freq: Every day | ORAL | 1 refills | Status: DC

## 2021-02-11 MED ORDER — TADALAFIL 10 MG PO TABS: 10.0000 mg | ORAL_TABLET | Freq: Every day | ORAL | 0 refills | Status: DC | PRN

## 2021-02-11 MED ORDER — SILDENAFIL CITRATE 50 MG PO TABS: 50.0000 mg | ORAL_TABLET | Freq: Every day | ORAL | 0 refills | Status: DC | PRN

## 2021-02-11 NOTE — Telephone Encounter (Signed)
Updated Rx sent.   Loel Dubonnet, NP

## 2021-02-11 NOTE — Telephone Encounter (Signed)
Sildenafil Rx removed. Instead Rx sent for Tadalafil '10mg'$  PRN for ED.  Updated patient via [place].   Loel Dubonnet, NP

## 2021-02-11 NOTE — Patient Instructions (Addendum)
Medication Instructions:   Your physician has recommended you make the following change in your medication:   REDUCE your Valsartan to half tablet ('40mg'$ ) once daily  START Diltiazem (Cardizem) one '120mg'$  tablet daily  START Sildenafil one '50mg'$  tablet as needed  *If you need a refill on your cardiac medications before your next appointment, please call your pharmacy*  Lab Work: Your physician recommends that you return for lab work at the end of August as previously recommended.  Testing/Procedures: Your ZIO monitor showed occasional early beats called PACs and PVCs which were occurring less than 1% of the time.  Your cardiac CTA showed moderate nonobstructive plaque in your heart. Your aspirin and rosuvastatin will help prevent this from progressing.    Follow-Up: At [facility], you and your health needs are our priority.  As part of our continuing mission to provide you with exceptional heart care, we have created designated Provider Care Teams.  These Care Teams include your primary Cardiologist (physician) and Advanced Practice Providers (APPs -  Physician Assistants and Nurse Practitioners) who all work together to provide you with the care you need, when you need it.  We recommend signing up for the patient portal called "MyChart".  Sign up information is provided on this After Visit Summary.  MyChart is used to connect with patients for Virtual Visits (Telemedicine).  Patients are able to view lab/test results, encounter notes, upcoming appointments, etc.  Non-urgent messages can be sent to your provider as well.   To learn more about what you can do with MyChart, go to [email or web address].    Your next appointment:   2 month(s)  The format for your next appointment:   In Person  Provider:   Skeet Latch, MD   Other Instructions  To prevent palpitations: Make sure you are adequately hydrated.  Avoid and/or limit caffeine containing beverages like soda or  tea. Exercise regularly.  Manage stress well. Some over the counter medications can cause palpitations such as Benadryl, AdvilPM, TylenolPM. Regular Advil or Tylenol do not cause palpitations.    Heart Healthy Diet Recommendations: A low-salt diet is recommended. Meats should be grilled, baked, or boiled. Avoid fried foods. Focus on lean protein sources like fish or chicken with vegetables and fruits. The American Heart Association is a Microbiologist!    Exercise recommendations: The American Heart Association recommends 150 minutes of moderate intensity exercise weekly. Try 30 minutes of moderate intensity exercise 4-5 times per week. This could include walking, jogging, or swimming.

## 2021-02-11 NOTE — Telephone Encounter (Signed)
Received fax from pharmacy stating patient's insurance will not cover Sildenafil 50 mg. However they will cover Tadalafil or Vardenafil tablets. Please advise and send to selected pharmacy on file.

## 2021-02-12 ENCOUNTER — Other Ambulatory Visit: Payer: Self-pay

## 2021-02-12 MED ORDER — TADALAFIL 10 MG PO TABS: 10.0000 mg | ORAL_TABLET | Freq: Every day | ORAL | 0 refills | Status: DC | PRN

## 2021-02-12 MED ORDER — DILTIAZEM HCL ER COATED BEADS 120 MG PO CP24: 120.0000 mg | ORAL_CAPSULE | Freq: Every day | ORAL | 2 refills | Status: DC

## 2021-02-12 NOTE — Addendum Note (Signed)
Addended by: Loel Dubonnet on: 02/12/2021 07:42 AM   Modules accepted: Orders

## 2021-02-14 ENCOUNTER — Ambulatory Visit (INDEPENDENT_AMBULATORY_CARE_PROVIDER_SITE_OTHER): Payer: BC Managed Care – PPO | Admitting: Emergency Medicine

## 2021-02-14 ENCOUNTER — Ambulatory Visit: Payer: BC Managed Care – PPO | Admitting: Emergency Medicine

## 2021-02-14 ENCOUNTER — Other Ambulatory Visit: Payer: Self-pay

## 2021-02-14 ENCOUNTER — Encounter: Payer: Self-pay | Admitting: Emergency Medicine

## 2021-02-14 DIAGNOSIS — R0602 Shortness of breath: Secondary | ICD-10-CM

## 2021-02-14 LAB — PULMONARY FUNCTION TEST
DL/VA % pred: 98 %
DL/VA: 4.23 ml/min/mmHg/L
DLCO cor % pred: 94 %
DLCO cor: 28.87 ml/min/mmHg
DLCO unc % pred: 94 %
DLCO unc: 28.87 ml/min/mmHg
FEF 25-75 Post: 3.49 L/sec
FEF 25-75 Pre: 3.16 L/sec
FEF2575-%Change-Post: 10 %
FEF2575-%Pred-Post: 100 %
FEF2575-%Pred-Pre: 91 %
FEV1-%Change-Post: 1 %
FEV1-%Pred-Post: 87 %
FEV1-%Pred-Pre: 85 %
FEV1-Post: 3.55 L
FEV1-Pre: 3.49 L
FEV1FVC-%Change-Post: 6 %
FEV1FVC-%Pred-Pre: 102 %
FEV6-%Change-Post: -4 %
FEV6-%Pred-Post: 82 %
FEV6-%Pred-Pre: 87 %
FEV6-Post: 4.22 L
FEV6-Pre: 4.43 L
FEV6FVC-%Pred-Post: 103 %
FEV6FVC-%Pred-Pre: 103 %
FVC-%Change-Post: -4 %
FVC-%Pred-Post: 79 %
FVC-%Pred-Pre: 83 %
FVC-Post: 4.22 L
FVC-Pre: 4.43 L
Post FEV1/FVC ratio: 84 %
Post FEV6/FVC ratio: 100 %
Pre FEV1/FVC ratio: 79 %
Pre FEV6/FVC Ratio: 100 %
RV % pred: 98 %
RV: 2.19 L
TLC % pred: 95 %
TLC: 7.04 L

## 2021-02-14 NOTE — Progress Notes (Signed)
Subjective:    Patient ID: Sean Mckay, male    DOB: 05-22-1966, 55 y.o.   MRN: [id number]  HPI  55 year old never smoker with a history of hypertension, hyperlipidemia, palpitations, adrenal adenoma.  His blood pressures been difficult to manage due to multiple medication intolerances.  No evidence of renal artery stenosis.  He is here for dyspnea w exertion, happens when he does yard work. Began to be problematic 2017. Sometimes has dyspnea when he walks on flat ground, always on an incline, always w stairs. Gets a scratchy throat w dusts, etc. He believes that he has seen some intermittent bradycardia to 30's, sometimes associated w desats. No cough, does have some mid chest tightness. Can feel a flutter in the chest, can last a minute, associated with a "sinking feeling".   ? Raynaud's in fingers L hand. Happened 2 weeks ago. Duplex was OK on 12/12/20   Lexiscan Myoview 12/03/2015 was a reassuring study, no ischemia Echocardiogram 04/13/2018 showed normal LV function, normal RV size and function Cardiopulmonary exercise test 05/12/2018 reviewed showed normal airflows prior to exercise with an elevated FEV1 and FVC ratio 92%, normal functional capacity, no indication of cardiopulmonary limitation, consistent with deconditioning  ROV 02/14/2021 --follow-up visit for further evaluation of shortness of breath.  He has a history of hypertension, palpitations.  He has a reassuring Lexiscan and echocardiogram from 2017 and 2019.  He underwent coronary calcium scoring CT that showed possible distal OM 2 disease but otherwise normal, calcium score 24.9 (65%).  No evidence interstitial lung disease.  He is doing fairly well. He still has some SOB, feels it   Underwent pulmonary function testing today which I have reviewed, shows normal airflows without a bronchodilator response, normal lung volumes, normal diffusion capacity and a normal flow volume loop.   Review of Systems As per HPI  Past  Medical History:  Diagnosis Date   Adrenal adenoma 05/09/2016   Essential hypertension 05/09/2016   Hyperlipidemia    Hypertriglyceridemia 05/09/2016   Palpitations 12/06/2020   Pure hypercholesterolemia 12/06/2020     Family History  Problem Relation Age of Onset   Cancer Father        leukemia   Heart attack Father    Heart attack Mother    Cancer Paternal Grandmother        colon   Sudden death Brother    Heart attack Maternal Grandfather    Stroke Maternal Grandmother    Aneurysm Maternal Grandmother      Social History   Socioeconomic History   Marital status: Married    Spouse name: Not on file   Number of children: Not on file   Years of education: Not on file   Highest education level: Not on file  Occupational History   Occupation: Architect: Scientist, water quality  Tobacco Use   Smoking status: Never   Smokeless tobacco: Never  Substance and Sexual Activity   Alcohol use: No   Drug use: No   Sexual activity: Not on file  Other Topics Concern   Not on file  Social History Narrative   Not on file   Social Determinants of Health   Financial Resource Strain: Not on file  Food Insecurity: Not on file  Transportation Needs: Not on file  Physical Activity: Not on file  Stress: Not on file  Social Connections: Not on file  Intimate Partner Violence: Not on file    [place] native No [employer]  security systems.  No exposures No mold Asbestos exposure in the past.    Allergies  Allergen Reactions   Hydralazine Other (See Comments)    Fatigue and muscle/joint pain Other reaction(s): Arthralgias (intolerance) Fatigue, myalgias   Lisinopril Other (See Comments)    Dizziness    Norvasc [Amlodipine Besylate]     dizziness   Prednisone Other (See Comments)    "vision problems" Other reaction(s): Other (See Comments), Other (See Comments) Blurred vision Visual problem when taken orally    Promethazine Hcl     Statins Other (See Comments)    aches Other reaction(s): Myalgias (intolerance)   Ezetimibe-Simvastatin Rash   Promethazine Nausea Only    Other reaction(s): Other (See Comments) Unknown reaction      Outpatient Medications Prior to Visit  Medication Sig Dispense Refill   aspirin EC 81 MG tablet Take 1 tablet (81 mg total) by mouth daily. Swallow whole. 90 tablet 3   Cholecalciferol (VITAMIN D3) 250 MCG (10000 UT) capsule Take 10,000 Units by mouth daily.     cloNIDine (CATAPRES) 0.1 MG tablet Take 1 tablet by mouth daily as needed.     Coenzyme Q10 (COQ10) 100 MG CAPS Take 100 mg by mouth daily.     DHEA 25 MG CAPS Take 25 mg by mouth daily.     diltiazem (CARDIZEM CD) 120 MG 24 hr capsule Take 1 capsule (120 mg total) by mouth daily. 30 capsule 2   levothyroxine (SYNTHROID, LEVOTHROID) 112 MCG tablet Take 1 tablet by mouth daily.  5   loperamide (IMODIUM A-D) 2 MG tablet Take 2 mg by mouth 2 (two) times daily.     Nebivolol HCl (BYSTOLIC PO) Take 5 mg by mouth daily.     omega-3 acid ethyl esters (LOVAZA) 1 g capsule Take 1 g by mouth 2 (two) times daily.     rosuvastatin (CRESTOR) 5 MG tablet TAKE 1 TABLET BY MOUTH Monday, Wednesday, AND Friday ONLY 50 tablet 3   tadalafil (CIALIS) 10 MG tablet Take 1 tablet (10 mg total) by mouth daily as needed for erectile dysfunction. 10 tablet 0   valsartan (DIOVAN) 80 MG tablet Take 0.5 tablets (40 mg total) by mouth daily. 90 tablet 1   vitamin B-12 (CYANOCOBALAMIN) 1000 MCG tablet Take 1,000 mcg by mouth daily.     vitamin E 180 MG (400 UNITS) capsule Take 400 Units by mouth daily.     zinc gluconate 50 MG tablet Take 50 mg by mouth daily.     No facility-administered medications prior to visit.        Objective:   Physical Exam Vitals:   02/14/21 1555  BP: 116/72  Pulse: 69  Temp: 97.9 F (36.6 C)  TempSrc: Oral  SpO2: 95%  Weight: 181 lb (82.1 kg)  Height: 6' (1.829 m)   Gen: Pleasant, well-nourished, in no distress,   normal affect  ENT: No lesions,  mouth clear,  oropharynx clear, no postnasal drip  Neck: No JVD, no stridor  Lungs: No use of accessory muscles, no crackles or wheezing on normal respiration, no wheeze on forced expiration  Cardiovascular: RRR, heart sounds normal, no murmur or gallops, no peripheral edema  Musculoskeletal: No deformities, no cyanosis or clubbing  Neuro: alert, awake, non focal  Skin: Warm, no lesions or rash      Assessment & Plan:  SOB (shortness of breath) Reassuring pulmonary evaluation, normal PFT, normal CT.  Cardiac evaluation with some CAD that is going to be managed medically,  PVCs.  I think at this point he can try to work on his exercise and conditioning, see if he can boost his functional capacity and improve his sensation of dyspnea.  We reviewed her pulmonary function testing today.  These showed normal airflows, lung volumes and diffusion.  Great news. Try to start slowly and steadily increasing your exercise routine to build up your cardiopulmonary conditioning and stamina.  I think that this is going to help your breathing. Continue to follow with cardiology as planned Follow Dr. Lamonte Sakai if you have any changes in your breathing or new respiratory symptoms  Time spent 34 minutes  Baltazar Apo, MD, PhD 02/14/2021, 5:32 PM [facility] [phone] or if no answer before 7:00PM call [phone] For any issues after 7:00PM please call eLink [phone]

## 2021-02-14 NOTE — Patient Instructions (Signed)
We reviewed her pulmonary function testing today.  These showed normal airflows, lung volumes and diffusion.  Great news. Try to start slowly and steadily increasing your exercise routine to build up your cardiopulmonary conditioning and stamina.  I think that this is going to help your breathing. Continue to follow with cardiology as planned Follow Dr. Lamonte Sakai if you have any changes in your breathing or new respiratory symptoms.

## 2021-02-14 NOTE — Assessment & Plan Note (Signed)
Reassuring pulmonary evaluation, normal PFT, normal CT.  Cardiac evaluation with some CAD that is going to be managed medically, PVCs.  I think at this point he can try to work on his exercise and conditioning, see if he can boost his functional capacity and improve his sensation of dyspnea.  We reviewed her pulmonary function testing today.  These showed normal airflows, lung volumes and diffusion.  Great news. Try to start slowly and steadily increasing your exercise routine to build up your cardiopulmonary conditioning and stamina.  I think that this is going to help your breathing. Continue to follow with cardiology as planned Follow Dr. Lamonte Sakai if you have any changes in your breathing or new respiratory symptoms

## 2021-02-14 NOTE — Progress Notes (Signed)
Full PFT performed today. °

## 2021-02-14 NOTE — Patient Instructions (Signed)
Full PFT performed today. °

## 2021-02-15 ENCOUNTER — Encounter (HOSPITAL_BASED_OUTPATIENT_CLINIC_OR_DEPARTMENT_OTHER): Payer: Self-pay

## 2021-04-16 LAB — COMPREHENSIVE METABOLIC PANEL
ALT: 24 IU/L (ref 0–44)
AST: 24 IU/L (ref 0–40)
Albumin/Globulin Ratio: 1.6 (ref 1.2–2.2)
Albumin: 4.4 g/dL (ref 3.8–4.9)
Alkaline Phosphatase: 53 IU/L (ref 44–121)
BUN/Creatinine Ratio: 10 (ref 9–20)
BUN: 9 mg/dL (ref 6–24)
Bilirubin Total: 0.6 mg/dL (ref 0.0–1.2)
CO2: 21 mmol/L (ref 20–29)
Calcium: 9.3 mg/dL (ref 8.7–10.2)
Chloride: 102 mmol/L (ref 96–106)
Creatinine, Ser: 0.9 mg/dL (ref 0.76–1.27)
Globulin, Total: 2.7 g/dL (ref 1.5–4.5)
Glucose: 108 mg/dL — ABNORMAL HIGH (ref 70–99)
Potassium: 4.6 mmol/L (ref 3.5–5.2)
Sodium: 138 mmol/L (ref 134–144)
Total Protein: 7.1 g/dL (ref 6.0–8.5)
eGFR: 101 mL/min/{1.73_m2} (ref >59)

## 2021-04-16 LAB — LIPID PANEL
Chol/HDL Ratio: 5.4 ratio — ABNORMAL HIGH (ref 0.0–5.0)
Cholesterol, Total: 237 mg/dL — ABNORMAL HIGH (ref 100–199)
HDL: 44 mg/dL (ref >39)
LDL Chol Calc (NIH): 130 mg/dL — ABNORMAL HIGH (ref 0–99)
Triglycerides: 351 mg/dL — ABNORMAL HIGH (ref 0–149)
VLDL Cholesterol Cal: 63 mg/dL — ABNORMAL HIGH (ref 5–40)

## 2021-04-29 ENCOUNTER — Ambulatory Visit
Admission: RE | Admit: 2021-04-29 | Discharge: 2021-04-29 | Disposition: A | Discharge status: Home / Self Care | Payer: BC Managed Care – PPO | Source: Ambulatory Visit | Attending: Family Medicine | Admitting: Family Medicine

## 2021-04-29 ENCOUNTER — Ambulatory Visit (HOSPITAL_BASED_OUTPATIENT_CLINIC_OR_DEPARTMENT_OTHER): Payer: BC Managed Care – PPO | Admitting: Cardiovascular Disease

## 2021-04-29 ENCOUNTER — Encounter (HOSPITAL_BASED_OUTPATIENT_CLINIC_OR_DEPARTMENT_OTHER): Payer: Self-pay | Admitting: Cardiovascular Disease

## 2021-04-29 ENCOUNTER — Other Ambulatory Visit: Payer: Self-pay | Admitting: Family Medicine

## 2021-04-29 ENCOUNTER — Other Ambulatory Visit: Payer: Self-pay

## 2021-04-29 VITALS — BP 126/80 | HR 63 | Ht 72.0 in | Wt 183.5 lb

## 2021-04-29 DIAGNOSIS — E7800 Pure hypercholesterolemia, unspecified: Secondary | ICD-10-CM

## 2021-04-29 DIAGNOSIS — I10 Essential (primary) hypertension: Secondary | ICD-10-CM

## 2021-04-29 DIAGNOSIS — S8992XA Unspecified injury of left lower leg, initial encounter: Secondary | ICD-10-CM

## 2021-04-29 DIAGNOSIS — I2510 Atherosclerotic heart disease of native coronary artery without angina pectoris: Secondary | ICD-10-CM | POA: Diagnosis not present

## 2021-04-29 DIAGNOSIS — E785 Hyperlipidemia, unspecified: Secondary | ICD-10-CM

## 2021-04-29 DIAGNOSIS — R002 Palpitations: Secondary | ICD-10-CM

## 2021-04-29 MED ORDER — NEBIVOLOL HCL 10 MG PO TABS: 10.0000 mg | ORAL_TABLET | Freq: Every day | ORAL | 3 refills | Status: DC

## 2021-04-29 MED ORDER — VALSARTAN 80 MG PO TABS: 40.0000 mg | ORAL_TABLET | Freq: Every day | ORAL | 1 refills | Status: DC

## 2021-04-29 NOTE — Patient Instructions (Addendum)
Medication Instructions:  STOP DILTIAZEM   INCREASE YOUR NEBIVOLOL TO 10 MG DAILY   *If you need a refill on your cardiac medications before your next appointment, please call your pharmacy*  Lab Work: NONE   Testing/Procedures: NONE  Follow-Up: At [facility], you and your health needs are our priority.  As part of our continuing mission to provide you with exceptional heart care, we have created designated Provider Care Teams.  These Care Teams include your primary Cardiologist (physician) and Advanced Practice Providers (APPs -  Physician Assistants and Nurse Practitioners) who all work together to provide you with the care you need, when you need it.  We recommend signing up for the patient portal called "MyChart".  Sign up information is provided on this After Visit Summary.  MyChart is used to connect with patients for Virtual Visits (Telemedicine).  Patients are able to view lab/test results, encounter notes, upcoming appointments, etc.  Non-urgent messages can be sent to your provider as well.   To learn more about what you can do with MyChart, go to [email or web address].    Your next appointment:   3 month(s)  The format for your next appointment:   In Person  Provider:   Skeet Latch, MD  Your physician recommends that you schedule a follow-up appointment in: [place]
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
## 2021-04-30 ENCOUNTER — Encounter (HOSPITAL_BASED_OUTPATIENT_CLINIC_OR_DEPARTMENT_OTHER): Payer: Self-pay | Admitting: Cardiovascular Disease

## 2021-04-30 DIAGNOSIS — I2510 Atherosclerotic heart disease of native coronary artery without angina pectoris: Secondary | ICD-10-CM

## 2021-04-30 HISTORY — DX: Atherosclerotic heart disease of native coronary artery without angina pectoris: I25.10

## 2021-04-30 NOTE — Assessment & Plan Note (Signed)
Minimal LAD and moderate RCA stenosis on coronary CTA 2022.  Medical management with nebivolol.  Continue aspirin.  Lipids are poorly controlled on rosuvastatin and he is unable to tolerate higher doses.  We will refer him to our pharmacist to consider injectable therapies.

## 2021-04-30 NOTE — Assessment & Plan Note (Signed)
Blood pressures well have been controlled but he is having a lot of palpitations likely attributable to his uncontrolled thyroid disease.  We will increase nebivolol to 10 mg and stop the diltiazem as it has not helped his palpitations.  No significant arrhythmias were noted on his ambulatory monitor.  Continue valsartan.

## 2021-04-30 NOTE — Assessment & Plan Note (Signed)
Lipids are poorly controlled.  His LDL should be less than 70.  Continue rosuvastatin.  He has been tried on multiple statins and is unable to tolerate any higher doses of rosuvastatin as he is already having mild achiness.  He will need to be considered for either a PCSK9 inhibitor or Inclisiran.

## 2021-04-30 NOTE — Assessment & Plan Note (Signed)
Increase nebivolol to 10mg .

## 2021-05-01 ENCOUNTER — Telehealth: Payer: Self-pay | Admitting: Cardiovascular Disease

## 2021-05-01 ENCOUNTER — Encounter (HOSPITAL_BASED_OUTPATIENT_CLINIC_OR_DEPARTMENT_OTHER): Payer: Self-pay

## 2021-05-01 DIAGNOSIS — E785 Hyperlipidemia, unspecified: Secondary | ICD-10-CM

## 2021-05-01 NOTE — Telephone Encounter (Signed)
He already has appt scheduled

## 2021-05-01 NOTE — Telephone Encounter (Signed)
Referral to lipid clinic was made on 10/17, do not see that appt has been scheduled yet.  Sean Mckay, are you able to call pt to schedule him for lipid visit at [facility] office with PharmD please? Has been following with Dr Oval Linsey at [facility] but looks like previous visits have been at [facility].

## 2021-05-01 NOTE — Telephone Encounter (Signed)
Per Dr. Oval Linsey, normal kidney function, liver function, and electrolytes.  Cholesterol is better in some regards.  His LDL has improved and his HDL is stable.  However his triglycerides are much more elevated.  Recommend that he try rosuvastatin 10 mg every day and repeat labs in 2 to 3 months.   Called patient with recommendations from Dr. Oval Linsey. Patient stated he cannot increase rosuvastatin and that he was suppose to see the Lipid Clinic to discuss this. Will send message to Pharm D to see if they can call patient and get him in.

## 2021-05-01 NOTE — Telephone Encounter (Signed)
Patient returning call for lab results. 

## 2021-05-15 ENCOUNTER — Ambulatory Visit: Payer: BC Managed Care – PPO

## 2021-06-11 ENCOUNTER — Telehealth: Payer: Self-pay

## 2021-06-11 NOTE — Telephone Encounter (Signed)
   Name: Sean Mckay  DOB: [birth year]  MRN: [id number]   Primary Cardiologist: Skeet Latch, MD  Chart reviewed as part of pre-operative protocol coverage. Patient was contacted 06/11/2021 in reference to pre-operative risk assessment for pending surgery as outlined below.  Sean Mckay was last seen on 04/29/21 by Dr. Oval Linsey.  Since that day, Sean Mckay has done well.  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 Prior to his knee injury, he was able to complete more than 4.0 METS without angina. He has known DOE and has seen pulmonology.  Given his known CAD, we prefer to continue ASA throughout the perioperative period. However, if doing so significantly increases morbidity or mortality, may hold for 5-7 days.  Therefore, based on ACC/AHA guidelines, the patient would be at acceptable risk for the planned procedure without further cardiovascular testing.   The patient was advised that if he develops new symptoms prior to surgery to contact our office to arrange for a follow-up visit, and he verbalized understanding.  I will route this recommendation to the requesting party via Epic fax function and remove from pre-op pool. Please call with questions.  Ledora Bottcher, PA 06/11/2021, 3:46 PM

## 2021-06-11 NOTE — Telephone Encounter (Signed)
   [facility] Pre-operative Risk Assessment    Patient Name: Sean Mckay  DOB: [birth year] MRN: [id number]    Request for surgical clearance:  What type of surgery is being performed  Left knee arthroscopy   When is this surgery scheduled  07/10/21  What type of clearance is required   Both  Are there any medications that need to be held prior to surgery and how long  Aspirin  Practice name and name of physician performing surgery  [facility]  Dr.Steven Veverly Fells  What is the office phone number  [phone]   7.   What is the office fax number  [phone]  8.   Anesthesia type   Not listed   Kathyrn Lass 06/11/2021, 12:46 PM  _________________________________________________________________   (provider comments below)

## 2021-08-02 ENCOUNTER — Other Ambulatory Visit: Payer: Self-pay

## 2021-08-02 ENCOUNTER — Ambulatory Visit (HOSPITAL_BASED_OUTPATIENT_CLINIC_OR_DEPARTMENT_OTHER): Payer: BC Managed Care – PPO | Admitting: Cardiovascular Disease

## 2021-08-02 VITALS — BP 120/82 | HR 68 | Ht 72.0 in | Wt 183.3 lb

## 2021-08-02 DIAGNOSIS — I10 Essential (primary) hypertension: Secondary | ICD-10-CM | POA: Diagnosis not present

## 2021-08-02 DIAGNOSIS — I493 Ventricular premature depolarization: Secondary | ICD-10-CM | POA: Diagnosis not present

## 2021-08-02 DIAGNOSIS — I2510 Atherosclerotic heart disease of native coronary artery without angina pectoris: Secondary | ICD-10-CM | POA: Diagnosis not present

## 2021-08-02 DIAGNOSIS — E7800 Pure hypercholesterolemia, unspecified: Secondary | ICD-10-CM | POA: Diagnosis not present

## 2021-08-02 NOTE — Progress Notes (Signed)
Cardiology Office Note   Date:  08/03/2021   ID:  Sean Mckay, DOB [birth year], MRN [id number]  PCP:  Hayden Rasmussen, MD  Cardiologist:   Skeet Latch, MD   No chief complaint on file.   History of Present Illness: Sean Mckay is a 56 y.o. male with non-obstructive CAD, hypertension and hyperlipidemia who presents for follow up.  Mr. Sean Mckay was first seen 12/2015 due to poorly-controlled hypertension.  He has struggled with poorly-controlled hypertension and intolerance to many medications.  He had renal artery Dopplers that were negative for renal artery stenosis.  He also had carotid Dopplers due to an episode of syncope that were negative for obstruction.  He also had a nuclear stress test that was negative for ischemia. He reports wearing a 48-hour Holter years ago that showed PACs and PVCs.  Of note, Sean Mckay has a brother who died of sudden cardiac death at age 84. No autopsy was performed.  Both his mother and father have had heart attacks and bypass surgery. Maternal grandfather had a heart attack at age 32.    Sean Mckay was started on HCTZ  12.5 mg daily.  However he did not tolerate this and was started on valsartan, which was subsequently increased to 80 mg due to poor BP control.  He developed fatigue and shortness of breath on nebivolol 20mg  daily, so this was reduced.  On 03/2016 he reported episodes of sharp, atypical chest pain.  Given this as well as his family history of CAD and borderline lipids, he was referred for cardiac CT-A.   He was found to have a coronary calcium score of 0.  His pulmonary artery was noted to be mildly dilated to 3.1 x 2.7 cm.   On 04/2018 [name] reported intermittent episodes of chest pain.   Mr. Alsip followed up with his PCP regarding the symptoms.  She checked a chest x-ray that showed no acute findings but did note some old scarring in his left lung.  He thinks this is due to a prior pneumonia.  She also checked a  d-dimer that was normal.  Of note, he had a sleep study in 2017 that was negative for sleep apnea.  Given that there wa no CAD noted on coronary CT-A and he had a calcium score of 0 in 04/2016 it was felt that his symptoms were not related to ischemia.  He also reported palpitations so he was referred for 48-hour Holter monitor that showed PACs and PVCs but no significant arrhythmias.  He was referred for an echo 04/13/18 that showed normal systolic function and trivial TR but was otherwise unremarkable. He saw Sean Mckay 06/2018 and continued to have shortness of breath, so he was referred to pulmonary.   He was last seen 11/2020 and reported palpitations. He also reported hypoxia and shortness of breath. He had chest pain but given his calcium score of 0 he was not thought to be ischemic.  He had a nuclear stress test 12/2020 that had significant artifact and was uninterpretable.  He developed a complication of his left hand fingers turning purple during the nuclear stress test.  He subsequently had discomfort.  He had arterial imaging of the right upper extremity that was unremarkable.  He had a coronary CT with moderate RCA disease and minimal LAD disease. FFRCT was negative. He wore a 6-day monitor with rare PVCs.   Today, he is doing okay overall. He has been experiencing constant palpitations he  describes as a "fluttering" in his chest. These palpitations also cause shortness of breath and maybe related to his medication for his thyroid and blood pressure (diltiazem). He reports 10 mg of his bystolic was fine but increasing it to 20 mg caused increased shortness of breath. He has had elevated t3 and t4 which may be related to Hashimoto's Disease. He has LLE pain after falling in a flooded manhole. His left hand is doing better and the discoloration and numbness has not returned. 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 He denies any chest pain. No lightheadedness,  headaches, syncope, orthopnea, or PND. Also has no lower extremity edema or exertional symptoms.  At his last appointment he had an increase in palpitations which were thought to be due to his uncontrolled thyroid disease.  Nebivolol was increased to 10 mg and diltiazem was discontinued.  He has been working with his integrative medicine doctor to better control his thyroid.  Since working on this and increasing nebivolol, his palpitations have been much better.  They have not bothered him lately.  He was referred to the pharmacist for consideration of PCSK9 inhibitor but this did not occur.  For now he chooses to keep working on diet and exercise.  He has not been exercising as much lately due to a knee injury that occurred in September.  He needs to have a hernia surgery before he can address the knee.  At home his blood pressure has been well controlled.  Recent thyroid labs:  T3 2.6, T4 0.9 TPO Ab 306, thyroglobiunlin 240  Lipid Panel: Total cholesterol 210, HDL 40, LDL 100, triglycerides 340  Adverse effects:  Amlodipine: syncope Lisinopril: dizziness Hydralazine: Muscle aches, fatigue Nifedipine: fatigue, shortness of breath HCTZ: thirst, cough, tremor Nebivolol 20 mg: fatigue, shortness of breath.  Bradycardia with 10mg    Past Medical History:  Diagnosis Date   Adrenal adenoma 05/09/2016   CAD in native artery 04/30/2021   Essential hypertension 05/09/2016   Hyperlipidemia    Hypertriglyceridemia 05/09/2016   Palpitations 12/06/2020   Pure hypercholesterolemia 12/06/2020    Past Surgical History:  Procedure Laterality Date   HERNIA REPAIR  02/22/09   RIH     Current Outpatient Medications  Medication Sig Dispense Refill   aspirin EC 81 MG tablet Take 1 tablet (81 mg total) by mouth daily. Swallow whole. 90 tablet 3   Barberry-Oreg Grape-Goldenseal (BERBERINE COMPLEX PO) Take 1,000 mg by mouth daily.     Cholecalciferol (VITAMIN D3) 250 MCG (10000 UT) capsule Take 10,000  Units by mouth daily.     Coenzyme Q10 (COQ10) 100 MG CAPS Take 100 mg by mouth daily.     Cyanocobalamin (VITAMIN B-12 PO) Take 2,500 mcg by mouth daily.     DHEA 25 MG CAPS Take 25 mg by mouth daily.     Krill Oil 500 MG CAPS Take by mouth. 1 tablet in the morning and 2 tablets in the evening     loperamide (IMODIUM A-D) 2 MG tablet Take 2 mg by mouth 2 (two) times daily.     MAGNESIUM PO Take 900 mg by mouth daily.     NALTREXONE HCL PO Take 1.5 mg by mouth daily.     nebivolol (BYSTOLIC) 10 MG tablet Take 1 tablet (10 mg total) by mouth daily. 90 tablet 3   niacin 500 MG tablet Take 500 mg by mouth at bedtime.     NP THYROID 15 MG  tablet Take 15 mg by mouth every morning.     NP THYROID 60 MG tablet Take 60 mg by mouth daily.     PANTETHINE PO Take 450 mg by mouth daily.     Probiotic Product (PROBIOTIC DAILY PO) Take 2 capsules by mouth daily.     rosuvastatin (CRESTOR) 5 MG tablet TAKE 1 TABLET BY MOUTH Monday, Wednesday, AND Friday ONLY 50 tablet 3   tadalafil (CIALIS) 10 MG tablet Take 1 tablet (10 mg total) by mouth daily as needed for erectile dysfunction. 10 tablet 0   UNABLE TO FIND Take 400 mg by mouth daily. Med Name: Kelle Darting     valsartan (DIOVAN) 80 MG tablet Take 0.5 tablets (40 mg total) by mouth daily. 90 tablet 1   vitamin E 180 MG (400 UNITS) capsule Take 400 Units by mouth daily.     zinc gluconate 50 MG tablet Take 50 mg by mouth daily.     No current facility-administered medications for this visit.    Allergies:   Hydralazine, Lisinopril, Norvasc [amlodipine besylate], Prednisone, Promethazine hcl, Statins, Ezetimibe-simvastatin, and Promethazine    Social History:  The patient  reports that he has never smoked. He has never used smokeless tobacco. He reports that he does not drink alcohol and does not use drugs.   Family History:  The patient's family history includes Aneurysm in his maternal grandmother; Cancer in his father and paternal grandmother; Heart  attack in his father, maternal grandfather, and mother; Stroke in his maternal grandmother; Sudden death in his brother.    ROS:   Please see the history of present illness. (+) Palpitations (+) Shortness of Breath (+) LLE Pain All other systems are reviewed and negative.    PHYSICAL EXAM: VS:  BP 120/82 (BP Location: Left Arm, Patient Position: Sitting, Cuff Size: Normal)    Pulse 68    Ht 6' (1.829 m)    Wt 183 lb 4.8 oz (83.1 kg)    SpO2 97%    BMI 24.86 kg/m  , BMI Body mass index is 24.86 kg/m. GENERAL:  Well appearing HEENT: Pupils equal round and reactive, fundi not visualized, oral mucosa unremarkable NECK:  No jugular venous distention, waveform within normal limits, carotid upstroke brisk and symmetric, no bruits, no thyromegaly LUNGS:  Clear to auscultation bilaterally HEART:  RRR.  PMI not displaced or sustained,S1 and S2 within normal limits, no S3, no S4, no clicks, no rubs, no murmurs ABD:  Flat, positive bowel sounds normal in frequency in pitch, no bruits, no rebound, no guarding, no midline pulsatile mass, no hepatomegaly, no splenomegaly EXT:  2 plus pulses throughout, no edema, no cyanosis no clubbing SKIN:  No rashes no nodules NEURO:  Cranial nerves II through XII grossly intact, motor grossly intact throughout PSYCH:  Cognitively intact, oriented to person place and time   EKG:   04/29/2021: No EKG this visit 12/06/2020: Sinus bradycardia. Rate 59 bpm. 04/01/2018: Sinus bradycardia.  Rate 53 bpm.  Less than 1 mm ST elevation inferiorly, anteriorly, and laterally. 12/24/15: Sinus bradycardia rate 54 bpm.  R axis deviation.   Lexiscan Myoview 12/03/15:  LVEF 61%.  No ischemia.  Carotid artery Doppler 12/05/15: 1-39% ICA stenosis bilaterally  Echo 12/03/15:  Normal LV size and function.  Mild MR, trace TR.   Echo 04/13/18: LVEF 55-60%.  Trivial TR.   48 Hour Event Monitor 04/13/18:  Quality: Fair.  Baseline artifact. Predominant rhythm: sinus  rhythm Average heart rate: 67 bpm Max heart  rate: 120 bpm Min heart rate: 47 bpm  Rare PVCs and occasional PACs 3 beats of NSVT  Patient reported episodes of chest pain and jaw pain, at which time sinus rhythm was noted.  [facility]  12/12/2020 The left ventricular ejection fraction is mildly decreased (45-54%). Nuclear stress EF: 52%. No T wave inversion was noted during stress. Study is very limited due to significant extracardiac tracer uptake. Suspect normal perfusion, however, interpretation difficult due to degree of artifact. Recommend alternative ischemic evaluation if clinically indicated (cardiac MRI vs coronary CTA) given poor quality of current study.  UE Duplex 12/12/2020 Right: Normal flow seen in right mid ulnar and radial arteries.  Left: No obstruction visualized in the left upper extremity Limited        evaluation of left forearm arteries show no evidence of        stenosis.  UE Doppler 12/13/2020 Right: No significant arterial obstruction detected in the right         upper extremity.  Left: No significant arterial obstruction detected in the left upper        extremity.  6-Day ZIO Monitor 02/01/2021 Quality: Fair.  Baseline artifact. Predominant rhythm: Sinus rhythm Average heart rate: 68 bpm Max heart rate: 143 bpm Min heart rate: 46 bpm Pauses >2.5 seconds: None Rare PVCs, ventricular couplets, and ventricular triplets.   Recent Labs: 12/07/2020: Hemoglobin 15.0; Magnesium 2.0; Platelets 331; TSH 2.820 04/15/2021: ALT 24; BUN 9; Creatinine, Ser 0.90; Potassium 4.6; Sodium 138    Lipid Panel    Component Value Date/Time   CHOL 237 (H) 04/15/2021 0920   TRIG 351 (H) 04/15/2021 0920   HDL 44 04/15/2021 0920   CHOLHDL 5.4 (H) 04/15/2021 0920   LDLCALC 130 (H) 04/15/2021 0920   12/02/15: Total cholesterol 199, triglycerides 180 HDL 36, LDL 126  12/18:  TC 246 197 LDL, HDL 36, tri 194    Wt Readings from Last 3 Encounters:  08/02/21  183 lb 4.8 oz (83.1 kg)  04/29/21 183 lb 8 oz (83.2 kg)  02/14/21 181 lb (82.1 kg)     ASSESSMENT AND PLAN: No problem-specific Assessment & Plan notes found for this encounter. CAD in native artery Minimal LAD and moderate RCA stenosis on coronary CTA 2022.  Medical management with nebivolol.  Continue aspirin.  Lipids are poorly controlled on rosuvastatin and he is unable to tolerate higher doses.  We will refer him to our pharmacist to consider injectable therapies.   Essential hypertension Blood pressure is better controlled and palpitations have improved.  Continue nebivolol and valsartan.   Pure hypercholesterolemia Lipids are poorly controlled.  His LDL should be less than 70.  Triglycerides should be <150.  Continue rosuvastatin.  He has been tried on multiple statins and is unable to tolerate any higher doses of rosuvastatin as he is already having mild achiness.  We did refer him to the pharmacist for PCSK9 inhibitor but he would like to wait at this time.  Continue working on diet and exercise.   Palpitations Stable on nebivolol 10mg .  Current medicines are reviewed at length with the patient today.  The patient does not have concerns regarding medicines.  The following changes have been made:  None  Labs/ tests ordered today include:    No orders of the defined types were placed in this encounter.    Disposition:   FU with Shaye Elling C. Oval Linsey, MD, [facility] in 6 months.   Signed, Tammra Pressman C. Oval Linsey, MD, [facility]  08/03/2021  1:23 PM    [facility]

## 2021-08-02 NOTE — Patient Instructions (Signed)
Medication Instructions:  Your physician recommends that you continue on your current medications as directed. Please refer to the Current Medication list given to you today.   *If you need a refill on your cardiac medications before your next appointment, please call your pharmacy*  Lab Work: NONE  Testing/Procedures: NONE  Follow-Up: At [facility], you and your health needs are our priority.  As part of our continuing mission to provide you with exceptional heart care, we have created designated Provider Care Teams.  These Care Teams include your primary Cardiologist (physician) and Advanced Practice Providers (APPs -  Physician Assistants and Nurse Practitioners) who all work together to provide you with the care you need, when you need it.  We recommend signing up for the patient portal called "MyChart".  Sign up information is provided on this After Visit Summary.  MyChart is used to connect with patients for Virtual Visits (Telemedicine).  Patients are able to view lab/test results, encounter notes, upcoming appointments, etc.  Non-urgent messages can be sent to your provider as well.   To learn more about what you can do with MyChart, go to [email or web address].    Your next appointment:   6 month(s)  The format for your next appointment:   In Person  Provider:   Tiffany Ouray, MD            

## 2021-08-03 ENCOUNTER — Encounter (HOSPITAL_BASED_OUTPATIENT_CLINIC_OR_DEPARTMENT_OTHER): Payer: Self-pay | Admitting: Cardiovascular Disease

## 2021-08-07 ENCOUNTER — Encounter (HOSPITAL_BASED_OUTPATIENT_CLINIC_OR_DEPARTMENT_OTHER): Payer: Self-pay | Admitting: Emergency Medicine

## 2021-08-07 ENCOUNTER — Emergency Department (HOSPITAL_BASED_OUTPATIENT_CLINIC_OR_DEPARTMENT_OTHER)
Admission: EM | Admit: 2021-08-07 | Discharge: 2021-08-07 | Disposition: A | Discharge status: Home / Self Care | Payer: BC Managed Care – PPO | Attending: Emergency Medicine | Admitting: Emergency Medicine

## 2021-08-07 ENCOUNTER — Encounter (HOSPITAL_BASED_OUTPATIENT_CLINIC_OR_DEPARTMENT_OTHER): Payer: Self-pay | Admitting: Cardiovascular Disease

## 2021-08-07 ENCOUNTER — Other Ambulatory Visit: Payer: Self-pay

## 2021-08-07 DIAGNOSIS — Z79899 Other long term (current) drug therapy: Secondary | ICD-10-CM | POA: Diagnosis not present

## 2021-08-07 DIAGNOSIS — R6884 Jaw pain: Secondary | ICD-10-CM | POA: Insufficient documentation

## 2021-08-07 DIAGNOSIS — Z7982 Long term (current) use of aspirin: Secondary | ICD-10-CM | POA: Diagnosis not present

## 2021-08-07 DIAGNOSIS — I10 Essential (primary) hypertension: Secondary | ICD-10-CM | POA: Diagnosis not present

## 2021-08-07 DIAGNOSIS — M25519 Pain in unspecified shoulder: Secondary | ICD-10-CM | POA: Insufficient documentation

## 2021-08-07 DIAGNOSIS — R030 Elevated blood-pressure reading, without diagnosis of hypertension: Secondary | ICD-10-CM | POA: Diagnosis present

## 2021-08-07 LAB — CBC
HCT: 47.1 % (ref 39.0–52.0)
Hemoglobin: 15.2 g/dL (ref 13.0–17.0)
MCH: 27.9 pg (ref 26.0–34.0)
MCHC: 32.3 g/dL (ref 30.0–36.0)
MCV: 86.6 fL (ref 80.0–100.0)
Platelets: 315 10*3/uL (ref 150–400)
RBC: 5.44 MIL/uL (ref 4.22–5.81)
RDW: 12.9 % (ref 11.5–15.5)
WBC: 6.5 10*3/uL (ref 4.0–10.5)
nRBC: 0 % (ref 0.0–0.2)

## 2021-08-07 LAB — BASIC METABOLIC PANEL
Anion gap: 7 (ref 5–15)
BUN: 11 mg/dL (ref 6–20)
CO2: 29 mmol/L (ref 22–32)
Calcium: 9.7 mg/dL (ref 8.9–10.3)
Chloride: 103 mmol/L (ref 98–111)
Creatinine, Ser: 0.85 mg/dL (ref 0.61–1.24)
GFR, Estimated: >60 mL/min (ref >60)
Glucose, Bld: 99 mg/dL (ref 70–99)
Potassium: 4.4 mmol/L (ref 3.5–5.1)
Sodium: 139 mmol/L (ref 135–145)

## 2021-08-07 LAB — TROPONIN I (HIGH SENSITIVITY)
Troponin I (High Sensitivity): 3 ng/L (ref <18)
Troponin I (High Sensitivity): 2 ng/L (ref <18)

## 2021-08-07 MED ORDER — NEBIVOLOL HCL 5 MG PO TABS
10.0000 mg | ORAL_TABLET | Freq: Every day | ORAL | Status: DC
  Administered 2021-08-07: 13:00:00 10 mg via ORAL
  Filled 2021-08-07: qty 2

## 2021-08-07 NOTE — Discharge Instructions (Addendum)
Your cardiac work-up is negative today.  Your blood pressure has also come down with your medication.  At this time I believe you are stable to be discharged home with follow-up with your primary care provider and cardiologist.

## 2021-08-07 NOTE — ED Provider Notes (Signed)
[facility] EMERGENCY DEPT Provider Note   CSN: [id number] Arrival date & time: 08/07/21  1050     History  Chief Complaint  Patient presents with   Hypertension    Sean Mckay is a 56 y.o. male with a past medical history of hypertension presenting today with a concern for elevated blood pressure.  He reports that yesterday he was obtaining blood pressure readings in the 160s over 110s.  Last night he took his clonidine and began to get readings in the 110s over 80s.  This morning his pressure elevated again, he felt pain in his left jaw and shoulder blade.  He called his cardiologist who suggested that he come to the emergency department for further evaluation.  Denies chest pain, endorses some "pressure all over his body."  Says that that is how he knows his blood pressure is high.  Cardiologist manages his blood pressure, he does not have any cardiac conditions however is concerned because he has a family history of sudden cardiac death.  Denies any headache.Reports he did not get a chance to take his blood pressure medication this morning.  Hypertension      Home Medications Prior to Admission medications   Medication Sig Start Date End Date Taking? Authorizing Provider  aspirin EC 81 MG tablet Take 1 tablet (81 mg total) by mouth daily. Swallow whole. 01/24/21   Skeet Latch, MD  Barberry-Oreg Grape-Goldenseal (BERBERINE COMPLEX PO) Take 1,000 mg by mouth daily.    [provider]  Cholecalciferol (VITAMIN D3) 250 MCG (10000 UT) capsule Take 10,000 Units by mouth daily.    [provider]  Coenzyme Q10 (COQ10) 100 MG CAPS Take 100 mg by mouth daily.    [provider]  Cyanocobalamin (VITAMIN B-12 PO) Take 2,500 mcg by mouth daily.    [provider]  DHEA 25 MG CAPS Take 25 mg by mouth daily.    [provider]  Astrid Drafts 500 MG CAPS Take by mouth. 1 tablet in the morning and 2 tablets in the evening     [provider]  loperamide (IMODIUM A-D) 2 MG tablet Take 2 mg by mouth 2 (two) times daily.    [provider]  MAGNESIUM PO Take 900 mg by mouth daily.    [provider]  NALTREXONE HCL PO Take 1.5 mg by mouth daily.    [provider]  nebivolol (BYSTOLIC) 10 MG tablet Take 1 tablet (10 mg total) by mouth daily. 04/29/21   Skeet Latch, MD  niacin 500 MG tablet Take 500 mg by mouth at bedtime.    [provider]  NP THYROID 15 MG tablet Take 15 mg by mouth every morning. 04/17/21   [provider]  NP THYROID 60 MG tablet Take 60 mg by mouth daily. 04/17/21   [provider]  PANTETHINE PO Take 450 mg by mouth daily.    [provider]  Probiotic Product (PROBIOTIC DAILY PO) Take 2 capsules by mouth daily.    [provider]  rosuvastatin (CRESTOR) 5 MG tablet TAKE 1 TABLET BY MOUTH Monday, Wednesday, AND Friday ONLY 12/06/20   Skeet Latch, MD  tadalafil (CIALIS) 10 MG tablet Take 1 tablet (10 mg total) by mouth daily as needed for erectile dysfunction. 02/12/21   Loel Dubonnet, NP  UNABLE TO FIND Take 400 mg by mouth daily. Med Name: Kelle Darting    [provider]  valsartan (DIOVAN) 80 MG tablet Take 0.5 tablets (  40 mg total) by mouth daily. 04/29/21   Skeet Latch, MD  vitamin E 180 MG (400 UNITS) capsule Take 400 Units by mouth daily.    [provider]  zinc gluconate 50 MG tablet Take 50 mg by mouth daily.    [provider]      Allergies    Hydralazine, Lisinopril, Norvasc [amlodipine besylate], Prednisone, Promethazine hcl, Statins, Ezetimibe-simvastatin, and Promethazine    Review of Systems   Review of Systems  Physical Exam Updated Vital Signs BP (!) 141/93    Pulse (!) 56    Temp 98.2 F (36.8 C)    Resp 13    Ht 6' (1.829 m)    Wt 83 kg    SpO2 98%    BMI 24.82 kg/m  Physical Exam Vitals and nursing note reviewed.  Constitutional:       Appearance: Normal appearance.  HENT:     Head: Normocephalic and atraumatic.     Mouth/Throat:     Mouth: Mucous membranes are moist.     Pharynx: Oropharynx is clear.  Eyes:     General: No scleral icterus.    Conjunctiva/sclera: Conjunctivae normal.  Cardiovascular:     Rate and Rhythm: Normal rate and regular rhythm.     Heart sounds: No murmur heard.   No gallop.  Pulmonary:     Effort: Pulmonary effort is normal. No respiratory distress.     Breath sounds: No wheezing.  Abdominal:     General: Abdomen is flat.     Tenderness: There is no abdominal tenderness.  Musculoskeletal:        General: No tenderness.  Skin:    General: Skin is warm and dry.     Findings: No rash.  Neurological:     Mental Status: He is alert.  Psychiatric:        Mood and Affect: Mood normal.    ED Results / Procedures / Treatments   Labs (all labs ordered are listed, but only abnormal results are displayed) Labs Reviewed  BASIC METABOLIC PANEL  CBC    EKG EKG Interpretation  Date/Time:  Wednesday August 07 2021 11:05:29 EST Ventricular Rate:  66 PR Interval:  172 QRS Duration: 94 QT Interval:  364 QTC Calculation: 381 R Axis:   81 Text Interpretation: Normal sinus rhythm with sinus arrhythmia Cannot rule out Anterior infarct , age undetermined Abnormal ECG No prior ECG for comparison. No STEMI Confirmed by Antony Blackbird [phone]) on 08/07/2021 11:10:10 AM  Radiology No results found.  Procedures Procedures    Medications Ordered in ED Medications - No data to display  ED Course/ Medical Decision Making/ A&P                           Medical Decision Making Amount and/or Complexity of Data Reviewed Labs: ordered.  Risk Prescription drug management.   Patient presents to the ED for concern of high BP/cp, this involves an extensive number of treatment options, and is a complaint that carries with it a high risk of complications and morbidity.  The emergent differential  diagnosis of chest pain includes: Acute coronary syndrome, pericarditis, aortic dissection, pulmonary embolism, tension pneumothorax, and esophageal rupture.   Co morbidities that complicate the patient evaluation  Hypertension  Additional history obtained:  Additional history obtained from review of cardiology notes as well as CTA showing moderate RCA stenosis.   Lab Tests:  I Ordered, and personally interpreted labs.  The pertinent results include: Negative troponin x2.  EKG without concerns, confirmed by Dr. Sherry Ruffing   Cardiac Monitoring:  The patient was maintained on a cardiac monitor.  I personally viewed and interpreted the cardiac monitored which showed an underlying rhythm of: Normal sinus   Medicines ordered and prescription drug management:  I ordered medication including at home Bystolic for hypertension Reevaluation of the patient after these medicines showed that the patient's blood pressure is down to normal.   Problem List / ED Course:  HTN, Chest pain   Dispostion:  After consideration of the diagnostic results and the patients response to treatment, I feel that the patent would benefit from follow-up with a cardiologist who serves as his primary care provider.  He is agreeable to this plan.  Return precautions discussed.  His blood pressure is within normal limits after his at home regimen.  No signs of MI.  Heart score 3.  To be discharged at this time.   Final Clinical Impression(s) / ED Diagnoses Final diagnoses:  Primary hypertension    Rx / DC Orders Results and diagnoses were explained to the patient. Return precautions discussed in full. Patient had no additional questions and expressed complete understanding.   This chart was dictated using voice recognition software.  Despite best efforts to proofread,  errors can occur which can change the documentation meaning.      Darliss Ridgel 08/07/21 1643    Tegeler, Gwenyth Allegra,  MD 08/08/21 [phone]

## 2021-08-07 NOTE — ED Triage Notes (Signed)
Was feeling off last night and took his pressure snd it was up , took a clonidine  was down at 2am and now back up , has had n.v and h/a , pain in left jaw ,chest lightheaded

## 2021-08-07 NOTE — Telephone Encounter (Signed)
Called patient in regards to my chart message.    Patient states he Typically runs 122/80 No chest pain a general heaviness in the chest and head. Also has on "off feeling pain" in his jaw and feels jittery.    "My BP has been running normal for some time. Last night I wasn't feeling well and took my BP. It was 153/107. I waited a bit and took it again. 174/116. I took a Clonidine tablet and waited about 30 minutes. It dropped slightly to 173/110. In about 2 hours I checked again and 117/82. I woke this morning feeling unwell again and checked my BP. It is 152/102. I need some guidance please."   Consulted with DOD Harrell Gave and advised patient to be seen in the ED. Gave directions to [facility] ED

## 2021-08-14 ENCOUNTER — Other Ambulatory Visit: Payer: Self-pay

## 2021-08-14 ENCOUNTER — Ambulatory Visit (HOSPITAL_BASED_OUTPATIENT_CLINIC_OR_DEPARTMENT_OTHER): Payer: BC Managed Care – PPO | Admitting: Family

## 2021-08-14 ENCOUNTER — Encounter (HOSPITAL_BASED_OUTPATIENT_CLINIC_OR_DEPARTMENT_OTHER): Payer: Self-pay | Admitting: Family

## 2021-08-14 VITALS — BP 116/74 | HR 78 | Ht 72.0 in | Wt 186.0 lb

## 2021-08-14 DIAGNOSIS — I25118 Atherosclerotic heart disease of native coronary artery with other forms of angina pectoris: Secondary | ICD-10-CM

## 2021-08-14 DIAGNOSIS — E785 Hyperlipidemia, unspecified: Secondary | ICD-10-CM | POA: Diagnosis not present

## 2021-08-14 DIAGNOSIS — I10 Essential (primary) hypertension: Secondary | ICD-10-CM | POA: Diagnosis not present

## 2021-08-14 DIAGNOSIS — R002 Palpitations: Secondary | ICD-10-CM

## 2021-08-14 MED ORDER — ROSUVASTATIN CALCIUM 5 MG PO TABS: 5.0000 mg | ORAL_TABLET | Freq: Every day | ORAL | 3 refills | Status: DC

## 2021-08-14 MED ORDER — VALSARTAN 80 MG PO TABS: 80.0000 mg | ORAL_TABLET | Freq: Every day | ORAL | 1 refills | Status: DC

## 2021-08-14 MED ORDER — NITROGLYCERIN 0.4 MG SL SUBL: 0.4000 mg | SUBLINGUAL_TABLET | SUBLINGUAL | 3 refills | Status: DC | PRN

## 2021-08-14 NOTE — Progress Notes (Signed)
Office Visit    Patient Name: Sean Mckay Date of Encounter: 08/14/2021  PCP:  Hayden Rasmussen, MD   [facility]  Cardiologist:  Skeet Latch, MD  Advanced Practice Provider:  No care team member to display Electrophysiologist:  None   Chief Complaint    Sean Mckay is a 56 y.o. male with a hx of HTN, HLD, palpitations, CAD, aortic atherosclerosis presents today for elevated blood pressure.  Past Medical History    Past Medical History:  Diagnosis Date   Adrenal adenoma 05/09/2016   CAD in native artery 04/30/2021   Essential hypertension 05/09/2016   Hyperlipidemia    Hypertriglyceridemia 05/09/2016   Palpitations 12/06/2020   Pure hypercholesterolemia 12/06/2020   Past Surgical History:  Procedure Laterality Date   HERNIA REPAIR  02/22/09   RIH    Allergies  Allergies  Allergen Reactions   Hydralazine Other (See Comments)    Fatigue and muscle/joint pain Other reaction(s): Arthralgias (intolerance) Fatigue, myalgias   Lisinopril Other (See Comments)    Dizziness    Norvasc [Amlodipine Besylate]     dizziness   Prednisone Other (See Comments)    "vision problems" Other reaction(s): Other (See Comments), Other (See Comments) Blurred vision Visual problem when taken orally    Promethazine Hcl    Statins Other (See Comments)    aches Other reaction(s): Myalgias (intolerance)   Ezetimibe-Simvastatin Rash   Promethazine Nausea Only    Other reaction(s): Other (See Comments) Unknown reaction     History of Present Illness    Sean Mckay is a 56 y.o. male with a hx of HTN, HLD, palpitations, CAD, aortic atherosclerosis  last seen 12/06/20 by Dr. Oval Linsey.  He has established with Dr. Oval Linsey due to poorly controlled hypotension and multiple medication intolerances. Previous renal artery dopplers with no evidence of renal artery stenosis. Previous carotid dopplers due to syncope with no obstruction. Nuclear  stress test negative for ischemia. Previous holter monitor years ago with PAC's and PVCs. Cardiac CTA 2017 with coronary calcium score of 0 and pulmonary artery mildly dilated at 3.1x2.7 cm. Sleep study 2017 no evidence of OSA. 48-hour holter with PAC and PVC with no significant arrhythmia. Echo 04/13/18 normal LVEF, trivial TR.   Previous intolerance to HCTZ, increased doses of Nebivolol, Zetia, Amlodipine (syncope), Lisinopril (dizziness), Nifedipine (fatigue, SOB).  He seen 12/06/20. Noted episode of weird sensation in his chest like a "flipping" associated with lightheadedness and pre-syncope and shortness of breath. Lexiscan myoview ordered for chest pain. He was started on Rosuvastatin 5mg  three times per week. He had numbness in his hand after [place]ane Call with no evidence of DVT no significant arterial obstruction bilaterally. Lexiscan myoview was poor quality study. Subsequent cardiac CTA with stable small hiatal hernia, coronary calcium score of 24.9 plaicng him in 65th percentile, moderate (50-69%) mixed plaque stenosis in prox RCA - overall CAD-RADS 3 moderate stenosis. He contacted the office noting palpitations and wore ZIO for 6 days. Average heart rate 68bpm. Predominantly NSR. PAC and PVC with <1% burden, no significant arrhythmia. E had 55 triggered events during wear time which were predominantly associated with NSR or PVCs.   Presents today for follow up after ED visit for elevated BP associated with chest pain. Tells me   started feeling chest pressure in the evening. It continued to escalate. Checked BP and it was higher than his usual. No stressors, med changes, dietary changes. Took a clonidine 0.1mg  (previously discontinued Rx) without  change. Left shoulder, and jaw were hurting. ED workup negative HS troponin x2, EKG no acute changes, BP improved with home medications.   He presents today for follow up. Notes BP "spikes" with no clear cause. He also has asymptomatic hypotension  with BP reading such as 98/75. He is tolerating Crestor 5mg  three times per week. Tells me primary care gave him 1 dose of Repatha in clinic and it didn't lower his cholesterol. We discussed that recommended wait after 4-6 doses prior to checking. He is not interested in pursuing injectable therapy at this time.   EKGs/Labs/Other Studies Reviewed:   The following studies were reviewed today: Cardiac CTA 01/08/21    FINDINGS: Image quality: Good   Coronary Arteries:  Normal coronary origin.  Right dominance.   Left main: The left main is a large caliber vessel with a normal take off from the left coronary cusp that trifurcates into a LAD, LCX, and ramus intermedius. There is no plaque or stenosis.   Left anterior descending artery: The LAD has minimal (0-24%) soft plaque in the proximal vessel.   Ramus intermedius: Branching vessel; patent with no evidence of plaque or stenosis.   Left circumflex artery: The LCX is non-dominant and patent with no evidence of plaque or stenosis. The LCX gives off 2 large patent obtuse marginal branches.   Right coronary artery: The RCA is dominant with normal take off from the right coronary cusp. There is moderate (50-69%) mixed plaque stenosis in the proximal vessel. The RCA terminates as a small PDA and right posterolateral branch.   Right Atrium: Right atrial size is within normal limits.   Right Ventricle: The right ventricular cavity is within normal limits.   Left Atrium: Left atrial size is normal in size with no left atrial appendage filling defect.   Left Ventricle: The ventricular cavity size is within normal limits. There are no stigmata of prior infarction. There is no abnormal filling defect.   Pulmonary veins: Normal pulmonary venous drainage.   Pericardium: Normal thickness with no significant effusion or calcium present.   Cardiac valves: The aortic valve is trileaflet without significant calcification. The mitral valve  is normal structure without significant calcification.   Aorta: Normal caliber with no significant disease.   Extra-cardiac findings: See attached radiology report for non-cardiac structures.   IMPRESSION: 1. Coronary calcium score of 24.9. This was 26 percentile for age-, sex, and race-matched controls.   2. Normal coronary origin with right dominance.   3. Moderate (50-69%) mixed plaque stenosis in the proximal RCA.   4. Study will be sent for FFR.   RECOMMENDATIONS: CAD-RADS 3: Moderate stenosis. Consider symptom-guided anti-ischemic pharmacotherapy as well as risk factor modification per guideline directed care. Additional analysis with CT FFR will be submitted.   FINDINGS: Image quality: Good   Coronary Arteries:  Normal coronary origin.  Right dominance.   Left main: The left main is a large caliber vessel with a normal take off from the left coronary cusp that trifurcates into a LAD, LCX, and ramus intermedius. There is no plaque or stenosis.   Left anterior descending artery: The LAD has minimal (0-24%) soft plaque in the proximal vessel.   Ramus intermedius: Branching vessel; patent with no evidence of plaque or stenosis.   Left circumflex artery: The LCX is non-dominant and patent with no evidence of plaque or stenosis. The LCX gives off 2 large patent obtuse marginal branches.   Right coronary artery: The RCA is dominant with normal take off  from the right coronary cusp. There is moderate (50-69%) mixed plaque stenosis in the proximal vessel. The RCA terminates as a small PDA and right posterolateral branch.   Right Atrium: Right atrial size is within normal limits.   Right Ventricle: The right ventricular cavity is within normal limits.   Left Atrium: Left atrial size is normal in size with no left atrial appendage filling defect.   Left Ventricle: The ventricular cavity size is within normal limits. There are no stigmata of prior infarction. There  is no abnormal filling defect.   Pulmonary veins: Normal pulmonary venous drainage.   Pericardium: Normal thickness with no significant effusion or calcium present.   Cardiac valves: The aortic valve is trileaflet without significant calcification. The mitral valve is normal structure without significant calcification.   Aorta: Normal caliber with no significant disease.   Extra-cardiac findings: See attached radiology report for non-cardiac structures.   IMPRESSION: 1. Coronary calcium score of 24.9. This was 72 percentile for age-, sex, and race-matched controls.   2. Normal coronary origin with right dominance.   3. Moderate (50-69%) mixed plaque stenosis in the proximal RCA.   4. Study will be sent for FFR.   RECOMMENDATIONS: CAD-RADS 3: Moderate stenosis. Consider symptom-guided anti-ischemic pharmacotherapy as well as risk factor modification per guideline directed care. Additional analysis with CT FFR will be submitted.    1. Left Main: findings 0.99, 0.99 0.99   2. LAD: findings 0.97, 0.91 0.86   3. LCX: findings 0.99, 0.85 0.86; OM2: 0.84, 0.80, 0.77   4. Ramus: findings 0.98, 0.97 0.95   5. RCA: findings 0.98, 0.87 0.86   IMPRESSION: FFR abnormal in very distal OM2 but otherwise normal; medical therapy likely best option.  Lexiscan Myoview 12/03/15: LVEF 61%.  No ischemia.   Carotid artery Doppler 12/05/15: 1-39% ICA stenosis bilaterally   Echo 12/03/15: Normal LV size and function.  Mild MR, trace TR.   Echo 04/13/18: LVEF 55-60%.  Trivial TR.   48 Hour Event Monitor 04/13/18:   Quality: Fair.  Baseline artifact. Predominant rhythm: sinus rhythm Average heart rate: 67 bpm Max heart rate: 120 bpm Min heart rate: 47 bpm   Rare PVCs and occasional PACs 3 beats of NSVT   Patient reported episodes of chest pain and jaw pain, at which time sinus rhythm was noted.  EKG:  No EKG today   Recent Labs: 12/07/2020: Magnesium 2.0; TSH  2.820 04/15/2021: ALT 24 08/07/2021: BUN 11; Creatinine, Ser 0.85; Hemoglobin 15.2; Platelets 315; Potassium 4.4; Sodium 139  Recent Lipid Panel    Component Value Date/Time   CHOL 237 (H) 04/15/2021 0920   TRIG 351 (H) 04/15/2021 0920   HDL 44 04/15/2021 0920   CHOLHDL 5.4 (H) 04/15/2021 0920   LDLCALC 130 (H) 04/15/2021 0920    Home Medications   Current Meds  Medication Sig   aspirin EC 81 MG tablet Take 1 tablet (81 mg total) by mouth daily. Swallow whole.   Barberry-Oreg Grape-Goldenseal (BERBERINE COMPLEX PO) Take 1,000 mg by mouth daily.   Cholecalciferol (VITAMIN D3) 250 MCG (10000 UT) capsule Take 10,000 Units by mouth daily.   Coenzyme Q10 (COQ10) 100 MG CAPS Take 100 mg by mouth daily.   Cyanocobalamin (VITAMIN B-12 PO) Take 2,500 mcg by mouth daily.   DHEA 25 MG CAPS Take 25 mg by mouth daily.   Krill Oil 500 MG CAPS Take by mouth. 1 tablet in the morning and 2 tablets in the evening   loperamide (IMODIUM A-D)  2 MG tablet Take 2 mg by mouth 2 (two) times daily.   MAGNESIUM PO Take 900 mg by mouth daily.   NALTREXONE HCL PO Take 1.5 mg by mouth daily.   nebivolol (BYSTOLIC) 10 MG tablet Take 1 tablet (10 mg total) by mouth daily.   niacin 500 MG tablet Take 500 mg by mouth at bedtime.   NP THYROID 15 MG tablet Take 15 mg by mouth every morning.   NP THYROID 60 MG tablet Take 60 mg by mouth daily.   PANTETHINE PO Take 450 mg by mouth daily.   Probiotic Product (PROBIOTIC DAILY PO) Take 2 capsules by mouth daily.   rosuvastatin (CRESTOR) 5 MG tablet TAKE 1 TABLET BY MOUTH Monday, Wednesday, AND Friday ONLY   sildenafil (VIAGRA) 50 MG tablet Take 25 mg by mouth daily as needed for erectile dysfunction.   tadalafil (CIALIS) 10 MG tablet Take 1 tablet (10 mg total) by mouth daily as needed for erectile dysfunction.   UNABLE TO FIND Take 400 mg by mouth daily. Med Name: Kelle Darting   valsartan (DIOVAN) 80 MG tablet Take 0.5 tablets (40 mg total) by mouth daily.   vitamin E  180 MG (400 UNITS) capsule Take 400 Units by mouth daily.   zinc gluconate 50 MG tablet Take 50 mg by mouth daily.     Review of Systems   All other systems reviewed and are otherwise negative except as noted above.  Physical Exam    VS:  BP 116/74    Pulse 78    Ht 6' (1.829 m)    Wt 186 lb (84.4 kg)    BMI 25.23 kg/m  , BMI Body mass index is 25.23 kg/m.  Wt Readings from Last 3 Encounters:  08/14/21 186 lb (84.4 kg)  08/07/21 182 lb 15.7 oz (83 kg)  08/02/21 183 lb 4.8 oz (83.1 kg)    GEN: Well nourished, well developed, in no acute distress. HEENT: normal. Neck: Supple, no JVD, carotid bruits, or masses. Cardiac: RRR, no murmurs, rubs, or gallops. No clubbing, cyanosis, edema.  Radials/PT 2+ and equal bilaterally.  Respiratory:  Respirations regular and unlabored, clear to auscultation bilaterally. GI: Soft, nontender, nondistended. MS: No deformity or atrophy. Skin: Warm and dry, no rash. Neuro:  Strength and sensation are intact. Psych: Normal affect.  Assessment & Plan    Palpitations - ZIO monitor with predominantly NSR, average heart rate 68bpm, <1% PAC and PVC burden. Triggered episodes associated with NSR and PVC/PAC.  Continue Bystolic 10mg  QD. Recommend stay well hydrated, avoid caffeine, manage stress well.   CAD / Aortic atherosclerosis - Cardiac CTA 01/08/21 with moderate RCA stenosis. GDMT includes Nebivolol, Rosuvastatin, Aspirin. Heart healthy diet and regular cardiovascular exercise encouraged.  Rx PRN Nitroglycerin. Isolated episode of chest pain in setting of elevated BP. Atypical for angina as occurred at rest and not with exertion. He will report recurrent chest pain. Home Bp cuff found to read 10 points higher on systolic.   HTN - BP well controlled in clinic though notes BP spikes as well as asymptomatic hypotension at home. Will transition his Valsartan to evening dosing and continue Bystolic in the AM. He will contact the office for recurrent  hypertension.    HLD, LDL goal <70 -  12/07/20 LDL 174. Started on Rosuvastatin 3 times per week. He is tolerating well. 04/2021 LDL 130. He prefers to avoid PCSK9 injection. Will change Crestor to 5mg  QD with labs in 3 months. He will contact the  office if he notes myalgias.   ED - Continue to follow with PCP.   Disposition: Follow up July 2023 with Dr. Oval Linsey or APP.  Signed, Loel Dubonnet, NP 08/14/2021, 2:27 PM [place]

## 2021-08-14 NOTE — Patient Instructions (Addendum)
Medication Instructions:  Your physician has recommended you make the following change in your medication:   CHANGE Rosuvastatin (Crestor) *take one tablet 5 times per week for 2 weeks then increase to one tablet daily  CONTINUE Valsartan 80mg  daily  START Nitroglycerin as needed for chest pain  For as needed Nitroglycerin, if you develop chest pain: Sit and rest 5 minutes. If chest pain does not resolve place 1 nitroglycerin under your tongue and wait 5 minutes. If chest pain does not resolve, place a 2nd nitroglycerin under your tongue and wait 5 more minutes. If chest pain does not resolve, place a 3rd nitroglycerin under your tongue and seek emergency services.     *If you need a refill on your cardiac medications before your next appointment, please call your pharmacy*   Lab Work: Your physician recommends that you return for lab work in 3 months for fasting lipid panel and CMET.   Please return for Lab work. You may come to the...   [facility] (3rd floor) [address] [place], [place] [address]  Open: 8am-Noon and 1pm-4:30pm   [place] [facility] [place]- Any location  **no appointments needed**    If you have labs (blood work) drawn today and your tests are completely normal, you will receive your results only by: [employer] (if you have MyChart) OR A paper copy in the mail If you have any lab test that is abnormal or we need to change your treatment, we will call you to review the results.   Testing/Procedures: Your EKG in the emergency department looked good!   Follow-Up: At [facility], you and your health needs are our priority.  As part of our continuing mission to provide you with exceptional heart care, we have created designated Provider Care Teams.  These Care Teams include your primary Cardiologist (physician) and Advanced Practice Providers (APPs -  Physician Assistants and  Nurse Practitioners) who all work together to provide you with the care you need, when you need it.  We recommend signing up for the patient portal called "MyChart".  Sign up information is provided on this After Visit Summary.  MyChart is used to connect with patients for Virtual Visits (Telemedicine).  Patients are able to view lab/test results, encounter notes, upcoming appointments, etc.  Non-urgent messages can be sent to your provider as well.   To learn more about what you can do with MyChart, go to [email or web address].    Your next appointment:   July 2023 with Dr. Oval Linsey - we will reach out to you to schedule  Other Instructions  If your blood pressure is consistently <110/60 or if you have lightheadedness or dizziness please let us know. Your BP today via manual cuff 118/72   your BP today via your automatic cuff 128/94   If you are having increasing episodes of chest pain please let me know.  Heart Healthy Diet Recommendations: A low-salt diet is recommended. Meats should be grilled, baked, or boiled. Avoid fried foods. Focus on lean protein sources like fish or chicken with vegetables and fruits. The American Heart Association is a Microbiologist!  American Heart Association Diet and Lifeystyle Recommendations    Exercise recommendations: The American Heart Association recommends 150 minutes of moderate intensity exercise weekly. Try 30 minutes of moderate intensity exercise 4-5 times per week. This could include walking, jogging, or swimming.

## 2021-11-11 ENCOUNTER — Encounter (HOSPITAL_BASED_OUTPATIENT_CLINIC_OR_DEPARTMENT_OTHER): Payer: Self-pay

## 2021-11-25 NOTE — Telephone Encounter (Signed)
Lab Work as requested.  ?

## 2022-01-11 ENCOUNTER — Emergency Department (HOSPITAL_BASED_OUTPATIENT_CLINIC_OR_DEPARTMENT_OTHER)
Admission: EM | Admit: 2022-01-11 | Discharge: 2022-01-11 | Disposition: A | Discharge status: Home / Self Care | Payer: BC Managed Care – PPO | Attending: Emergency Medicine | Admitting: Emergency Medicine

## 2022-01-11 ENCOUNTER — Emergency Department (HOSPITAL_BASED_OUTPATIENT_CLINIC_OR_DEPARTMENT_OTHER): Payer: BC Managed Care – PPO | Admitting: Radiology

## 2022-01-11 ENCOUNTER — Emergency Department (HOSPITAL_BASED_OUTPATIENT_CLINIC_OR_DEPARTMENT_OTHER): Payer: BC Managed Care – PPO

## 2022-01-11 ENCOUNTER — Other Ambulatory Visit: Payer: Self-pay

## 2022-01-11 ENCOUNTER — Encounter (HOSPITAL_BASED_OUTPATIENT_CLINIC_OR_DEPARTMENT_OTHER): Payer: Self-pay

## 2022-01-11 DIAGNOSIS — I2510 Atherosclerotic heart disease of native coronary artery without angina pectoris: Secondary | ICD-10-CM | POA: Diagnosis not present

## 2022-01-11 DIAGNOSIS — Z79899 Other long term (current) drug therapy: Secondary | ICD-10-CM | POA: Diagnosis not present

## 2022-01-11 DIAGNOSIS — Z7982 Long term (current) use of aspirin: Secondary | ICD-10-CM | POA: Diagnosis not present

## 2022-01-11 DIAGNOSIS — R350 Frequency of micturition: Secondary | ICD-10-CM | POA: Insufficient documentation

## 2022-01-11 DIAGNOSIS — R300 Dysuria: Secondary | ICD-10-CM | POA: Diagnosis present

## 2022-01-11 DIAGNOSIS — I10 Essential (primary) hypertension: Secondary | ICD-10-CM | POA: Diagnosis not present

## 2022-01-11 DIAGNOSIS — R319 Hematuria, unspecified: Secondary | ICD-10-CM | POA: Insufficient documentation

## 2022-01-11 DIAGNOSIS — R1030 Lower abdominal pain, unspecified: Secondary | ICD-10-CM | POA: Insufficient documentation

## 2022-01-11 LAB — BASIC METABOLIC PANEL
Anion gap: 7 (ref 5–15)
BUN: 11 mg/dL (ref 6–20)
CO2: 27 mmol/L (ref 22–32)
Calcium: 9.9 mg/dL (ref 8.9–10.3)
Chloride: 100 mmol/L (ref 98–111)
Creatinine, Ser: 0.98 mg/dL (ref 0.61–1.24)
GFR, Estimated: >60 mL/min (ref >60)
Glucose, Bld: 108 mg/dL — ABNORMAL HIGH (ref 70–99)
Potassium: 3.7 mmol/L (ref 3.5–5.1)
Sodium: 134 mmol/L — ABNORMAL LOW (ref 135–145)

## 2022-01-11 LAB — URINALYSIS, ROUTINE W REFLEX MICROSCOPIC
Bilirubin Urine: NEGATIVE
Glucose, UA: NEGATIVE mg/dL
Ketones, ur: NEGATIVE mg/dL
Nitrite: NEGATIVE
Protein, ur: NEGATIVE mg/dL
Specific Gravity, Urine: 1.012 (ref 1.005–1.030)
pH: 5.5 (ref 5.0–8.0)

## 2022-01-11 LAB — CBC
HCT: 44.9 % (ref 39.0–52.0)
Hemoglobin: 14.5 g/dL (ref 13.0–17.0)
MCH: 28 pg (ref 26.0–34.0)
MCHC: 32.3 g/dL (ref 30.0–36.0)
MCV: 86.8 fL (ref 80.0–100.0)
Platelets: 320 10*3/uL (ref 150–400)
RBC: 5.17 MIL/uL (ref 4.22–5.81)
RDW: 12.8 % (ref 11.5–15.5)
WBC: 16.8 10*3/uL — ABNORMAL HIGH (ref 4.0–10.5)
nRBC: 0 % (ref 0.0–0.2)

## 2022-01-11 MED ORDER — IOHEXOL 300 MG/ML  SOLN: 100.0000 mL | Freq: Once | INTRAMUSCULAR | Status: DC | PRN

## 2022-01-11 NOTE — ED Notes (Signed)
D/c paperwork reviewed with pt. No questions or concerns at time of d/c. Ambulatory to ED exit, NAD.

## 2022-01-11 NOTE — ED Triage Notes (Signed)
Patient here POV from Home.  Bilateral Inguinal Hernia Repair in April. Endorses Urinary Frequency since then but more recently the Patient has been experiencing Dysuria, Blood in Urine.  1530 today a Fever of 101.5. Mild Nausea. No Emesis or Diarrhea.  NAD Noted during Triage. A&Ox4. GCS 15. Ambulatory.

## 2022-01-11 NOTE — Discharge Instructions (Addendum)
You were seen today for issues with urination. Your CT shows thickening of the bladder wall.  I recommend follow up with urology for further evaluation and management.

## 2022-01-11 NOTE — ED Provider Notes (Signed)
[facility] EMERGENCY DEPT Provider Note   CSN: [id number] Arrival date & time: 01/11/22  1631     History  Chief Complaint  Patient presents with   Dysuria    Sean Mckay is a 56 y.o. male. Patient presents to the hospital complaining of hematuria, dysuria, and urinary frequency.  Patient did have a fever earlier today of 101.5 but is currently afebrile.  Patient endorses mild nausea with no emesis or diarrhea.  Patient states that he began to have symptoms near the beginning of April after having bilateral inguinal hernia surgery.  Patient states that his surgeon at that time thought it was irritation of the bladder but patient states that it has not subsided.  Over the past week the patient has noticed a few episodes of hematuria.  Today he also had lower abdominal pain.  Denies chest pain, shortness of breath, headache.  Past medical history significant for hyperlipidemia, hypertriglyceridemia, palpitations, CAD, hypertension, adrenal adenoma, pure hypercholesterolemia HPI     Home Medications Prior to Admission medications   Medication Sig Start Date End Date Taking? Authorizing Provider  aspirin EC 81 MG tablet Take 1 tablet (81 mg total) by mouth daily. Swallow whole. 01/24/21   Skeet Latch, MD  Barberry-Oreg Grape-Goldenseal (BERBERINE COMPLEX PO) Take 1,000 mg by mouth daily.    [provider]  Cholecalciferol (VITAMIN D3) 250 MCG (10000 UT) capsule Take 10,000 Units by mouth daily.    [provider]  Coenzyme Q10 (COQ10) 100 MG CAPS Take 100 mg by mouth daily.    [provider]  Cyanocobalamin (VITAMIN B-12 PO) Take 2,500 mcg by mouth daily.    [provider]  DHEA 25 MG CAPS Take 25 mg by mouth daily.    [provider]  Astrid Drafts 500 MG CAPS Take by mouth. 1 tablet in the morning and 2 tablets in the evening    [provider]  loperamide (IMODIUM A-D) 2 MG tablet Take 2 mg by mouth 2 (two)  times daily.    [provider]  MAGNESIUM PO Take 900 mg by mouth daily.    [provider]  NALTREXONE HCL PO Take 1.5 mg by mouth daily.    [provider]  nebivolol (BYSTOLIC) 10 MG tablet Take 1 tablet (10 mg total) by mouth daily. 04/29/21   Skeet Latch, MD  niacin 500 MG tablet Take 500 mg by mouth at bedtime.    [provider]  nitroGLYCERIN (NITROSTAT) 0.4 MG SL tablet Place 1 tablet (0.4 mg total) under the tongue every 5 (five) minutes as needed for chest pain. 08/14/21 11/12/21  Loel Dubonnet, NP  NP THYROID 15 MG tablet Take 15 mg by mouth every morning. 04/17/21   [provider]  NP THYROID 60 MG tablet Take 60 mg by mouth daily. 04/17/21   [provider]  PANTETHINE PO Take 450 mg by mouth daily.    [provider]  Probiotic Product (PROBIOTIC DAILY PO) Take 2 capsules by mouth daily.    [provider]  rosuvastatin (CRESTOR) 5 MG tablet Take 1 tablet (5 mg total) by mouth daily. TAKE 1 TABLET BY MOUTH Monday, Wednesday, AND Friday ONLY 08/14/21   Loel Dubonnet, NP  sildenafil (VIAGRA) 50 MG tablet Take 25 mg by mouth daily as needed for erectile dysfunction.    [provider]  tadalafil (CIALIS) 10 MG tablet Take 1 tablet (10 mg total) by mouth daily as needed for erectile  dysfunction. 02/12/21   Loel Dubonnet, NP  UNABLE TO FIND Take 400 mg by mouth daily. Med Name: Kelle Darting    [provider]  valsartan (DIOVAN) 80 MG tablet Take 1 tablet (80 mg total) by mouth daily. 08/14/21   Loel Dubonnet, NP  vitamin E 180 MG (400 UNITS) capsule Take 400 Units by mouth daily.    [provider]  zinc gluconate 50 MG tablet Take 50 mg by mouth daily.    [provider]      Allergies    Hydralazine, Lisinopril, Norvasc [amlodipine besylate], Prednisone, Promethazine hcl, Statins, Ezetimibe-simvastatin, and Promethazine    Review of Systems   Review of Systems   Constitutional:  Positive for fever.  Respiratory:  Negative for shortness of breath.   Cardiovascular:  Negative for chest pain and palpitations.  Gastrointestinal:  Positive for abdominal pain and nausea. Negative for constipation, diarrhea and vomiting.  Genitourinary:  Positive for dysuria, frequency and hematuria.    Physical Exam Updated Vital Signs BP 129/83 (BP Location: Right Arm)   Pulse 83   Temp 98.2 F (36.8 C) (Oral)   Resp 20   Ht 6' (1.829 m)   Wt 84.4 kg   SpO2 97%   BMI 25.24 kg/m  Physical Exam Vitals and nursing note reviewed.  Constitutional:      General: He is not in acute distress.    Appearance: He is normal weight.  HENT:     Head: Normocephalic and atraumatic.     Mouth/Throat:     Mouth: Mucous membranes are moist.  Eyes:     Conjunctiva/sclera: Conjunctivae normal.  Cardiovascular:     Rate and Rhythm: Normal rate and regular rhythm.     Pulses: Normal pulses.     Heart sounds: Normal heart sounds.  Pulmonary:     Effort: Pulmonary effort is normal.     Breath sounds: Normal breath sounds.  Abdominal:     Palpations: Abdomen is soft.     Tenderness: There is abdominal tenderness (Tenderness across lower abdomen). There is no right CVA tenderness or left CVA tenderness.  Musculoskeletal:        General: Normal range of motion.     Cervical back: Normal range of motion and neck supple.     Right lower leg: No edema.     Left lower leg: No edema.  Skin:    General: Skin is warm and dry.     Capillary Refill: Capillary refill takes less than 2 seconds.  Neurological:     Mental Status: He is alert and oriented to person, place, and time.     ED Results / Procedures / Treatments   Labs (all labs ordered are listed, but only abnormal results are displayed) Labs Reviewed  URINALYSIS, ROUTINE W REFLEX MICROSCOPIC - Abnormal; Notable for the following components:      Result Value   Hgb urine dipstick TRACE (*)    Leukocytes,Ua TRACE  (*)    All other components within normal limits  BASIC METABOLIC PANEL - Abnormal; Notable for the following components:   Sodium 134 (*)    Glucose, Bld 108 (*)    All other components within normal limits  CBC - Abnormal; Notable for the following components:   WBC 16.8 (*)    All other components within normal limits  URINE CULTURE    EKG None  Radiology CT Abdomen Pelvis Wo Contrast  Result Date: 01/11/2022 CLINICAL DATA:  Abdominal pain,  acute, nonlocalized EXAM: CT ABDOMEN AND PELVIS WITHOUT CONTRAST TECHNIQUE: Multidetector CT imaging of the abdomen and pelvis was performed following the standard protocol without IV contrast. RADIATION DOSE REDUCTION: This exam was performed according to the departmental dose-optimization program which includes automated exposure control, adjustment of the mA and/or kV according to patient size and/or use of iterative reconstruction technique. COMPARISON:  CT abdomen pelvis 04/27/2020 FINDINGS: Lower chest: Bilateral lower lobe linear atelectasis versus scarring. No acute abnormality. Hepatobiliary: No focal liver abnormality. Layering density within the gallbladder lumen may represent tiny gallstones versus gallbladder sludge. No gallbladder wall thickening or pericholecystic fluid. No biliary dilatation. Pancreas: No focal lesion. Normal pancreatic contour. No surrounding inflammatory changes. No main pancreatic ductal dilatation. Spleen: Normal in size without focal abnormality. Adrenals/Urinary Tract: No adrenal nodule bilaterally. Punctate left nephrolithiasis (5:39). No right nephrolithiasis. No hydronephrosis. No definite contour-deforming renal mass. No ureterolithiasis or hydroureter. The urinary bladder is decompressed. Interval worsening of perivascular fat stranding and urinary bladder wall thickening. Stomach/Bowel: Stomach is within normal limits. No evidence of bowel wall thickening or dilatation. The appendix is not definitely identified with  no inflammatory changes in the right lower quadrant to suggest acute appendicitis. Vascular/Lymphatic: No abdominal aorta or iliac aneurysm. Mild atherosclerotic plaque of the aorta and its branches. No abdominal, pelvic, or inguinal lymphadenopathy. Reproductive: Unremarkable prostate. Other: No intraperitoneal free fluid. No intraperitoneal free gas. No organized fluid collection. Musculoskeletal: No abdominal wall hernia or abnormality. No suspicious lytic or blastic osseous lesions. No acute displaced fracture. Intervertebral disc space narrowing and posterior disc osteophyte complex formation at the L5-S1 level. IMPRESSION: 1. Interval worsening of urinary bladder wall thickening and perivesicular fat stranding that may be related to infection/inflammation versus chronic outlet obstruction. Recommend correlation with urinalysis to exclude infection. 2. Punctate nonobstructive left nephrolithiasis. 3. Layering density within the gallbladder lumen may represent tiny gallstones versus gallbladder sludge. 4.  Aortic Atherosclerosis (ICD10-I70.0). Electronically Signed   By: Iven Finn M.D.   On: 01/11/2022 21:08   DG Chest 2 View  Result Date: 01/11/2022 CLINICAL DATA:  Fever and dysuria. EXAM: CHEST - 2 VIEW COMPARISON:  March 23, 2018 FINDINGS: The heart size and mediastinal contours are within normal limits. Very mild, stable scarring and/or atelectasis is seen within the left lung base. There is no evidence of acute infiltrate, pleural effusion or pneumothorax. The visualized skeletal structures are unremarkable. IMPRESSION: No active cardiopulmonary disease. Electronically Signed   By: Virgina Norfolk M.D.   On: 01/11/2022 20:29    Procedures Procedures    Medications Ordered in ED Medications  iohexol (OMNIPAQUE) 300 MG/ML solution 100 mL (has no administration in time range)    ED Course/ Medical Decision Making/ A&P                           Medical Decision Making Amount and/or  Complexity of Data Reviewed Labs: ordered. Radiology: ordered.   Patient presents with a chief concern of hematuria, dysuria, abdominal pain.  Differential includes but not limited to cystitis, pyelonephritis, nephrolithiasis, appendicitis, bladder malignancy, and others  Patient chart reviewed including notes documenting hernia repair.  I ordered and reviewed lab work.  Pertinent results include sodium 134, WBC 16.8, trace hemoglobin and trace leukocytes on urinalysis  I ordered and personally interpreted imaging including chest x-ray and CT abdomen pelvis 1. Interval worsening of urinary bladder wall thickening and perivesicular fat stranding that may be related to infection/inflammation versus chronic outlet obstruction.  Recommend correlation with urinalysis to exclude infection. 2. Punctate nonobstructive left nephrolithiasis. 3. Layering density within the gallbladder lumen may represent tiny gallstones versus gallbladder sludge. 4. Aortic Atherosclerosis (ICD10-I70.0).  I agree with the radiologist's findings  Based on lab work and imaging no signs of cystitis, pyelonephritis, nephrolithiasis.  Tenderness is not significant in the right lower quadrant, no sign of appendicitis on CT.  CT suggestive of inflammatory changes in the body.  Plan to have patient follow-up with urology outpatient.  Bladder scan ordered showing residual of less than 90 mL of urine.  Discussed plan with patient who agrees to go follow-up with urology.  If the patient becomes unable to urinate patient will return to the emergency department for possible Foley placement        Final Clinical Impression(s) / ED Diagnoses Final diagnoses:  Dysuria  Hematuria, unspecified type    Rx / DC Orders ED Discharge Orders     None         Ronny Bacon 01/11/22 2152    Lacretia Leigh, MD 01/12/22 [id number]

## 2022-01-11 NOTE — ED Notes (Signed)
Pt transported to CT ?

## 2022-01-13 LAB — URINE CULTURE: Culture: <10000 — AB

## 2022-01-20 ENCOUNTER — Ambulatory Visit (INDEPENDENT_AMBULATORY_CARE_PROVIDER_SITE_OTHER): Payer: BC Managed Care – PPO | Admitting: Family

## 2022-01-20 ENCOUNTER — Encounter (HOSPITAL_BASED_OUTPATIENT_CLINIC_OR_DEPARTMENT_OTHER): Payer: Self-pay | Admitting: Family

## 2022-01-20 ENCOUNTER — Telehealth (HOSPITAL_BASED_OUTPATIENT_CLINIC_OR_DEPARTMENT_OTHER): Payer: Self-pay

## 2022-01-20 VITALS — BP 140/84 | HR 65 | Ht 72.0 in | Wt 182.0 lb

## 2022-01-20 DIAGNOSIS — E785 Hyperlipidemia, unspecified: Secondary | ICD-10-CM | POA: Diagnosis not present

## 2022-01-20 DIAGNOSIS — I700 Atherosclerosis of aorta: Secondary | ICD-10-CM | POA: Diagnosis not present

## 2022-01-20 DIAGNOSIS — I25118 Atherosclerotic heart disease of native coronary artery with other forms of angina pectoris: Secondary | ICD-10-CM | POA: Diagnosis not present

## 2022-01-20 DIAGNOSIS — R002 Palpitations: Secondary | ICD-10-CM | POA: Diagnosis not present

## 2022-01-20 DIAGNOSIS — I10 Essential (primary) hypertension: Secondary | ICD-10-CM

## 2022-01-20 MED ORDER — REPATHA 140 MG/ML ~~LOC~~ SOSY: 1.0000 | PREFILLED_SYRINGE | SUBCUTANEOUS | 6 refills | Status: DC

## 2022-01-20 NOTE — Patient Instructions (Signed)
Medication Instructions:   Your physician has recommended you make the following change in your medication:   START Repatha '140mg'$  every 2 weeks  Please do not pick up until we have ensured it is cost effective and prior authorization approved.   *If you need a refill on your cardiac medications before your next appointment, please call your pharmacy*   Lab Work:   We will coordinate repeat labs if we are able to start [place].   If you have labs (blood work) drawn today and your tests are completely normal, you will receive your results only by: [employer] (if you have MyChart) OR A paper copy in the mail If you have any lab test that is abnormal or we need to change your treatment, we will call you to review the results.   Testing/Procedures: None ordered today.   Follow-Up: At [facility] [place], you and your health needs are our priority.  As part of our continuing mission to provide you with exceptional heart care, we have created designated Provider Care Teams.  These Care Teams include your primary Cardiologist (physician) and Advanced Practice Providers (APPs -  Physician Assistants and Nurse Practitioners) who all work together to provide you with the care you need, when you need it.  We recommend signing up for the patient portal called "MyChart".  Sign up information is provided on this After Visit Summary.  MyChart is used to connect with patients for Virtual Visits (Telemedicine).  Patients are able to view lab/test results, encounter notes, upcoming appointments, etc.  Non-urgent messages can be sent to your provider as well.   To learn more about what you can do with MyChart, go to [email or web address].    Your next appointment:   6 month(s)  The format for your next appointment:   In Person  Provider:   Skeet Latch, MD or Loel Dubonnet, NP     Other Instructions Heart Healthy Diet Recommendations: A low-salt diet is recommended. Meats should be  grilled, baked, or boiled. Avoid fried foods. Focus on lean protein sources like fish or chicken with vegetables and fruits. The American Heart Association is a Microbiologist!  American Heart Association Diet and Lifeystyle Recommendations   Exercise recommendations: The American Heart Association recommends 150 minutes of moderate intensity exercise weekly. Try 30 minutes of moderate intensity exercise 4-5 times per week. This could include walking, jogging, or swimming.

## 2022-01-20 NOTE — Telephone Encounter (Signed)
REPATHA PRIOR AUTH STARTED;    INFORMATION ENTERED AND RECEIVED THE FOLLOWING MESSAGE   "There is a problem finding your patient. Please confirm the patient name, DOB, gender, and zip code was correctly entered, and click Check Eligibility to try again. If you continue to have trouble, contact [employer] at [phone]" RN called this number as well no answer.    CALLED [phone] FOUND ON BACK OF INSURANCE CARD- no answer

## 2022-01-20 NOTE — Progress Notes (Signed)
Office Visit    Patient Name: Sean Mckay Date of Encounter: 01/20/2022  PCP:  Hayden Rasmussen, MD   [place]  Cardiologist:  Skeet Latch, MD  Advanced Practice Provider:  No care team member to display Electrophysiologist:  None   Chief Complaint    Sean Mckay is a 56 y.o. male with a hx of HTN, HLD, palpitations, CAD, aortic atherosclerosis presents today for CAD follow up.   Past Medical History    Past Medical History:  Diagnosis Date   Adrenal adenoma 05/09/2016   CAD in native artery 04/30/2021   Essential hypertension 05/09/2016   Hyperlipidemia    Hypertriglyceridemia 05/09/2016   Palpitations 12/06/2020   Pure hypercholesterolemia 12/06/2020   Past Surgical History:  Procedure Laterality Date   HERNIA REPAIR  02/22/09   [facility]    Allergies  Allergies  Allergen Reactions   Hydralazine Other (See Comments)    Fatigue and muscle/joint pain Other reaction(s): Arthralgias (intolerance) Fatigue, myalgias   Lisinopril Other (See Comments)    Dizziness    Norvasc [Amlodipine Besylate]     dizziness   Prednisone Other (See Comments)    "vision problems" Other reaction(s): Other (See Comments), Other (See Comments) Blurred vision Visual problem when taken orally    Promethazine Hcl    Statins Other (See Comments)    aches Other reaction(s): Myalgias (intolerance)   Ezetimibe-Simvastatin Rash   Promethazine Nausea Only    Other reaction(s): Other (See Comments) Unknown reaction     History of Present Illness    Sean Mckay is a 56 y.o. male with a hx of HTN, HLD, palpitations, CAD, aortic atherosclerosis  last seen 08/14/21.  He has established with Dr. Oval Linsey due to poorly controlled hypotension and multiple medication intolerances. Previous renal artery dopplers with no evidence of renal artery stenosis. Previous carotid dopplers due to syncope with no obstruction. Nuclear stress test negative for  ischemia. Previous holter monitor years ago with PAC's and PVCs. Cardiac CTA 2017 with coronary calcium score of 0 and pulmonary artery mildly dilated at 3.1x2.7 cm. Sleep study 2017 no evidence of OSA. 48-hour holter with PAC and PVC with no significant arrhythmia. Echo 04/13/18 normal LVEF, trivial TR.   Previous intolerance to HCTZ, increased doses of Nebivolol, Zetia, Amlodipine (syncope), Lisinopril (dizziness), Nifedipine (fatigue, SOB).  EKG [facility] May 2022 with lightheadedness, palpitations, presyncope, shortness of breath. Lexiscan myoview was poor quality study. Subsequent cardiac CTA with stable small hiatal hernia, coronary calcium score of 24.9 plaicng him in 65th percentile, moderate (50-69%) mixed plaque stenosis in prox RCA - overall CAD-RADS 3 moderate stenosis. He contacted the office noting palpitations and wore ZIO for 6 days. Average heart rate 68bpm. Predominantly NSR. PAC and PVC with <1% burden, no significant arrhythmia. He had 55 triggered events during wear time which were predominantly associated with NSR or PVCs.   Last seen 08/2021 doing overall well from a cardiac perspective.  He was encouraged to trial increasing his rosuvastatin from 3 days/week to 5 days/week.  April 2023 he had bilateral inguinal hernia surgery.  Presents today for follow-up with his husband.  He is having some dysuria and had follow-up with urology earlier this morning.  Notes when he increased the rosuvastatin he felt more myalgias but tolerates 3 times per week without significant myalgias.  We reviewed recommendation for LDL less than 70.  11/2021 LDL 122.  He had 1 dose of Repatha with primary care but no further  and is agreeable to trial again.  Reports significant chest discomfort nor dyspnea.  BP overall well controlled at home.  Occasional spikes without clear cause as high as SBP 160s but as low as 100s.    EKGs/Labs/Other Studies Reviewed:   The following studies were reviewed today:  Cardiac  CTA 01/08/21 FINDINGS: Image quality: Good   Coronary Arteries:  Normal coronary origin.  Right dominance.   Left main: The left main is a large caliber vessel with a normal take off from the left coronary cusp that trifurcates into a LAD, LCX, and ramus intermedius. There is no plaque or stenosis.   Left anterior descending artery: The LAD has minimal (0-24%) soft plaque in the proximal vessel.   Ramus intermedius: Branching vessel; patent with no evidence of plaque or stenosis.   Left circumflex artery: The LCX is non-dominant and patent with no evidence of plaque or stenosis. The LCX gives off 2 large patent obtuse marginal branches.   Right coronary artery: The RCA is dominant with normal take off from the right coronary cusp. There is moderate (50-69%) mixed plaque stenosis in the proximal vessel. The RCA terminates as a small PDA and right posterolateral branch.   Right Atrium: Right atrial size is within normal limits.   Right Ventricle: The right ventricular cavity is within normal limits.   Left Atrium: Left atrial size is normal in size with no left atrial appendage filling defect.   Left Ventricle: The ventricular cavity size is within normal limits. There are no stigmata of prior infarction. There is no abnormal filling defect.   Pulmonary veins: Normal pulmonary venous drainage.   Pericardium: Normal thickness with no significant effusion or calcium present.   Cardiac valves: The aortic valve is trileaflet without significant calcification. The mitral valve is normal structure without significant calcification.   Aorta: Normal caliber with no significant disease.   Extra-cardiac findings: See attached radiology report for non-cardiac structures.   IMPRESSION: 1. Coronary calcium score of 24.9. This was 16 percentile for age-, sex, and race-matched controls.   2. Normal coronary origin with right dominance.   3. Moderate (50-69%) mixed plaque  stenosis in the proximal RCA.   4. Study will be sent for FFR.   RECOMMENDATIONS: CAD-RADS 3: Moderate stenosis. Consider symptom-guided anti-ischemic pharmacotherapy as well as risk factor modification per guideline directed care. Additional analysis with CT FFR will be submitted.   FINDINGS: Image quality: Good   Coronary Arteries:  Normal coronary origin.  Right dominance.   Left main: The left main is a large caliber vessel with a normal take off from the left coronary cusp that trifurcates into a LAD, LCX, and ramus intermedius. There is no plaque or stenosis.   Left anterior descending artery: The LAD has minimal (0-24%) soft plaque in the proximal vessel.   Ramus intermedius: Branching vessel; patent with no evidence of plaque or stenosis.   Left circumflex artery: The LCX is non-dominant and patent with no evidence of plaque or stenosis. The LCX gives off 2 large patent obtuse marginal branches.   Right coronary artery: The RCA is dominant with normal take off from the right coronary cusp. There is moderate (50-69%) mixed plaque stenosis in the proximal vessel. The RCA terminates as a small PDA and right posterolateral branch.   Right Atrium: Right atrial size is within normal limits.   Right Ventricle: The right ventricular cavity is within normal limits.   Left Atrium: Left atrial size is normal in size with  no left atrial appendage filling defect.   Left Ventricle: The ventricular cavity size is within normal limits. There are no stigmata of prior infarction. There is no abnormal filling defect.   Pulmonary veins: Normal pulmonary venous drainage.   Pericardium: Normal thickness with no significant effusion or calcium present.   Cardiac valves: The aortic valve is trileaflet without significant calcification. The mitral valve is normal structure without significant calcification.   Aorta: Normal caliber with no significant disease.   Extra-cardiac  findings: See attached radiology report for non-cardiac structures.   IMPRESSION: 1. Coronary calcium score of 24.9. This was 24 percentile for age-, sex, and race-matched controls.   2. Normal coronary origin with right dominance.   3. Moderate (50-69%) mixed plaque stenosis in the proximal RCA.   4. Study will be sent for FFR.   RECOMMENDATIONS: CAD-RADS 3: Moderate stenosis. Consider symptom-guided anti-ischemic pharmacotherapy as well as risk factor modification per guideline directed care. Additional analysis with CT FFR will be submitted.    1. Left Main: findings 0.99, 0.99 0.99   2. LAD: findings 0.97, 0.91 0.86   3. LCX: findings 0.99, 0.85 0.86; OM2: 0.84, 0.80, 0.77   4. Ramus: findings 0.98, 0.97 0.95   5. RCA: findings 0.98, 0.87 0.86   IMPRESSION: FFR abnormal in very distal OM2 but otherwise normal; medical therapy likely best option.  Lexiscan Myoview 12/03/15: LVEF 61%.  No ischemia.   Carotid artery Doppler 12/05/15: 1-39% ICA stenosis bilaterally   Echo 12/03/15: Normal LV size and function.  Mild MR, trace TR.   Echo 04/13/18: LVEF 55-60%.  Trivial TR.   48 Hour Event Monitor 04/13/18:   Quality: Fair.  Baseline artifact. Predominant rhythm: sinus rhythm Average heart rate: 67 bpm Max heart rate: 120 bpm Min heart rate: 47 bpm   Rare PVCs and occasional PACs 3 beats of NSVT   Patient reported episodes of chest pain and jaw pain, at which time sinus rhythm was noted.  EKG:  No EKG today   Recent Labs: 04/15/2021: ALT 24 01/11/2022: BUN 11; Creatinine, Ser 0.98; Hemoglobin 14.5; Platelets 320; Potassium 3.7; Sodium 134  Recent Lipid Panel    Component Value Date/Time   CHOL 237 (H) 04/15/2021 0920   TRIG 351 (H) 04/15/2021 0920   HDL 44 04/15/2021 0920   CHOLHDL 5.4 (H) 04/15/2021 0920   LDLCALC 130 (H) 04/15/2021 0920    Home Medications   Current Meds  Medication Sig   aspirin EC 81 MG tablet Take 1 tablet (81 mg total) by mouth  daily. Swallow whole.   Cholecalciferol (VITAMIN D3) 250 MCG (10000 UT) capsule Take 10,000 Units by mouth daily.   Krill Oil 500 MG CAPS Take by mouth. 1 tablet in the morning and 2 tablets in the evening   loperamide (IMODIUM A-D) 2 MG tablet Take 2 mg by mouth 2 (two) times daily.   nebivolol (BYSTOLIC) 10 MG tablet Take 1 tablet (10 mg total) by mouth daily.   NP THYROID 90 MG tablet Take 90 mg by mouth daily.   Probiotic Product (PROBIOTIC DAILY PO) Take 2 capsules by mouth daily.   rosuvastatin (CRESTOR) 5 MG tablet Take 1 tablet (5 mg total) by mouth daily. TAKE 1 TABLET BY MOUTH Monday, Wednesday, AND Friday ONLY   sildenafil (VIAGRA) 50 MG tablet Take 25 mg by mouth daily as needed for erectile dysfunction.   tadalafil (CIALIS) 10 MG tablet Take 1 tablet (10 mg total) by mouth daily as needed for erectile  dysfunction.   valsartan (DIOVAN) 80 MG tablet Take 1 tablet (80 mg total) by mouth daily.   vitamin E 180 MG (400 UNITS) capsule Take 400 Units by mouth daily.     Review of Systems   All other systems reviewed and are otherwise negative except as noted above.  Physical Exam    VS:  BP 140/84   Pulse 65   Ht 6' (1.829 m)   Wt 182 lb (82.6 kg)   BMI 24.68 kg/m  , BMI Body mass index is 24.68 kg/m.  Wt Readings from Last 3 Encounters:  01/20/22 182 lb (82.6 kg)  01/11/22 186 lb 1.1 oz (84.4 kg)  08/14/21 186 lb (84.4 kg)    GEN: Well nourished, well developed, in no acute distress. HEENT: normal. Neck: Supple, no JVD, carotid bruits, or masses. Cardiac: RRR, no murmurs, rubs, or gallops. No clubbing, cyanosis, edema.  Radials/PT 2+ and equal bilaterally.  Respiratory:  Respirations regular and unlabored, clear to auscultation bilaterally. GI: Soft, nontender, nondistended. MS: No deformity or atrophy. Skin: Warm and dry, no rash. Neuro:  Strength and sensation are intact. Psych: Normal affect.  Assessment & Plan    Palpitations - ZIO monitor with predominantly  NSR, average heart rate 68bpm, <1% PAC and PVC burden. Triggered episodes associated with NSR and PVC/PAC.  Continue Bystolic '10mg'$  QD. Recommend stay well hydrated, avoid caffeine, manage stress well.   CAD / Aortic atherosclerosis - Cardiac CTA 01/08/21 with moderate RCA stenosis. GDMT includes Nebivolol, Rosuvastatin, Aspirin, PRN nitroglycerin. Heart healthy diet and regular cardiovascular exercise encouraged.  Add Repatha as detailed below.   HTN - BP mildly elevated in clinic though noted most often well controlled at home. Home BP cuff previously noted to be 10 points higher than manual reading. Still with occasional BP spike 160s but also readings as low as 109. We will continue his current antihypertensive regimen and he will contact us if he has persistently elevated BP.   HLD, LDL goal <70 -  12/07/20 LDL 174. 11/2021 LDL 122. Tolerates rosuvastatin 5 mg 3 times per week.  Did not tolerate increased dose.  Rx Repatha '140mg'$  every 14 days. Provided copay card in clinic. Will submit prior authorization. He will wait to pickup until prior auth obtained and we ensure cost effective. Plan to have repeat lipid panel/LFTs after 6 doses  ED - Continue to follow with PCP, urology.   Disposition: Follow up in 6 months with Dr. Oval Linsey or APP.  Signed, Loel Dubonnet, NP 01/20/2022, 11:31 AM [place]

## 2022-01-20 NOTE — Telephone Encounter (Signed)
Spoke with CoverMyMeds Chat support member who stated [facility] [employer] Prior Authorizations is currently down. They are working to fix the issue. Advised to wait an hour and try again.

## 2022-01-23 NOTE — Telephone Encounter (Signed)
Per cover my meds Repatha prior auth approved.   RN updated patient

## 2022-01-23 NOTE — Telephone Encounter (Signed)
Repatha prior auth completed, Key: [id number]  Awaiting determination

## 2022-02-10 ENCOUNTER — Other Ambulatory Visit (HOSPITAL_BASED_OUTPATIENT_CLINIC_OR_DEPARTMENT_OTHER): Payer: Self-pay | Admitting: Family

## 2022-02-10 DIAGNOSIS — N529 Male erectile dysfunction, unspecified: Secondary | ICD-10-CM

## 2022-02-10 MED ORDER — TADALAFIL 10 MG PO TABS: 10.0000 mg | ORAL_TABLET | Freq: Every day | ORAL | 1 refills | Status: DC | PRN

## 2022-03-21 ENCOUNTER — Encounter (HOSPITAL_BASED_OUTPATIENT_CLINIC_OR_DEPARTMENT_OTHER): Payer: Self-pay

## 2022-03-21 LAB — COMPREHENSIVE METABOLIC PANEL
ALT: 13 IU/L (ref 0–44)
AST: 15 IU/L (ref 0–40)
Albumin/Globulin Ratio: 1.5 (ref 1.2–2.2)
Albumin: 4.4 g/dL (ref 3.8–4.9)
Alkaline Phosphatase: 50 IU/L (ref 44–121)
BUN/Creatinine Ratio: 11 (ref 9–20)
BUN: 10 mg/dL (ref 6–24)
Bilirubin Total: 0.8 mg/dL (ref 0.0–1.2)
CO2: 24 mmol/L (ref 20–29)
Calcium: 9.5 mg/dL (ref 8.7–10.2)
Chloride: 101 mmol/L (ref 96–106)
Creatinine, Ser: 0.88 mg/dL (ref 0.76–1.27)
Globulin, Total: 2.9 g/dL (ref 1.5–4.5)
Glucose: 96 mg/dL (ref 70–99)
Potassium: 4.6 mmol/L (ref 3.5–5.2)
Sodium: 140 mmol/L (ref 134–144)
Total Protein: 7.3 g/dL (ref 6.0–8.5)
eGFR: 101 mL/min/{1.73_m2} (ref >59)

## 2022-03-21 LAB — LIPID PANEL
Chol/HDL Ratio: 4.4 ratio (ref 0.0–5.0)
Cholesterol, Total: 197 mg/dL (ref 100–199)
HDL: 45 mg/dL (ref >39)
LDL Chol Calc (NIH): 113 mg/dL — ABNORMAL HIGH (ref 0–99)
Triglycerides: 227 mg/dL — ABNORMAL HIGH (ref 0–149)
VLDL Cholesterol Cal: 39 mg/dL (ref 5–40)

## 2022-03-21 NOTE — Telephone Encounter (Signed)
Opened in error

## 2022-03-22 IMAGING — CR DG KNEE COMPLETE 4+V*L*
4 series · 4 of 4 positions shown · non-contrast
Comparison: None

CLINICAL DATA: LEFT KNEE INJURY. Fell in manhole 04/12/2021, medial
left knee pain and swelling

EXAM:
LEFT KNEE - COMPLETE 4+ VIEW

[w knee ap left]
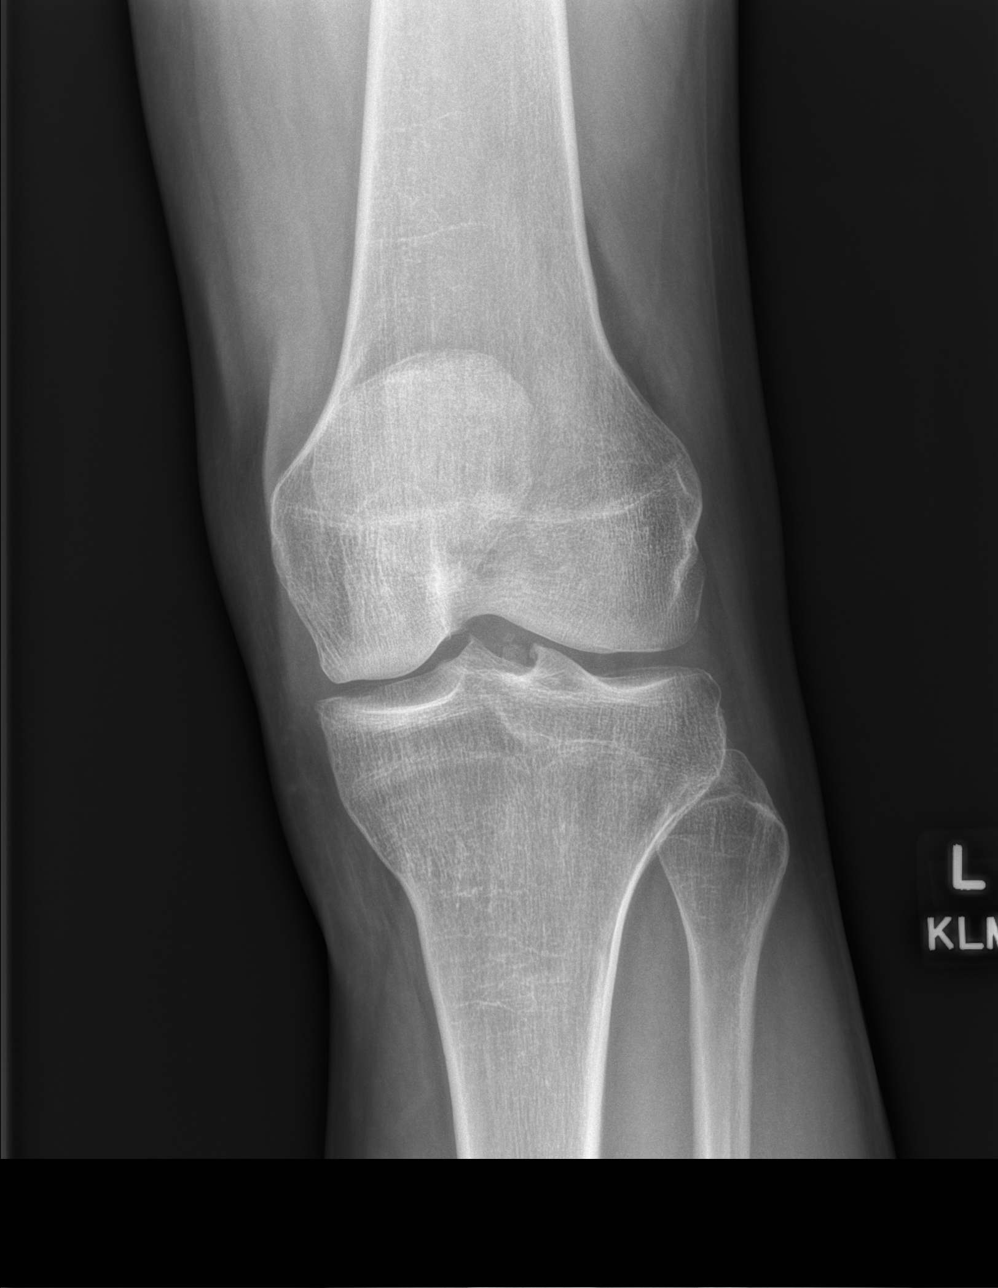

[w knee lat left]
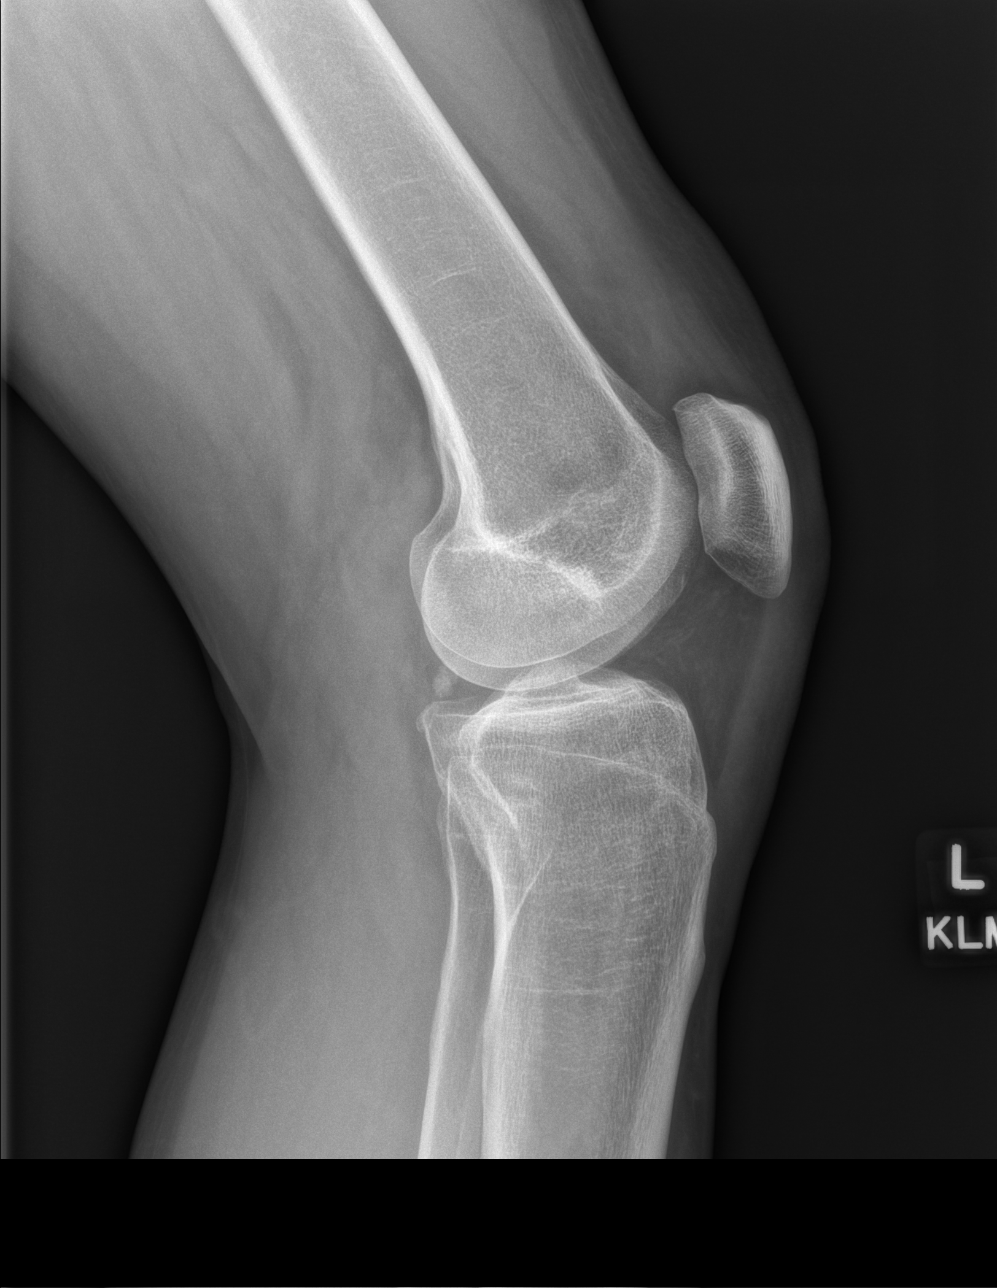

[w knee tunnel pa left]
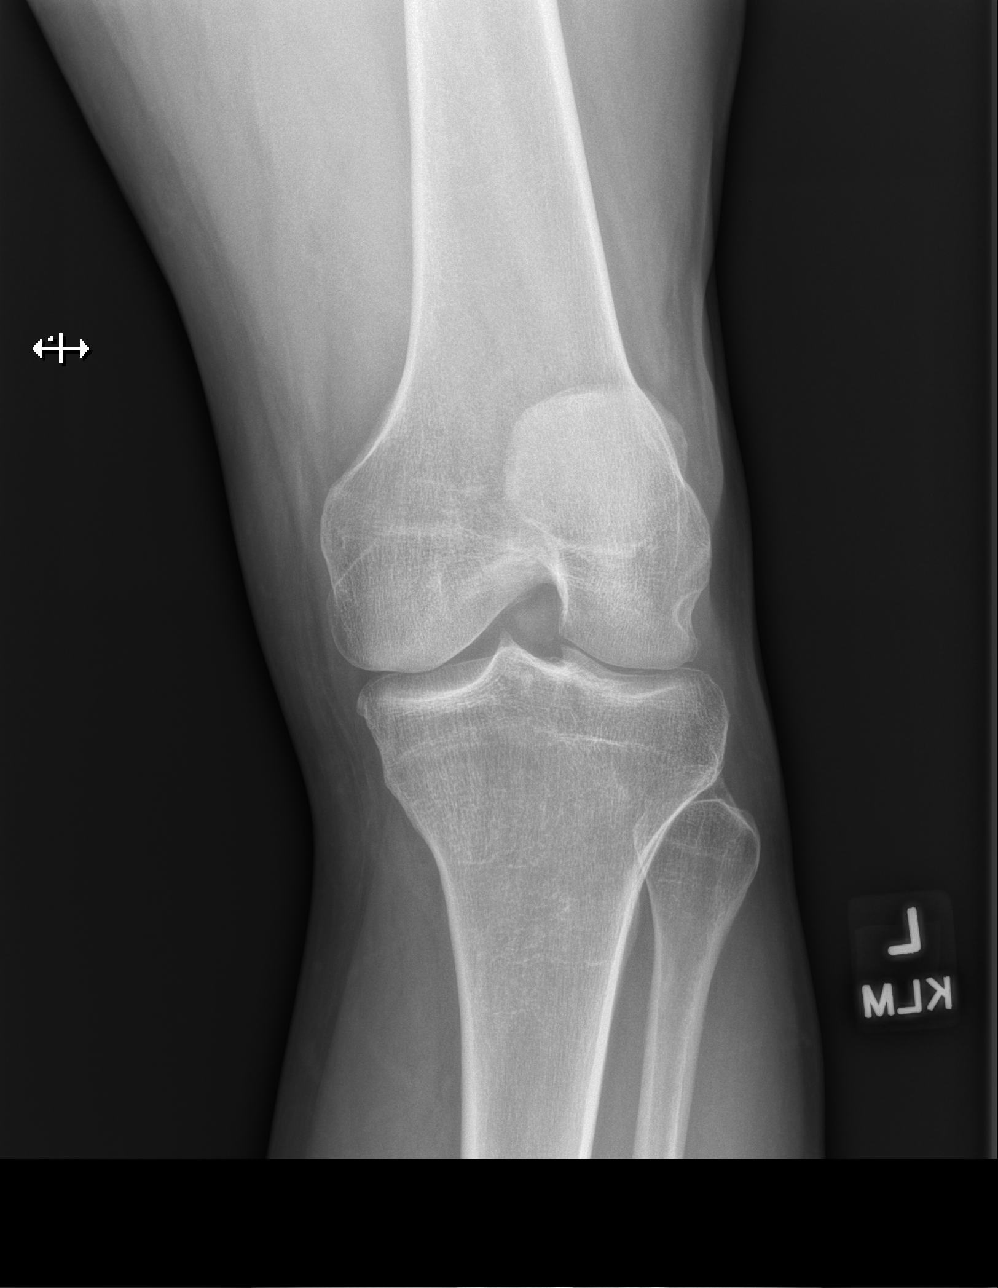

[x knee sunrise left]
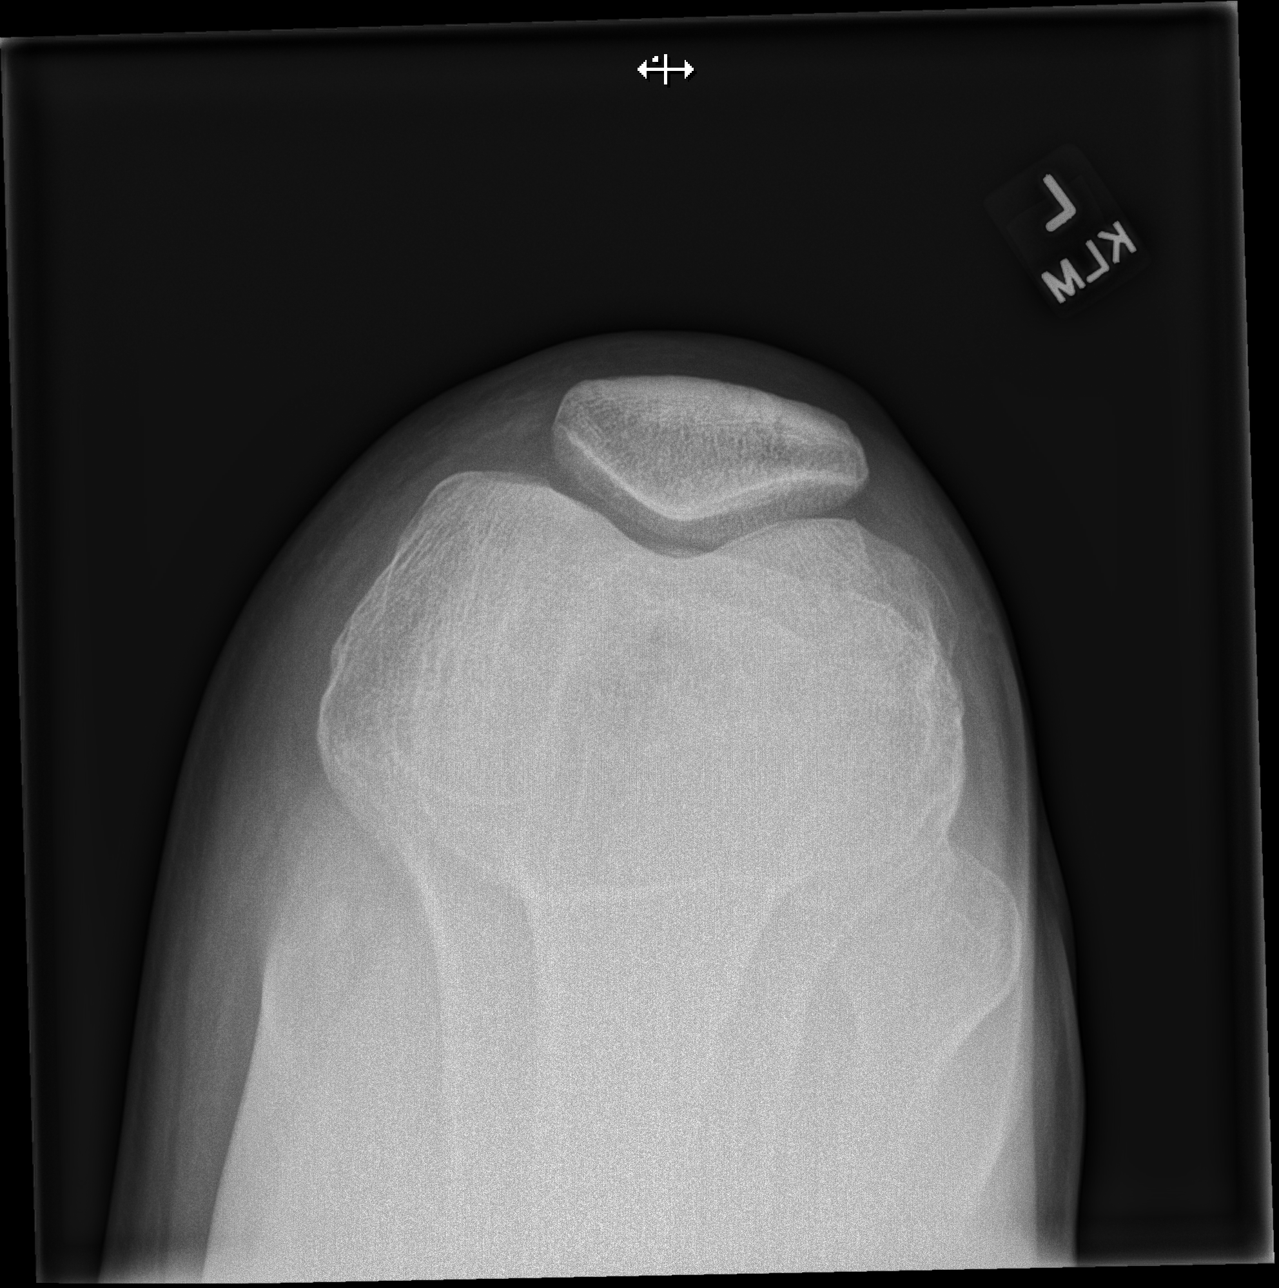

[4 of 4 positions shown; findings below may reference images not displayed]

FINDINGS: Mild overall varus angulation. No acute fracture or dislocation.
Rounded 8 mm calcific density within the posterior joint space may
represent a intra-articular loose body. Trace left knee effusion.
Soft tissues are otherwise unremarkable.
IMPRESSION: No acute fracture or dislocation.

Possible 8 mm intra-articular loose body. Trace left knee effusion.
This could be confirmed with MRI examination if indicated.

## 2022-03-25 ENCOUNTER — Encounter (HOSPITAL_BASED_OUTPATIENT_CLINIC_OR_DEPARTMENT_OTHER): Payer: Self-pay

## 2022-03-25 DIAGNOSIS — I10 Essential (primary) hypertension: Secondary | ICD-10-CM

## 2022-03-25 MED ORDER — VALSARTAN 80 MG PO TABS: 80.0000 mg | ORAL_TABLET | Freq: Every day | ORAL | 3 refills | Status: DC

## 2022-04-10 ENCOUNTER — Encounter (HOSPITAL_BASED_OUTPATIENT_CLINIC_OR_DEPARTMENT_OTHER): Payer: Self-pay

## 2022-04-10 NOTE — Telephone Encounter (Signed)
Want me to bring him in?

## 2022-04-15 ENCOUNTER — Encounter (HOSPITAL_BASED_OUTPATIENT_CLINIC_OR_DEPARTMENT_OTHER): Payer: Self-pay | Admitting: Family

## 2022-04-15 ENCOUNTER — Encounter (HOSPITAL_BASED_OUTPATIENT_CLINIC_OR_DEPARTMENT_OTHER): Payer: Self-pay | Admitting: Cardiovascular Disease

## 2022-04-15 ENCOUNTER — Ambulatory Visit (INDEPENDENT_AMBULATORY_CARE_PROVIDER_SITE_OTHER): Payer: BC Managed Care – PPO | Admitting: Family

## 2022-04-15 VITALS — BP 122/74 | HR 60 | Ht 72.0 in | Wt 179.4 lb

## 2022-04-15 DIAGNOSIS — I10 Essential (primary) hypertension: Secondary | ICD-10-CM | POA: Diagnosis not present

## 2022-04-15 DIAGNOSIS — E785 Hyperlipidemia, unspecified: Secondary | ICD-10-CM

## 2022-04-15 DIAGNOSIS — I25118 Atherosclerotic heart disease of native coronary artery with other forms of angina pectoris: Secondary | ICD-10-CM

## 2022-04-15 DIAGNOSIS — R002 Palpitations: Secondary | ICD-10-CM

## 2022-04-15 DIAGNOSIS — I700 Atherosclerosis of aorta: Secondary | ICD-10-CM

## 2022-04-15 MED ORDER — NITROGLYCERIN 0.4 MG SL SUBL: 0.4000 mg | SUBLINGUAL_TABLET | SUBLINGUAL | 3 refills | Status: DC | PRN

## 2022-04-15 MED ORDER — NEBIVOLOL HCL 10 MG PO TABS: 10.0000 mg | ORAL_TABLET | Freq: Every day | ORAL | 3 refills | Status: DC

## 2022-04-15 NOTE — Patient Instructions (Signed)
Medication Instructions:  Your Physician recommend you continue on your current medication as directed.    *If you need a refill on your cardiac medications before your next appointment, please call your pharmacy*  Testing/Procedures: Your Physician has requested you have a Myocardial Perfusion Imaging Study.   Please arrive 15 minutes prior to your appointment time for registration and insurance purposes.   The test will take approximately 3 to 4 hours to complete; you may bring reading material. If someone comes with you to your appointment, they will need to remain in the main lobby due to limited testing space in the testing area. **If you are pregnant or breastfeeding, please notify the nuclear lab prior to your appointment**   How to prepare for your test:  - Do not eat or drink 3 hours prior to your test, except you may have water  - Do not consume products containing caffeine ( regular or decaf) 12 hours prior to your test ( coffee, chocolate, sodas or teas)  - Do bring a list of your current medications with you. If not listed below, you may take your medications as normal (Hold beta blocker- 24 hour prior for exercise myoview)   - Do wear comfortable clothes (no dresses or overalls) and walking shoes ( tennis shoes preferred), no heel or open toe shoes are allowed - Do not wear cologne, perfume, aftershave, or lotions ( deodorant is allowed)  - If these instructions are not followed, your test will be rescheduled   If you cannot keep your appointment, please provide 24 hours notice to the Nuclear Lab, to avoid a possible $50 charge to your account!    Follow-Up: At [facility], you and your health needs are our priority.  As part of our continuing mission to provide you with exceptional heart care, we have created designated Provider Care Teams.  These Care Teams include your primary Cardiologist (physician) and Advanced Practice Providers (APPs -  Physician Assistants and  Nurse Practitioners) who all work together to provide you with the care you need, when you need it.  We recommend signing up for the patient portal called "MyChart".  Sign up information is provided on this After Visit Summary.  MyChart is used to connect with patients for Virtual Visits (Telemedicine).  Patients are able to view lab/test results, encounter notes, upcoming appointments, etc.  Non-urgent messages can be sent to your provider as well.   To learn more about what you can do with MyChart, go to [email or web address].    Your next appointment:   Follow up in 3 months with Dr. Oval Linsey or Laurann Montana, NP    Other Instructions Heart Healthy Diet Recommendations: A low-salt diet is recommended. Meats should be grilled, baked, or boiled. Avoid fried foods. Focus on lean protein sources like fish or chicken with vegetables and fruits. The American Heart Association is a Microbiologist!  American Heart Association Diet and Lifeystyle Recommendations   Exercise recommendations: The American Heart Association recommends 150 minutes of moderate intensity exercise weekly. Try 30 minutes of moderate intensity exercise 4-5 times per week. This could include walking, jogging, or swimming.   Important Information About Sugar

## 2022-04-15 NOTE — Progress Notes (Signed)
Office Visit    Patient Name: Sean Mckay Date of Encounter: 04/15/2022  PCP:  Hayden Rasmussen, MD   [place]  Cardiologist:  Skeet Latch, MD  Advanced Practice Provider:  No care team member to display Electrophysiologist:  None   Chief Complaint    Sean Mckay is a 56 y.o. male with a hx of HTN, HLD, palpitations, CAD, aortic atherosclerosis presents today for chest pain  Past Medical History    Past Medical History:  Diagnosis Date   Adrenal adenoma 05/09/2016   CAD in native artery 04/30/2021   Essential hypertension 05/09/2016   Hyperlipidemia    Hypertriglyceridemia 05/09/2016   Palpitations 12/06/2020   Pure hypercholesterolemia 12/06/2020   Past Surgical History:  Procedure Laterality Date   HERNIA REPAIR  02/22/09   RIH    Allergies  Allergies  Allergen Reactions   Hydralazine Other (See Comments)    Fatigue and muscle/joint pain Other reaction(s): Arthralgias (intolerance) Fatigue, myalgias   Lisinopril Other (See Comments)    Dizziness    Norvasc [Amlodipine Besylate]     dizziness   Prednisone Other (See Comments)    "vision problems" Other reaction(s): Other (See Comments), Other (See Comments) Blurred vision Visual problem when taken orally    Promethazine Hcl    Statins Other (See Comments)    aches Other reaction(s): Myalgias (intolerance)   Ezetimibe-Simvastatin Rash   Promethazine Nausea Only    Other reaction(s): Other (See Comments) Unknown reaction     History of Present Illness    Sean TRELA is a 56 y.o. male with a hx of HTN, HLD, palpitations, CAD, aortic atherosclerosis  last seen 01/20/22  He has established with Dr. Oval Linsey due to poorly controlled hypotension and multiple medication intolerances. Previous renal artery dopplers with no evidence of renal artery stenosis. Previous carotid dopplers due to syncope with no obstruction. Nuclear stress test negative for  ischemia. Previous holter monitor years ago with PAC's and PVCs. Cardiac CTA 2017 with coronary calcium score of 0 and pulmonary artery mildly dilated at 3.1x2.7 cm. Sleep study 2017 no evidence of OSA. 48-hour holter with PAC and PVC with no significant arrhythmia. Echo 04/13/18 normal LVEF, trivial TR.   Previous intolerance to HCTZ, increased doses of Nebivolol, Zetia, Amlodipine (syncope), Lisinopril (dizziness), Nifedipine (fatigue, SOB).  May 2022 presented with lightheadedness, palpitations, presyncope, shortness of breath. Lexiscan myoview was poor quality study. Subsequent cardiac CTA with stable small hiatal hernia, coronary calcium score of 24.9 plaicng him in 65th percentile, moderate (50-69%) mixed plaque stenosis in prox RCA - overall CAD-RADS 3 moderate stenosis. He contacted the office noting palpitations and wore ZIO for 6 days. Average heart rate 68bpm. Predominantly NSR. PAC and PVC with <1% burden, no significant arrhythmia. He had 55 triggered events during wear time which were predominantly associated with NSR or PVCs.   Seen 08/2021 doing overall well from a cardiac perspective.  He was encouraged to trial increasing his rosuvastatin from 3 days/week to 5 days/week.  April 2023 he had bilateral inguinal hernia surgery.  He was last seen 01/20/22 with his husband doing well. Noted increased myalgias with increased Rosuvastatin. He was started on Repatha. 11/2021 LDL 122 and repeat 03/20/22 after Repatha LDL 113.   Presents today for follow-up independently. He had a recent trip to [place] and got to see the [employer] with his husband. 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   He  notes episodes of chest discomfort at rest and with activity. This has occurred five times over the past week. He has not felt like he needed to take a nitroglycerin for his pain. No noted aggravating nor relieving factors. Lasting 5-10 minutes or up to 30 minutes. Describes as  midsternal pressure (5-10 minutes) then will also having "stinging" sensation through his left side of chest to back of shoulder. When he develops  "sharp pain" it is usually associated with near syncope which are very rare. Breathing has been overall good. No orthopnea, PND, edema.   He states he has not started his Repatha due to his PCP noting it can cause his A1C to increase. He notes he is a prediabetic with last A1C to be 6.1. He is not against injections just worried about controlling his blood sugars.   EKGs/Labs/Other Studies Reviewed:   The following studies were reviewed today:  Cardiac CTA 01/08/21 FINDINGS: Image quality: Good   Coronary Arteries:  Normal coronary origin.  Right dominance.   Left main: The left main is a large caliber vessel with a normal take off from the left coronary cusp that trifurcates into a LAD, LCX, and ramus intermedius. There is no plaque or stenosis.   Left anterior descending artery: The LAD has minimal (0-24%) soft plaque in the proximal vessel.   Ramus intermedius: Branching vessel; patent with no evidence of plaque or stenosis.   Left circumflex artery: The LCX is non-dominant and patent with no evidence of plaque or stenosis. The LCX gives off 2 large patent obtuse marginal branches.   Right coronary artery: The RCA is dominant with normal take off from the right coronary cusp. There is moderate (50-69%) mixed plaque stenosis in the proximal vessel. The RCA terminates as a small PDA and right posterolateral branch.   Right Atrium: Right atrial size is within normal limits.   Right Ventricle: The right ventricular cavity is within normal limits.   Left Atrium: Left atrial size is normal in size with no left atrial appendage filling defect.   Left Ventricle: The ventricular cavity size is within normal limits. There are no stigmata of prior infarction. There is no abnormal filling defect.   Pulmonary veins: Normal pulmonary  venous drainage.   Pericardium: Normal thickness with no significant effusion or calcium present.   Cardiac valves: The aortic valve is trileaflet without significant calcification. The mitral valve is normal structure without significant calcification.   Aorta: Normal caliber with no significant disease.   Extra-cardiac findings: See attached radiology report for non-cardiac structures.   IMPRESSION: 1. Coronary calcium score of 24.9. This was 57 percentile for age-, sex, and race-matched controls.   2. Normal coronary origin with right dominance.   3. Moderate (50-69%) mixed plaque stenosis in the proximal RCA.   4. Study will be sent for FFR.   RECOMMENDATIONS: CAD-RADS 3: Moderate stenosis. Consider symptom-guided anti-ischemic pharmacotherapy as well as risk factor modification per guideline directed care. Additional analysis with CT FFR will be submitted.   FINDINGS: Image quality: Good   Coronary Arteries:  Normal coronary origin.  Right dominance.   Left main: The left main is a large caliber vessel with a normal take off from the left coronary cusp that trifurcates into a LAD, LCX, and ramus intermedius. There is no plaque or stenosis.   Left anterior descending artery: The LAD has minimal (0-24%) soft plaque in the proximal vessel.   Ramus intermedius: Branching vessel; patent with no evidence of plaque or  stenosis.   Left circumflex artery: The LCX is non-dominant and patent with no evidence of plaque or stenosis. The LCX gives off 2 large patent obtuse marginal branches.   Right coronary artery: The RCA is dominant with normal take off from the right coronary cusp. There is moderate (50-69%) mixed plaque stenosis in the proximal vessel. The RCA terminates as a small PDA and right posterolateral branch.   Right Atrium: Right atrial size is within normal limits.   Right Ventricle: The right ventricular cavity is within normal limits.   Left Atrium:  Left atrial size is normal in size with no left atrial appendage filling defect.   Left Ventricle: The ventricular cavity size is within normal limits. There are no stigmata of prior infarction. There is no abnormal filling defect.   Pulmonary veins: Normal pulmonary venous drainage.   Pericardium: Normal thickness with no significant effusion or calcium present.   Cardiac valves: The aortic valve is trileaflet without significant calcification. The mitral valve is normal structure without significant calcification.   Aorta: Normal caliber with no significant disease.   Extra-cardiac findings: See attached radiology report for non-cardiac structures.   IMPRESSION: 1. Coronary calcium score of 24.9. This was 56 percentile for age-, sex, and race-matched controls.   2. Normal coronary origin with right dominance.   3. Moderate (50-69%) mixed plaque stenosis in the proximal RCA.   4. Study will be sent for FFR.   RECOMMENDATIONS: CAD-RADS 3: Moderate stenosis. Consider symptom-guided anti-ischemic pharmacotherapy as well as risk factor modification per guideline directed care. Additional analysis with CT FFR will be submitted.    1. Left Main: findings 0.99, 0.99 0.99   2. LAD: findings 0.97, 0.91 0.86   3. LCX: findings 0.99, 0.85 0.86; OM2: 0.84, 0.80, 0.77   4. Ramus: findings 0.98, 0.97 0.95   5. RCA: findings 0.98, 0.87 0.86   IMPRESSION: FFR abnormal in very distal OM2 but otherwise normal; medical therapy likely best option.  Lexiscan Myoview 12/03/15: LVEF 61%.  No ischemia.   Carotid artery Doppler 12/05/15: 1-39% ICA stenosis bilaterally   Echo 12/03/15: Normal LV size and function.  Mild MR, trace TR.   Echo 04/13/18: LVEF 55-60%.  Trivial TR.   48 Hour Event Monitor 04/13/18:   Quality: Fair.  Baseline artifact. Predominant rhythm: sinus rhythm Average heart rate: 67 bpm Max heart rate: 120 bpm Min heart rate: 47 bpm   Rare PVCs and  occasional PACs 3 beats of NSVT   Patient reported episodes of chest pain and jaw pain, at which time sinus rhythm was noted.  EKG: EKG today shows NSR rate 60bpm with no ST/T wave changes.   Recent Labs: 01/11/2022: Hemoglobin 14.5; Platelets 320 03/20/2022: ALT 13; BUN 10; Creatinine, Ser 0.88; Potassium 4.6; Sodium 140  Recent Lipid Panel    Component Value Date/Time   CHOL 197 03/20/2022 0949   TRIG 227 (H) 03/20/2022 0949   HDL 45 03/20/2022 0949   CHOLHDL 4.4 03/20/2022 0949   LDLCALC 113 (H) 03/20/2022 0949    Home Medications   Current Meds  Medication Sig   aspirin EC 81 MG tablet Take 1 tablet (81 mg total) by mouth daily. Swallow whole.   Cholecalciferol (VITAMIN D3) 250 MCG (10000 UT) capsule Take 5,000 Units by mouth daily.   Krill Oil 500 MG CAPS Take by mouth. 1 tablet in the morning and 2 tablets in the evening   loperamide (IMODIUM A-D) 2 MG tablet Take 2 mg by mouth 2 (  two) times daily.   NP THYROID 90 MG tablet Take 90 mg by mouth daily.   Probiotic Product (PROBIOTIC DAILY PO) Take 2 capsules by mouth daily.   rosuvastatin (CRESTOR) 5 MG tablet Take 1 tablet (5 mg total) by mouth daily. TAKE 1 TABLET BY MOUTH Monday, Wednesday, AND Friday ONLY   sildenafil (VIAGRA) 50 MG tablet Take 25 mg by mouth daily as needed for erectile dysfunction.   tadalafil (CIALIS) 10 MG tablet Take 1 tablet (10 mg total) by mouth daily as needed for erectile dysfunction.   valsartan (DIOVAN) 80 MG tablet Take 1 tablet (80 mg total) by mouth daily.   vitamin E 180 MG (400 UNITS) capsule Take 400 Units by mouth daily.   [DISCONTINUED] nebivolol (BYSTOLIC) 10 MG tablet Take 1 tablet (10 mg total) by mouth daily.     Review of Systems   All other systems reviewed and are otherwise negative except as noted above.  Physical Exam    VS:  BP 122/74   Pulse 60   Ht 6' (1.829 m)   Wt 179 lb 6.4 oz (81.4 kg)   BMI 24.33 kg/m  , BMI Body mass index is 24.33 kg/m.  Wt Readings from  Last 3 Encounters:  04/15/22 179 lb 6.4 oz (81.4 kg)  01/20/22 182 lb (82.6 kg)  01/11/22 186 lb 1.1 oz (84.4 kg)    GEN: Well nourished, well developed, in no acute distress. HEENT: normal. Neck: Supple, no JVD, carotid bruits, or masses. Cardiac: RRR, no murmurs, rubs, or gallops. No clubbing, cyanosis, edema.  Radials/PT 2+ and equal bilaterally.  Respiratory:  Respirations regular and unlabored, clear to auscultation bilaterally. GI: Soft, nontender, nondistended. MS: No deformity or atrophy. Skin: Warm and dry, no rash. Neuro:  Strength and sensation are intact. Psych: Normal affect.  Assessment & Plan    Chest Pressure / CAD / Aortic atherosclerosis:  Midsternal chest pressure that has been on-going for years. CP has been more common over last couple months. Lasting up to 30 minutes a time during rest and exertion with radiation to left shoulder blade and left arm. He has moderate (50-69%) RCA stenosis per CTA 6//2022. The plan is to perform [facility] for ischemic evaluation. Will not order Imdur due to Cialis and Viagra usage. PRN Nitroglycerin education and precautions given.  GDMT includes Nebivolol, Rosuvastatin, Aspirin, PRN nitroglycerin. Refer to [facility] [place] as pt has concerns causing diabetes (A1C 6.1). Education was provided regarding Repatha side effects.    Shared Decision Making/Informed Consent The risks [chest pain, shortness of breath, cardiac arrhythmias, dizziness, blood pressure fluctuations, myocardial infarction, stroke/transient ischemic attack, nausea, vomiting, allergic reaction, radiation exposure, metallic taste sensation and life-threatening complications (estimated to be 1 in 10,000)], benefits (risk stratification, diagnosing coronary artery disease, treatment guidance) and alternatives of a nuclear stress test were discussed in detail with Mr. Mcglocklin and he agrees to proceed.   Palpitations - Previous ZIO monitor with predominantly  NSR, average heart rate 68bpm, <1% PAC and PVC burden. Triggered episodes associated with NSR and PVC/PAC. Continue Bystolic '10mg'$  QD. Recommend stay well hydrated, avoid caffeine, manage stress well. He will work to reduce coca-cola drinks.   HTN - BP is at goal in clinic and well controlled at home. We will continue his current antihypertensive regimen and he will contact us if he has persistently elevated BP.   HLD, LDL goal <70 -  04/2021 LDL 130. 03/2022 LDL 113. Tolerates rosuvastatin 5 mg 3 times per  week.  Did not tolerate increased dose. Plans to start Repatha '140mg'$  every 14 days after clinic education. Previously provided Rx but did not fill due to concerns regarding it causing diabetes. Reviewed strong indication for [id number] due to cardiovascular risk with known CAD. Will refer to [facility] for education as noted above.   ED - Continue to follow with PCP, urology.   Disposition: Follow up in 3 months with Dr. Oval Linsey or APP.  Signed, Loel Dubonnet, NP 04/15/2022, 9:59 AM [place]

## 2022-04-18 ENCOUNTER — Encounter (HOSPITAL_BASED_OUTPATIENT_CLINIC_OR_DEPARTMENT_OTHER): Payer: Self-pay

## 2022-04-18 NOTE — Telephone Encounter (Signed)
FYI

## 2022-04-21 ENCOUNTER — Telehealth (HOSPITAL_COMMUNITY): Payer: Self-pay

## 2022-04-21 NOTE — Telephone Encounter (Signed)
Detailed instructions left on the patient's answering machine. Asked to call back with any questions. S.Wendolyn Raso EMTP 

## 2022-04-22 ENCOUNTER — Ambulatory Visit (HOSPITAL_COMMUNITY): Payer: BC Managed Care – PPO | Attending: Cardiology

## 2022-04-22 DIAGNOSIS — I25118 Atherosclerotic heart disease of native coronary artery with other forms of angina pectoris: Secondary | ICD-10-CM | POA: Diagnosis not present

## 2022-04-22 DIAGNOSIS — E785 Hyperlipidemia, unspecified: Secondary | ICD-10-CM | POA: Insufficient documentation

## 2022-04-22 DIAGNOSIS — I10 Essential (primary) hypertension: Secondary | ICD-10-CM | POA: Insufficient documentation

## 2022-04-22 LAB — MYOCARDIAL PERFUSION IMAGING
LV dias vol: 85 mL (ref 62–150)
LV sys vol: 37 mL
Nuc Stress EF: 57 %
Peak HR: 90 {beats}/min
Rest HR: 58 {beats}/min
Rest Nuclear Isotope Dose: 10.2 mCi
SDS: 0
SRS: 0
SSS: 0
Stress Nuclear Isotope Dose: 31.6 mCi
TID: 0.94

## 2022-04-22 MED ORDER — REGADENOSON 0.4 MG/5ML IV SOLN
0.4000 mg | Freq: Once | INTRAVENOUS | Status: AC
  Administered 2022-04-22: 0.4 mg via INTRAVENOUS

## 2022-04-22 MED ORDER — TECHNETIUM TC 99M TETROFOSMIN IV KIT
10.2000 | PACK | Freq: Once | INTRAVENOUS | Status: AC | PRN
  Administered 2022-04-22: 10.2 via INTRAVENOUS

## 2022-04-22 MED ORDER — TECHNETIUM TC 99M TETROFOSMIN IV KIT
31.6000 | PACK | Freq: Once | INTRAVENOUS | Status: AC | PRN
  Administered 2022-04-22: 31.6 via INTRAVENOUS

## 2022-04-24 ENCOUNTER — Encounter (HOSPITAL_BASED_OUTPATIENT_CLINIC_OR_DEPARTMENT_OTHER): Payer: Self-pay

## 2022-04-24 DIAGNOSIS — I25118 Atherosclerotic heart disease of native coronary artery with other forms of angina pectoris: Secondary | ICD-10-CM

## 2022-04-24 NOTE — Telephone Encounter (Signed)
Spoke with patient and has been having increased exertional discomfort/pressure, worse since stress test Chest pressure seems to be more consistent then what it was  Denies nausea  Was having some left arm pain prior to stress test Pain/pressure on left side of chest to under arm  Advised to avoid strenuous activity the remainder of the day, if pain returns and does not subside to call 911 to get evaluated at ED Does have shortness of breath with exertion, which he stated was not new just the pain/pressure  Will forward to [place] [facility]  NP for review

## 2022-04-25 MED ORDER — AMLODIPINE BESYLATE 2.5 MG PO TABS: 2.5000 mg | ORAL_TABLET | Freq: Every day | ORAL | 3 refills | Status: DC

## 2022-04-25 MED ORDER — NITROGLYCERIN 0.4 MG SL SUBL: 0.4000 mg | SUBLINGUAL_TABLET | SUBLINGUAL | 3 refills | Status: AC | PRN

## 2022-04-25 NOTE — Telephone Encounter (Signed)
Myoview 04/22/22 was low risk showing normal heart muscle function and no evidence of ischemia. Prior CT scan 12/2020 with nonobstructive coronary disease. Low suspicion the chest pain is cardiac related due to recent stress test which was normal.   START Amlodipine 2.'5mg'$  daily as it can  help if there is any microvascular dysfunction causing chest discomfort.  Ensure he has Nitroglycerin at home and takes as needed for chest discomfort. (May not take if took Sildenafil or Tadalafil in last 24 hours)  If chest pain is persistent despite PRN Nitroglycerin, needs to be evaluated in ED.  Can schedule OV for 2-3 weeks after starting Amlodipine to reassess symptoms.   Loel Dubonnet, NP

## 2022-04-28 NOTE — Progress Notes (Unsigned)
Office Visit    Patient Name: Sean Mckay Date of Encounter: 04/29/2022  PCP:  Hayden Rasmussen, MD   [place]  Cardiologist:  Skeet Latch, MD  Advanced Practice Provider:  Loel Dubonnet, NP Electrophysiologist:  None   Chief Complaint    Sean Mckay is a 56 y.o. male presents today for chest pain.  Past Medical History    Past Medical History:  Diagnosis Date   Adrenal adenoma 05/09/2016   CAD in native artery 04/30/2021   Essential hypertension 05/09/2016   Hyperlipidemia    Hypertriglyceridemia 05/09/2016   Palpitations 12/06/2020   Pure hypercholesterolemia 12/06/2020   Past Surgical History:  Procedure Laterality Date   HERNIA REPAIR  02/22/09   RIH    Allergies  Allergies  Allergen Reactions   Hydralazine Other (See Comments)    Fatigue and muscle/joint pain Other reaction(s): Arthralgias (intolerance) Fatigue, myalgias   Lisinopril Other (See Comments)    Dizziness    Norvasc [Amlodipine Besylate]     dizziness   Prednisone Other (See Comments)    "vision problems" Other reaction(s): Other (See Comments), Other (See Comments) Blurred vision Visual problem when taken orally    Promethazine Hcl    Statins Other (See Comments)    aches Other reaction(s): Myalgias (intolerance)   Ezetimibe-Simvastatin Rash   Promethazine Nausea Only    Other reaction(s): Other (See Comments) Unknown reaction     History of Present Illness    Sean Mckay is a 56 y.o. male with a hx of HTN, HLD, palpitations, CAD, aortic atherosclerosis  last seen 04/15/22.  He has established with Dr. Oval Linsey due to poorly controlled hypotension and multiple medication intolerances. Previous renal artery dopplers with no evidence of renal artery stenosis. Previous carotid dopplers due to syncope with no obstruction. Nuclear stress test negative for ischemia. Previous holter monitor years ago with PAC's and PVCs. Cardiac  CTA 2017 with coronary calcium score of 0 and pulmonary artery mildly dilated at 3.1x2.7 cm. Sleep study 2017 no evidence of OSA. 48-hour holter with PAC and PVC with no significant arrhythmia. Echo 04/13/18 normal LVEF, trivial TR.   Previous intolerance to HCTZ, increased doses of Nebivolol, Zetia, Amlodipine (syncope), Lisinopril (dizziness), Nifedipine (fatigue, SOB).  May 2022 presented with lightheadedness, palpitations, presyncope, shortness of breath. Lexiscan myoview was poor quality study. Subsequent cardiac CTA with stable small hiatal hernia, coronary calcium score of 24.9 plaicng him in 65th percentile, moderate (50-69%) mixed plaque stenosis in prox RCA - overall CAD-RADS 3 moderate stenosis. He contacted the office noting palpitations and wore ZIO for 6 days. Average heart rate 68bpm. Predominantly NSR. PAC and PVC with <1% burden, no significant arrhythmia. He had 55 triggered events during wear time which were predominantly associated with NSR or PVCs.   Seen 08/2021 doing overall well from a cardiac perspective.  April 2023 he had bilateral inguinal hernia surgery.Seen 01/20/22 with his husband doing well. Noted increased myalgias with increased Rosuvastatin. He was prescribed Repatha but never stopped it. 11/2021 LDL 122 and repeat 03/20/22 after Repatha LDL 113. Seen 04/15/22 noting chest discomfort with subsequent myoview 04/15/22 no ischemia.   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 He contacted the office noting worsening chest discomfort and started on Amlodipine for antianginal benefit. He took one dose and noted dizziness, chest discomfort at rest, headache.   Tells me he was hopeful he would respond well to Amlodipine  but felt dizzy, short of breath, chest pressure, flushing, headache which lasted about 24 hours. Is staring to feel better now. He is having chest discomfort on his left chest wall and in left upper chest. Feles like a  "burning" sensation with exertion associated with shortness of breath, lightheadedness, near syncope. Also describes as "throbbing" in his left chest. After rest the symptoms improve. No changes with eating or drinking. BP at home mostly 124/89 and one isolated reading SBP 105/82.  EKGs/Labs/Other Studies Reviewed:   The following studies were reviewed today: Myoview 04/22/22   The study is normal. The study is low risk.   The patient reported dyspnea, dizziness, flushing and chest pain (atypical) during the stress test. Normal blood pressure and normal heart rate response noted during stress. Heart rate recovery was normal.   Baseline EKG demonstrated diffuse J point elevated consistent with early repolarization abnormality   No change from baseline early repolarization changes during infusion.   Left ventricular function is normal. Nuclear stress EF: 57 %. The left ventricular ejection fraction is normal (55-65%). End diastolic cavity size is normal. End systolic cavity size is normal.   Prior study available for comparison from 12/12/2020.  Cardiac CTA 01/08/21 FINDINGS: Image quality: Good   Coronary Arteries:  Normal coronary origin.  Right dominance.   Left main: The left main is a large caliber vessel with a normal take off from the left coronary cusp that trifurcates into a LAD, LCX, and ramus intermedius. There is no plaque or stenosis.   Left anterior descending artery: The LAD has minimal (0-24%) soft plaque in the proximal vessel.   Ramus intermedius: Branching vessel; patent with no evidence of plaque or stenosis.   Left circumflex artery: The LCX is non-dominant and patent with no evidence of plaque or stenosis. The LCX gives off 2 large patent obtuse marginal branches.   Right coronary artery: The RCA is dominant with normal take off from the right coronary cusp. There is moderate (50-69%) mixed plaque stenosis in the proximal vessel. The RCA terminates as a small  PDA and right posterolateral branch.   Right Atrium: Right atrial size is within normal limits.   Right Ventricle: The right ventricular cavity is within normal limits.   Left Atrium: Left atrial size is normal in size with no left atrial appendage filling defect.   Left Ventricle: The ventricular cavity size is within normal limits. There are no stigmata of prior infarction. There is no abnormal filling defect.   Pulmonary veins: Normal pulmonary venous drainage.   Pericardium: Normal thickness with no significant effusion or calcium present.   Cardiac valves: The aortic valve is trileaflet without significant calcification. The mitral valve is normal structure without significant calcification.   Aorta: Normal caliber with no significant disease.   Extra-cardiac findings: See attached radiology report for non-cardiac structures.   IMPRESSION: 1. Coronary calcium score of 24.9. This was 75 percentile for age-, sex, and race-matched controls.   2. Normal coronary origin with right dominance.   3. Moderate (50-69%) mixed plaque stenosis in the proximal RCA.   4. Study will be sent for FFR.   RECOMMENDATIONS: CAD-RADS 3: Moderate stenosis. Consider symptom-guided anti-ischemic pharmacotherapy as well as risk factor modification per guideline directed care. Additional analysis with CT FFR will be submitted.   FINDINGS: Image quality: Good   Coronary Arteries:  Normal coronary origin.  Right dominance.   Left main: The left main is a large caliber vessel with a normal take off  from the left coronary cusp that trifurcates into a LAD, LCX, and ramus intermedius. There is no plaque or stenosis.   Left anterior descending artery: The LAD has minimal (0-24%) soft plaque in the proximal vessel.   Ramus intermedius: Branching vessel; patent with no evidence of plaque or stenosis.   Left circumflex artery: The LCX is non-dominant and patent with no evidence of plaque  or stenosis. The LCX gives off 2 large patent obtuse marginal branches.   Right coronary artery: The RCA is dominant with normal take off from the right coronary cusp. There is moderate (50-69%) mixed plaque stenosis in the proximal vessel. The RCA terminates as a small PDA and right posterolateral branch.   Right Atrium: Right atrial size is within normal limits.   Right Ventricle: The right ventricular cavity is within normal limits.   Left Atrium: Left atrial size is normal in size with no left atrial appendage filling defect.   Left Ventricle: The ventricular cavity size is within normal limits. There are no stigmata of prior infarction. There is no abnormal filling defect.   Pulmonary veins: Normal pulmonary venous drainage.   Pericardium: Normal thickness with no significant effusion or calcium present.   Cardiac valves: The aortic valve is trileaflet without significant calcification. The mitral valve is normal structure without significant calcification.   Aorta: Normal caliber with no significant disease.   Extra-cardiac findings: See attached radiology report for non-cardiac structures.   IMPRESSION: 1. Coronary calcium score of 24.9. This was 25 percentile for age-, sex, and race-matched controls.   2. Normal coronary origin with right dominance.   3. Moderate (50-69%) mixed plaque stenosis in the proximal RCA.   4. Study will be sent for FFR.   RECOMMENDATIONS: CAD-RADS 3: Moderate stenosis. Consider symptom-guided anti-ischemic pharmacotherapy as well as risk factor modification per guideline directed care. Additional analysis with CT FFR will be submitted.    1. Left Main: findings 0.99, 0.99 0.99   2. LAD: findings 0.97, 0.91 0.86   3. LCX: findings 0.99, 0.85 0.86; OM2: 0.84, 0.80, 0.77   4. Ramus: findings 0.98, 0.97 0.95   5. RCA: findings 0.98, 0.87 0.86   IMPRESSION: FFR abnormal in very distal OM2 but otherwise normal; medical therapy  likely best option.  Lexiscan Myoview 12/03/15: LVEF 61%.  No ischemia.   Carotid artery Doppler 12/05/15: 1-39% ICA stenosis bilaterally   Echo 12/03/15: Normal LV size and function.  Mild MR, trace TR.   Echo 04/13/18: LVEF 55-60%.  Trivial TR.   48 Hour Event Monitor 04/13/18:   Quality: Fair.  Baseline artifact. Predominant rhythm: sinus rhythm Average heart rate: 67 bpm Max heart rate: 120 bpm Min heart rate: 47 bpm   Rare PVCs and occasional PACs 3 beats of NSVT   Patient reported episodes of chest pain and jaw pain, at which time sinus rhythm was noted.  EKG: EKG independently reviewed from 04/15/22 with NSR rate 60bpm with no ST/T wave changes.   Recent Labs: 01/11/2022: Hemoglobin 14.5; Platelets 320 03/20/2022: ALT 13; BUN 10; Creatinine, Ser 0.88; Potassium 4.6; Sodium 140  Recent Lipid Panel    Component Value Date/Time   CHOL 197 03/20/2022 0949   TRIG 227 (H) 03/20/2022 0949   HDL 45 03/20/2022 0949   CHOLHDL 4.4 03/20/2022 0949   LDLCALC 113 (H) 03/20/2022 0949    Home Medications   Current Meds  Medication Sig   aspirin EC 81 MG tablet Take 1 tablet (81 mg total) by mouth daily.  Swallow whole.   Cholecalciferol (VITAMIN D3) 250 MCG (10000 UT) capsule Take 5,000 Units by mouth daily.   Krill Oil 500 MG CAPS Take by mouth. 1 tablet in the morning and 2 tablets in the evening   loperamide (IMODIUM A-D) 2 MG tablet Take 2 mg by mouth 2 (two) times daily.   nebivolol (BYSTOLIC) 10 MG tablet Take 1 tablet (10 mg total) by mouth daily.   nitroGLYCERIN (NITROSTAT) 0.4 MG SL tablet Place 1 tablet (0.4 mg total) under the tongue every 5 (five) minutes as needed for chest pain.   NP THYROID 90 MG tablet Take 90 mg by mouth daily.   Probiotic Product (PROBIOTIC DAILY PO) Take 2 capsules by mouth daily.   rosuvastatin (CRESTOR) 5 MG tablet Take 1 tablet (5 mg total) by mouth daily. TAKE 1 TABLET BY MOUTH Monday, Wednesday, AND Friday ONLY   sildenafil (VIAGRA) 50 MG  tablet Take 25 mg by mouth daily as needed for erectile dysfunction.   tadalafil (CIALIS) 10 MG tablet Take 1 tablet (10 mg total) by mouth daily as needed for erectile dysfunction.   valsartan (DIOVAN) 80 MG tablet Take 1 tablet (80 mg total) by mouth daily.   vitamin E 180 MG (400 UNITS) capsule Take 400 Units by mouth daily.     Review of Systems   All other systems reviewed and are otherwise negative except as noted above.  Physical Exam    VS:  BP 118/76   Pulse (!) 59   Ht 6' (1.829 m)   Wt 178 lb 14.4 oz (81.1 kg)   BMI 24.26 kg/m  , BMI Body mass index is 24.26 kg/m.  Wt Readings from Last 3 Encounters:  04/29/22 178 lb 14.4 oz (81.1 kg)  04/22/22 179 lb (81.2 kg)  04/15/22 179 lb 6.4 oz (81.4 kg)    GEN: Well nourished, well developed, in no acute distress. HEENT: normal. Neck: Supple, no JVD, carotid bruits, or masses. Cardiac: RRR, no murmurs, rubs, or gallops. No clubbing, cyanosis, edema.  Radials/PT 2+ and equal bilaterally.  Respiratory:  Respirations regular and unlabored, clear to auscultation bilaterally. GI: Soft, nontender, nondistended. MS: No deformity or atrophy. Skin: Warm and dry, no rash. Neuro:  Strength and sensation are intact. Psych: Normal affect.  Assessment & Plan    Chest Pressure / CAD / Aortic atherosclerosis:  He has moderate (50-69%) RCA stenosis per CTA 12/2020. Lexiscan myoview 04/22/22 with no ischemia. Unable to use Imdur due to cialis/sildenafil. Would defer use of Ranolazine due to lightheadedness/dizziness.Took one tablet of Amlodipine 2.5 and reported lightheadedness, headache.  GDMT includes Nebivolol, Rosuvastatin, Aspirin, PRN nitroglycerin. Still with chest discomfort that is left sided and exertional. Has not taken nitroglycerin. Discussed risk and benefits of Coronary Catheterization - he politely declines. Pt requests to proceed with  Cardiac CTA. ED precautions reviewed if CP worsens, plan to proceed with LHC. Discussed that  if symptoms worsen or if cardiac CTA with significant obstruction, would require LHC.   Palpitations - ZIO monitor 01/2021 with predominantly NSR, average heart rate 68bpm, <1% PAC and PVC burden. Triggered episodes associated with NSR and PVC/PAC. Continue Bystolic '10mg'$  QD. Recommend stay well hydrated, avoid caffeine, manage stress well. He denies palpitations in clinic today.   HTN - BP is at goal in clinic and well controlled at home. We will continue his current antihypertensive regimen and he will contact us if he has persistently elevated BP. If he develops any hypotension he will spilt his Valsartan  in half.   HLD, LDL goal <70 -  04/2021 LDL 130. 03/2022 LDL 113. Tolerates rosuvastatin 5 mg 3 times per week.  Did not tolerate increased dose. Has appointment with [facility] pharmacist to discuss possible addition of PCSK9 on 11/6.     ED - Continue to follow with PCP, urology.   Disposition: Follow up as scheduled with Dr. Oval Linsey or APP or sooner regarding testing results.  Signed, Loel Dubonnet, NP 04/29/2022, 9:59 AM [place]

## 2022-04-28 NOTE — H&P (View-Only) (Signed)
Office Visit    Patient Name: Sean Mckay Date of Encounter: 04/29/2022  PCP:  Hayden Rasmussen, MD   [place]  Cardiologist:  Skeet Latch, MD  Advanced Practice Provider:  Loel Dubonnet, NP Electrophysiologist:  None   Chief Complaint    Sean Mckay is a 56 y.o. male presents today for chest pain.  Past Medical History    Past Medical History:  Diagnosis Date   Adrenal adenoma 05/09/2016   CAD in native artery 04/30/2021   Essential hypertension 05/09/2016   Hyperlipidemia    Hypertriglyceridemia 05/09/2016   Palpitations 12/06/2020   Pure hypercholesterolemia 12/06/2020   Past Surgical History:  Procedure Laterality Date   HERNIA REPAIR  02/22/09   [facility]    Allergies  Allergies  Allergen Reactions   Hydralazine Other (See Comments)    Fatigue and muscle/joint pain Other reaction(s): Arthralgias (intolerance) Fatigue, myalgias   Lisinopril Other (See Comments)    Dizziness    Norvasc [Amlodipine Besylate]     dizziness   Prednisone Other (See Comments)    "vision problems" Other reaction(s): Other (See Comments), Other (See Comments) Blurred vision Visual problem when taken orally    Promethazine Hcl    Statins Other (See Comments)    aches Other reaction(s): Myalgias (intolerance)   Ezetimibe-Simvastatin Rash   Promethazine Nausea Only    Other reaction(s): Other (See Comments) Unknown reaction     History of Present Illness    Sean Mckay is a 56 y.o. male with a hx of HTN, HLD, palpitations, CAD, aortic atherosclerosis  last seen 04/15/22.  He has established with Dr. Oval Linsey due to poorly controlled hypotension and multiple medication intolerances. Previous renal artery dopplers with no evidence of renal artery stenosis. Previous carotid dopplers due to syncope with no obstruction. Nuclear stress test negative for ischemia. Previous holter monitor years ago with PAC's and PVCs. Cardiac  CTA 2017 with coronary calcium score of 0 and pulmonary artery mildly dilated at 3.1x2.7 cm. Sleep study 2017 no evidence of OSA. 48-hour holter with PAC and PVC with no significant arrhythmia. Echo 04/13/18 normal LVEF, trivial TR.   Previous intolerance to HCTZ, increased doses of Nebivolol, Zetia, Amlodipine (syncope), Lisinopril (dizziness), Nifedipine (fatigue, SOB).  May 2022 presented with lightheadedness, palpitations, presyncope, shortness of breath. Lexiscan myoview was poor quality study. Subsequent cardiac CTA with stable small hiatal hernia, coronary calcium score of 24.9 plaicng him in 65th percentile, moderate (50-69%) mixed plaque stenosis in prox RCA - overall CAD-RADS 3 moderate stenosis. He contacted the office noting palpitations and wore ZIO for 6 days. Average heart rate 68bpm. Predominantly NSR. PAC and PVC with <1% burden, no significant arrhythmia. He had 55 triggered events during wear time which were predominantly associated with NSR or PVCs.   Seen 08/2021 doing overall well from a cardiac perspective.  April 2023 he had bilateral inguinal hernia surgery.Seen 01/20/22 with his husband doing well. Noted increased myalgias with increased Rosuvastatin. He was prescribed Repatha but never stopped it. 11/2021 LDL 122 and repeat 03/20/22 after Repatha LDL 113. Seen 04/15/22 noting chest discomfort with subsequent myoview 04/15/22 no ischemia.   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 He contacted the office noting worsening chest discomfort and started on Amlodipine for antianginal benefit. He took one dose and noted dizziness, chest discomfort at rest, headache.   Tells me he was hopeful he would respond well to Amlodipine  but felt dizzy, short of breath, chest pressure, flushing, headache which lasted about 24 hours. Is staring to feel better now. He is having chest discomfort on his left chest wall and in left upper chest. Feles like a  "burning" sensation with exertion associated with shortness of breath, lightheadedness, near syncope. Also describes as "throbbing" in his left chest. After rest the symptoms improve. No changes with eating or drinking. BP at home mostly 124/89 and one isolated reading SBP 105/82.  EKGs/Labs/Other Studies Reviewed:   The following studies were reviewed today: Myoview 04/22/22   The study is normal. The study is low risk.   The patient reported dyspnea, dizziness, flushing and chest pain (atypical) during the stress test. Normal blood pressure and normal heart rate response noted during stress. Heart rate recovery was normal.   Baseline EKG demonstrated diffuse J point elevated consistent with early repolarization abnormality   No change from baseline early repolarization changes during infusion.   Left ventricular function is normal. Nuclear stress EF: 57 %. The left ventricular ejection fraction is normal (55-65%). End diastolic cavity size is normal. End systolic cavity size is normal.   Prior study available for comparison from 12/12/2020.  Cardiac CTA 01/08/21 FINDINGS: Image quality: Good   Coronary Arteries:  Normal coronary origin.  Right dominance.   Left main: The left main is a large caliber vessel with a normal take off from the left coronary cusp that trifurcates into a LAD, LCX, and ramus intermedius. There is no plaque or stenosis.   Left anterior descending artery: The LAD has minimal (0-24%) soft plaque in the proximal vessel.   Ramus intermedius: Branching vessel; patent with no evidence of plaque or stenosis.   Left circumflex artery: The LCX is non-dominant and patent with no evidence of plaque or stenosis. The LCX gives off 2 large patent obtuse marginal branches.   Right coronary artery: The RCA is dominant with normal take off from the right coronary cusp. There is moderate (50-69%) mixed plaque stenosis in the proximal vessel. The RCA terminates as a small  PDA and right posterolateral branch.   Right Atrium: Right atrial size is within normal limits.   Right Ventricle: The right ventricular cavity is within normal limits.   Left Atrium: Left atrial size is normal in size with no left atrial appendage filling defect.   Left Ventricle: The ventricular cavity size is within normal limits. There are no stigmata of prior infarction. There is no abnormal filling defect.   Pulmonary veins: Normal pulmonary venous drainage.   Pericardium: Normal thickness with no significant effusion or calcium present.   Cardiac valves: The aortic valve is trileaflet without significant calcification. The mitral valve is normal structure without significant calcification.   Aorta: Normal caliber with no significant disease.   Extra-cardiac findings: See attached radiology report for non-cardiac structures.   IMPRESSION: 1. Coronary calcium score of 24.9. This was 105 percentile for age-, sex, and race-matched controls.   2. Normal coronary origin with right dominance.   3. Moderate (50-69%) mixed plaque stenosis in the proximal RCA.   4. Study will be sent for FFR.   RECOMMENDATIONS: CAD-RADS 3: Moderate stenosis. Consider symptom-guided anti-ischemic pharmacotherapy as well as risk factor modification per guideline directed care. Additional analysis with CT FFR will be submitted.   FINDINGS: Image quality: Good   Coronary Arteries:  Normal coronary origin.  Right dominance.   Left main: The left main is a large caliber vessel with a normal take off  from the left coronary cusp that trifurcates into a LAD, LCX, and ramus intermedius. There is no plaque or stenosis.   Left anterior descending artery: The LAD has minimal (0-24%) soft plaque in the proximal vessel.   Ramus intermedius: Branching vessel; patent with no evidence of plaque or stenosis.   Left circumflex artery: The LCX is non-dominant and patent with no evidence of plaque  or stenosis. The LCX gives off 2 large patent obtuse marginal branches.   Right coronary artery: The RCA is dominant with normal take off from the right coronary cusp. There is moderate (50-69%) mixed plaque stenosis in the proximal vessel. The RCA terminates as a small PDA and right posterolateral branch.   Right Atrium: Right atrial size is within normal limits.   Right Ventricle: The right ventricular cavity is within normal limits.   Left Atrium: Left atrial size is normal in size with no left atrial appendage filling defect.   Left Ventricle: The ventricular cavity size is within normal limits. There are no stigmata of prior infarction. There is no abnormal filling defect.   Pulmonary veins: Normal pulmonary venous drainage.   Pericardium: Normal thickness with no significant effusion or calcium present.   Cardiac valves: The aortic valve is trileaflet without significant calcification. The mitral valve is normal structure without significant calcification.   Aorta: Normal caliber with no significant disease.   Extra-cardiac findings: See attached radiology report for non-cardiac structures.   IMPRESSION: 1. Coronary calcium score of 24.9. This was 19 percentile for age-, sex, and race-matched controls.   2. Normal coronary origin with right dominance.   3. Moderate (50-69%) mixed plaque stenosis in the proximal RCA.   4. Study will be sent for FFR.   RECOMMENDATIONS: CAD-RADS 3: Moderate stenosis. Consider symptom-guided anti-ischemic pharmacotherapy as well as risk factor modification per guideline directed care. Additional analysis with CT FFR will be submitted.    1. Left Main: findings 0.99, 0.99 0.99   2. LAD: findings 0.97, 0.91 0.86   3. LCX: findings 0.99, 0.85 0.86; OM2: 0.84, 0.80, 0.77   4. Ramus: findings 0.98, 0.97 0.95   5. RCA: findings 0.98, 0.87 0.86   IMPRESSION: FFR abnormal in very distal OM2 but otherwise normal; medical therapy  likely best option.  Lexiscan Myoview 12/03/15: LVEF 61%.  No ischemia.   Carotid artery Doppler 12/05/15: 1-39% ICA stenosis bilaterally   Echo 12/03/15: Normal LV size and function.  Mild MR, trace TR.   Echo 04/13/18: LVEF 55-60%.  Trivial TR.   48 Hour Event Monitor 04/13/18:   Quality: Fair.  Baseline artifact. Predominant rhythm: sinus rhythm Average heart rate: 67 bpm Max heart rate: 120 bpm Min heart rate: 47 bpm   Rare PVCs and occasional PACs 3 beats of NSVT   Patient reported episodes of chest pain and jaw pain, at which time sinus rhythm was noted.  EKG: EKG independently reviewed from 04/15/22 with NSR rate 60bpm with no ST/T wave changes.   Recent Labs: 01/11/2022: Hemoglobin 14.5; Platelets 320 03/20/2022: ALT 13; BUN 10; Creatinine, Ser 0.88; Potassium 4.6; Sodium 140  Recent Lipid Panel    Component Value Date/Time   CHOL 197 03/20/2022 0949   TRIG 227 (H) 03/20/2022 0949   HDL 45 03/20/2022 0949   CHOLHDL 4.4 03/20/2022 0949   LDLCALC 113 (H) 03/20/2022 0949    Home Medications   Current Meds  Medication Sig   aspirin EC 81 MG tablet Take 1 tablet (81 mg total) by mouth daily.  Swallow whole.   Cholecalciferol (VITAMIN D3) 250 MCG (10000 UT) capsule Take 5,000 Units by mouth daily.   Krill Oil 500 MG CAPS Take by mouth. 1 tablet in the morning and 2 tablets in the evening   loperamide (IMODIUM A-D) 2 MG tablet Take 2 mg by mouth 2 (two) times daily.   nebivolol (BYSTOLIC) 10 MG tablet Take 1 tablet (10 mg total) by mouth daily.   nitroGLYCERIN (NITROSTAT) 0.4 MG SL tablet Place 1 tablet (0.4 mg total) under the tongue every 5 (five) minutes as needed for chest pain.   NP THYROID 90 MG tablet Take 90 mg by mouth daily.   Probiotic Product (PROBIOTIC DAILY PO) Take 2 capsules by mouth daily.   rosuvastatin (CRESTOR) 5 MG tablet Take 1 tablet (5 mg total) by mouth daily. TAKE 1 TABLET BY MOUTH Monday, Wednesday, AND Friday ONLY   sildenafil (VIAGRA) 50 MG  tablet Take 25 mg by mouth daily as needed for erectile dysfunction.   tadalafil (CIALIS) 10 MG tablet Take 1 tablet (10 mg total) by mouth daily as needed for erectile dysfunction.   valsartan (DIOVAN) 80 MG tablet Take 1 tablet (80 mg total) by mouth daily.   vitamin E 180 MG (400 UNITS) capsule Take 400 Units by mouth daily.     Review of Systems   All other systems reviewed and are otherwise negative except as noted above.  Physical Exam    VS:  BP 118/76   Pulse (!) 59   Ht 6' (1.829 m)   Wt 178 lb 14.4 oz (81.1 kg)   BMI 24.26 kg/m  , BMI Body mass index is 24.26 kg/m.  Wt Readings from Last 3 Encounters:  04/29/22 178 lb 14.4 oz (81.1 kg)  04/22/22 179 lb (81.2 kg)  04/15/22 179 lb 6.4 oz (81.4 kg)    GEN: Well nourished, well developed, in no acute distress. HEENT: normal. Neck: Supple, no JVD, carotid bruits, or masses. Cardiac: RRR, no murmurs, rubs, or gallops. No clubbing, cyanosis, edema.  Radials/PT 2+ and equal bilaterally.  Respiratory:  Respirations regular and unlabored, clear to auscultation bilaterally. GI: Soft, nontender, nondistended. MS: No deformity or atrophy. Skin: Warm and dry, no rash. Neuro:  Strength and sensation are intact. Psych: Normal affect.  Assessment & Plan    Chest Pressure / CAD / Aortic atherosclerosis:  He has moderate (50-69%) RCA stenosis per CTA 12/2020. Lexiscan myoview 04/22/22 with no ischemia. Unable to use Imdur due to cialis/sildenafil. Would defer use of Ranolazine due to lightheadedness/dizziness.Took one tablet of Amlodipine 2.5 and reported lightheadedness, headache.  GDMT includes Nebivolol, Rosuvastatin, Aspirin, PRN nitroglycerin. Still with chest discomfort that is left sided and exertional. Has not taken nitroglycerin. Discussed risk and benefits of Coronary Catheterization - he politely declines. Pt requests to proceed with  Cardiac CTA. ED precautions reviewed if CP worsens, plan to proceed with LHC. Discussed that  if symptoms worsen or if cardiac CTA with significant obstruction, would require LHC.   Palpitations - ZIO monitor 01/2021 with predominantly NSR, average heart rate 68bpm, <1% PAC and PVC burden. Triggered episodes associated with NSR and PVC/PAC. Continue Bystolic '10mg'$  QD. Recommend stay well hydrated, avoid caffeine, manage stress well. He denies palpitations in clinic today.   HTN - BP is at goal in clinic and well controlled at home. We will continue his current antihypertensive regimen and he will contact us if he has persistently elevated BP. If he develops any hypotension he will spilt his Valsartan  in half.   HLD, LDL goal <70 -  04/2021 LDL 130. 03/2022 LDL 113. Tolerates rosuvastatin 5 mg 3 times per week.  Did not tolerate increased dose. Has appointment with [facility] pharmacist to discuss possible addition of PCSK9 on 11/6.     ED - Continue to follow with PCP, urology.   Disposition: Follow up as scheduled with Dr. Oval Linsey or APP or sooner regarding testing results.  Signed, Loel Dubonnet, NP 04/29/2022, 9:59 AM [place]

## 2022-04-29 ENCOUNTER — Ambulatory Visit (INDEPENDENT_AMBULATORY_CARE_PROVIDER_SITE_OTHER): Payer: BC Managed Care – PPO | Admitting: Family

## 2022-04-29 ENCOUNTER — Encounter (HOSPITAL_BASED_OUTPATIENT_CLINIC_OR_DEPARTMENT_OTHER): Payer: Self-pay | Admitting: Family

## 2022-04-29 VITALS — BP 118/76 | HR 59 | Ht 72.0 in | Wt 178.9 lb

## 2022-04-29 DIAGNOSIS — I10 Essential (primary) hypertension: Secondary | ICD-10-CM

## 2022-04-29 DIAGNOSIS — I493 Ventricular premature depolarization: Secondary | ICD-10-CM | POA: Diagnosis not present

## 2022-04-29 DIAGNOSIS — R072 Precordial pain: Secondary | ICD-10-CM

## 2022-04-29 DIAGNOSIS — I25118 Atherosclerotic heart disease of native coronary artery with other forms of angina pectoris: Secondary | ICD-10-CM

## 2022-04-29 DIAGNOSIS — E785 Hyperlipidemia, unspecified: Secondary | ICD-10-CM

## 2022-04-29 NOTE — Patient Instructions (Signed)
Medication Instructions:  Continue your same medications.   *If you need a refill on your cardiac medications before your next appointment, please call your pharmacy*  Lab Work: Your physician recommends that you return for lab work today: BMP  If you have labs (blood work) drawn today and your tests are completely normal, you will receive your results only by: [place] (if you have MyChart) OR A paper copy in the mail If you have any lab test that is abnormal or we need to change your treatment, we will call you to review the results.  Testing/Procedures: Your physician has requested that you have cardiac CT. Cardiac computed tomography (CT) is a painless test that uses an x-ray machine to take clear, detailed pictures of your heart.Please follow instruction sheet as given.  Follow-Up: At [facility], you and your health needs are our priority.  As part of our continuing mission to provide you with exceptional heart care, we have created designated Provider Care Teams.  These Care Teams include your primary Cardiologist (physician) and Advanced Practice Providers (APPs -  Physician Assistants and Nurse Practitioners) who all work together to provide you with the care you need, when you need it.  We recommend signing up for the patient portal called "MyChart".  Sign up information is provided on this After Visit Summary.  MyChart is used to connect with patients for Virtual Visits (Telemedicine).  Patients are able to view lab/test results, encounter notes, upcoming appointments, etc.  Non-urgent messages can be sent to your provider as well.   To learn more about what you can do with MyChart, go to [email or web address].    Your next appointment:   As scheduled with Dr. Oval Linsey or sooner as needed pending test results  Other Instructions    Your cardiac CT will be scheduled at one of the below locations:   [facility] [address] [place],  [place] [address] [phone]  [place] [address] [place], [place] [address] [phone]  [facility] [place], [place] [address] [phone]  If scheduled at [facility], please arrive at the [facility] Entrance (Entrance C2) of [facility] 30 minutes prior to test start time. You can use the FREE valet parking offered at entrance C (encouraged to control the heart rate for the test)  Proceed to the [facility] Radiology Department (first floor) to check-in and test prep.  All radiology patients and guests should use entrance C2 at [facility], accessed from [facility], even though the hospital's physical address listed is [address].    If scheduled at [facility] or [facility], please arrive 15 mins early for check-in and test prep.   Please follow these instructions carefully (unless otherwise directed):  Hold all erectile dysfunction medications at least 3 days (72 hrs) prior to test. (Ie viagra, cialis, sildenafil, tadalafil, etc) We will administer nitroglycerin during this exam.   On the Night Before the Test: Be sure to Drink plenty of water. Do not consume any caffeinated/decaffeinated beverages or chocolate 12 hours prior to your test. Do not take any antihistamines 12 hours prior to your test.  On the Day of the Test: Drink plenty of water until 1 hour prior to the test. Do not eat any food 1 hour prior to test. You may take your regular medications  prior to the test.   After the Test: Drink plenty of water. After receiving IV contrast, you may experience a mild flushed feeling. This is normal. On occasion, you may experience a mild rash up to 24 hours after the test. This is not dangerous. If this occurs, you can take Benadryl 25 mg and increase your fluid  intake. If you experience trouble breathing, this can be serious. If it is severe call 911 IMMEDIATELY. If it is mild, please call our office. If you take any of these medications: Glipizide/Metformin, Avandament, Glucavance, please do not take 48 hours after completing test unless otherwise instructed.  We will call to schedule your test 2-4 weeks out understanding that some insurance companies will need an authorization prior to the service being performed.   For non-scheduling related questions, please contact the cardiac imaging nurse navigator should you have any questions/concerns: Marchia Bond, Cardiac Imaging Nurse Navigator Gordy Clement, Cardiac Imaging Nurse Navigator [facility] Direct Office Dial: [phone]   For scheduling needs, including cancellations and rescheduling, please call [place], [phone].

## 2022-04-30 LAB — BASIC METABOLIC PANEL
BUN/Creatinine Ratio: 11 (ref 9–20)
BUN: 10 mg/dL (ref 6–24)
CO2: 21 mmol/L (ref 20–29)
Calcium: 9.8 mg/dL (ref 8.7–10.2)
Chloride: 100 mmol/L (ref 96–106)
Creatinine, Ser: 0.95 mg/dL (ref 0.76–1.27)
Glucose: 74 mg/dL (ref 70–99)
Potassium: 4.4 mmol/L (ref 3.5–5.2)
Sodium: 139 mmol/L (ref 134–144)
eGFR: 94 mL/min/{1.73_m2} (ref >59)

## 2022-05-02 ENCOUNTER — Ambulatory Visit (HOSPITAL_BASED_OUTPATIENT_CLINIC_OR_DEPARTMENT_OTHER): Payer: BC Managed Care – PPO | Admitting: Family

## 2022-05-12 ENCOUNTER — Telehealth (HOSPITAL_COMMUNITY): Payer: Self-pay | Admitting: *Deleted

## 2022-05-12 NOTE — Telephone Encounter (Signed)
Attempted to call patient regarding upcoming cardiac CT appointment. °Left message on voicemail with name and callback number ° °Airiel Oblinger RN Navigator Cardiac Imaging °[facility] °[phone] Office °[phone] Cell ° °

## 2022-05-12 NOTE — Telephone Encounter (Signed)
Patient returning call regarding upcoming cardiac imaging study; pt verbalizes understanding of appt date/time, parking situation and where to check in,  and verified current allergies; name and call back number provided for further questions should they arise  Sean Clement RN Navigator Cardiac [place] [facility] [phone] office [phone] cell  Patient aware to arrive at 9:30am.

## 2022-05-13 ENCOUNTER — Telehealth (HOSPITAL_BASED_OUTPATIENT_CLINIC_OR_DEPARTMENT_OTHER): Payer: Self-pay | Admitting: Family

## 2022-05-13 ENCOUNTER — Other Ambulatory Visit (HOSPITAL_BASED_OUTPATIENT_CLINIC_OR_DEPARTMENT_OTHER): Payer: Self-pay | Admitting: Family

## 2022-05-13 ENCOUNTER — Ambulatory Visit (HOSPITAL_BASED_OUTPATIENT_CLINIC_OR_DEPARTMENT_OTHER)
Admission: RE | Admit: 2022-05-13 | Discharge: 2022-05-13 | Disposition: A | Discharge status: Home / Self Care | Payer: BC Managed Care – PPO | Source: Ambulatory Visit | Attending: Cardiology | Admitting: Cardiology

## 2022-05-13 ENCOUNTER — Other Ambulatory Visit: Payer: Self-pay | Admitting: Cardiology

## 2022-05-13 ENCOUNTER — Ambulatory Visit (HOSPITAL_COMMUNITY)
Admission: RE | Admit: 2022-05-13 | Discharge: 2022-05-13 | Disposition: A | Discharge status: Home / Self Care | Payer: BC Managed Care – PPO | Source: Ambulatory Visit | Attending: Family | Admitting: Family

## 2022-05-13 DIAGNOSIS — I25118 Atherosclerotic heart disease of native coronary artery with other forms of angina pectoris: Secondary | ICD-10-CM | POA: Insufficient documentation

## 2022-05-13 DIAGNOSIS — I2510 Atherosclerotic heart disease of native coronary artery without angina pectoris: Secondary | ICD-10-CM

## 2022-05-13 DIAGNOSIS — R931 Abnormal findings on diagnostic imaging of heart and coronary circulation: Secondary | ICD-10-CM | POA: Insufficient documentation

## 2022-05-13 DIAGNOSIS — R072 Precordial pain: Secondary | ICD-10-CM | POA: Diagnosis present

## 2022-05-13 DIAGNOSIS — I10 Essential (primary) hypertension: Secondary | ICD-10-CM

## 2022-05-13 DIAGNOSIS — E785 Hyperlipidemia, unspecified: Secondary | ICD-10-CM

## 2022-05-13 MED ORDER — IOHEXOL 350 MG/ML SOLN
100.0000 mL | Freq: Once | INTRAVENOUS | Status: AC | PRN
  Administered 2022-05-13: 100 mL via INTRAVENOUS

## 2022-05-13 MED ORDER — NITROGLYCERIN 0.4 MG SL SUBL
SUBLINGUAL_TABLET | SUBLINGUAL | Status: AC
  Filled 2022-05-13: qty 2

## 2022-05-13 MED ORDER — NITROGLYCERIN 0.4 MG SL SUBL
0.8000 mg | SUBLINGUAL_TABLET | Freq: Once | SUBLINGUAL | Status: AC
  Administered 2022-05-13: 0.8 mg via SUBLINGUAL

## 2022-05-13 NOTE — Telephone Encounter (Signed)
Cardiac CTA with severe mixed plaque in RCA with FFR 0.73, 0.74 indicative of flow limiting stenosis. Detailed results below.  Called to discuss with patient. He is agreeable to cardiac catheterization. He will send [place] a MyChart message with preferred date as his husband will need to be off work to drive him home.  Shared Decision Making/Informed Consent The risks [stroke (1 in 1000), death (1 in 1000), kidney failure [usually temporary] (1 in 500), bleeding (1 in 200), allergic reaction [possibly serious] (1 in 200)], benefits (diagnostic support and management of coronary artery disease) and alternatives of a cardiac catheterization were discussed in detail with Mr. Holleran and he is willing to proceed.   Loel Dubonnet, NP  ________________________________  Cardiac CTA 05/13/22 IMPRESSION: 1. Coronary calcium score of 60.8. This was 40 percentile for age-, sex, and race-matched controls.   2. Normal coronary origin with right dominance.   3. Severe (70-99) mixed plaque stenosis in the proximal RCA.   4. Study will be sent for FFR.   RECOMMENDATIONS: CAD-RADS 4: Severe stenosis. (70-99% or > 50% left main). Cardiac catheterization or CT FFR is recommended. Consider symptom-guided anti-ischemic pharmacotherapy as well as risk factor modification per guideline directed care. Invasive coronary angiography recommended with revascularization per published guideline statements.  FFR 05/13/22 1. Left Main: findings   2. LAD: findings 0.85   3. LCX: findings 0.93, 0.86 0.76   4.     RCA: findings 0.73, 0.74   IMPRESSION: FFR suggests RCA stenosis is flow limiting; mildly abnormal FFR distal Lcx likely due to tapering.

## 2022-05-13 NOTE — Telephone Encounter (Signed)
Patient returned call to patient and provided the cath instructions that were sent through mychart message to patient. Patient will have labs done and knows which [facility] he can go to.

## 2022-05-13 NOTE — Telephone Encounter (Signed)
Per message from patient Tuesday, Wednesday and Friday of next week are best options for Cath...  RN called cath lab, patient scheduled for 11/7 at 7:30am with arrival time of 5:30am. Will order BMP and CBC at verbal order of Laurann Montana, NP    Instructions sent to patient mychart attempted to call patient, no answer, left VM with instructions to call back.

## 2022-05-13 NOTE — Addendum Note (Signed)
Addended by: Gerald Stabs on: 05/13/2022 01:51 PM   Modules accepted: Orders

## 2022-05-15 LAB — BASIC METABOLIC PANEL
BUN/Creatinine Ratio: 11 (ref 9–20)
BUN: 10 mg/dL (ref 6–24)
CO2: 25 mmol/L (ref 20–29)
Calcium: 9.1 mg/dL (ref 8.7–10.2)
Chloride: 103 mmol/L (ref 96–106)
Creatinine, Ser: 0.89 mg/dL (ref 0.76–1.27)
Glucose: 91 mg/dL (ref 70–99)
Potassium: 4.2 mmol/L (ref 3.5–5.2)
Sodium: 142 mmol/L (ref 134–144)
eGFR: 101 mL/min/{1.73_m2} (ref >59)

## 2022-05-15 LAB — CBC
Hematocrit: 47.3 % (ref 37.5–51.0)
Hemoglobin: 15.7 g/dL (ref 13.0–17.7)
MCH: 28.2 pg (ref 26.6–33.0)
MCHC: 33.2 g/dL (ref 31.5–35.7)
MCV: 85 fL (ref 79–97)
Platelets: 333 10*3/uL (ref 150–450)
RBC: 5.56 x10E6/uL (ref 4.14–5.80)
RDW: 13.3 % (ref 11.6–15.4)
WBC: 6.5 10*3/uL (ref 3.4–10.8)

## 2022-05-18 ENCOUNTER — Encounter (HOSPITAL_BASED_OUTPATIENT_CLINIC_OR_DEPARTMENT_OTHER): Payer: Self-pay | Admitting: Emergency Medicine

## 2022-05-18 ENCOUNTER — Other Ambulatory Visit: Payer: Self-pay

## 2022-05-18 ENCOUNTER — Emergency Department (HOSPITAL_BASED_OUTPATIENT_CLINIC_OR_DEPARTMENT_OTHER): Payer: BC Managed Care – PPO

## 2022-05-18 ENCOUNTER — Emergency Department (HOSPITAL_BASED_OUTPATIENT_CLINIC_OR_DEPARTMENT_OTHER)
Admission: EM | Admit: 2022-05-18 | Discharge: 2022-05-19 | Disposition: A | Discharge status: Home / Self Care | Payer: BC Managed Care – PPO | Attending: Emergency Medicine | Admitting: Emergency Medicine

## 2022-05-18 DIAGNOSIS — I158 Other secondary hypertension: Secondary | ICD-10-CM | POA: Insufficient documentation

## 2022-05-18 DIAGNOSIS — R079 Chest pain, unspecified: Secondary | ICD-10-CM | POA: Diagnosis present

## 2022-05-18 DIAGNOSIS — I10 Essential (primary) hypertension: Secondary | ICD-10-CM | POA: Insufficient documentation

## 2022-05-18 DIAGNOSIS — Z7982 Long term (current) use of aspirin: Secondary | ICD-10-CM | POA: Insufficient documentation

## 2022-05-18 DIAGNOSIS — I2510 Atherosclerotic heart disease of native coronary artery without angina pectoris: Secondary | ICD-10-CM | POA: Diagnosis not present

## 2022-05-18 DIAGNOSIS — R0789 Other chest pain: Secondary | ICD-10-CM

## 2022-05-18 LAB — CBC WITH DIFFERENTIAL/PLATELET
Abs Immature Granulocytes: 0.01 10*3/uL (ref 0.00–0.07)
Basophils Absolute: 0.1 10*3/uL (ref 0.0–0.1)
Basophils Relative: 1 %
Eosinophils Absolute: 0.3 10*3/uL (ref 0.0–0.5)
Eosinophils Relative: 5 %
HCT: 45.5 % (ref 39.0–52.0)
Hemoglobin: 14.9 g/dL (ref 13.0–17.0)
Immature Granulocytes: 0 %
Lymphocytes Relative: 44 %
Lymphs Abs: 3.1 10*3/uL (ref 0.7–4.0)
MCH: 28 pg (ref 26.0–34.0)
MCHC: 32.7 g/dL (ref 30.0–36.0)
MCV: 85.5 fL (ref 80.0–100.0)
Monocytes Absolute: 0.6 10*3/uL (ref 0.1–1.0)
Monocytes Relative: 9 %
Neutro Abs: 2.9 10*3/uL (ref 1.7–7.7)
Neutrophils Relative %: 41 %
Platelets: 323 10*3/uL (ref 150–400)
RBC: 5.32 MIL/uL (ref 4.22–5.81)
RDW: 13 % (ref 11.5–15.5)
WBC: 7 10*3/uL (ref 4.0–10.5)
nRBC: 0 % (ref 0.0–0.2)

## 2022-05-18 LAB — COMPREHENSIVE METABOLIC PANEL
ALT: 14 U/L (ref 0–44)
AST: 12 U/L — ABNORMAL LOW (ref 15–41)
Albumin: 4.4 g/dL (ref 3.5–5.0)
Alkaline Phosphatase: 43 U/L (ref 38–126)
Anion gap: 8 (ref 5–15)
BUN: 10 mg/dL (ref 6–20)
CO2: 28 mmol/L (ref 22–32)
Calcium: 9.5 mg/dL (ref 8.9–10.3)
Chloride: 103 mmol/L (ref 98–111)
Creatinine, Ser: 0.83 mg/dL (ref 0.61–1.24)
GFR, Estimated: >60 mL/min (ref >60)
Glucose, Bld: 101 mg/dL — ABNORMAL HIGH (ref 70–99)
Potassium: 3.6 mmol/L (ref 3.5–5.1)
Sodium: 139 mmol/L (ref 135–145)
Total Bilirubin: 0.5 mg/dL (ref 0.3–1.2)
Total Protein: 7.7 g/dL (ref 6.5–8.1)

## 2022-05-18 LAB — TROPONIN I (HIGH SENSITIVITY): Troponin I (High Sensitivity): 3 ng/L (ref <18)

## 2022-05-18 NOTE — ED Triage Notes (Signed)
Patient c/o chest pressure radiating to left arm and jaw x 3 weeks since stress test.  Patient has a cardiac cath scheduled in two days.  Patient also endorses htn at home, and has taken an extra BP med per MD.

## 2022-05-18 NOTE — ED Notes (Signed)
Blue and gold tubes also drawn and sent to lab

## 2022-05-19 ENCOUNTER — Other Ambulatory Visit: Payer: Self-pay | Admitting: Pharmacist Clinician (PhC)/ Clinical Pharmacy Specialist

## 2022-05-19 ENCOUNTER — Encounter (HOSPITAL_COMMUNITY): Payer: Self-pay

## 2022-05-19 ENCOUNTER — Encounter: Payer: Self-pay | Admitting: Pharmacist Clinician (PhC)/ Clinical Pharmacy Specialist

## 2022-05-19 ENCOUNTER — Ambulatory Visit
Payer: BC Managed Care – PPO | Attending: Cardiology | Admitting: Pharmacist Clinician (PhC)/ Clinical Pharmacy Specialist

## 2022-05-19 ENCOUNTER — Telehealth: Payer: Self-pay | Admitting: *Deleted

## 2022-05-19 VITALS — BP 142/91

## 2022-05-19 DIAGNOSIS — E7800 Pure hypercholesterolemia, unspecified: Secondary | ICD-10-CM

## 2022-05-19 DIAGNOSIS — I10 Essential (primary) hypertension: Secondary | ICD-10-CM

## 2022-05-19 LAB — TROPONIN I (HIGH SENSITIVITY): Troponin I (High Sensitivity): 3 ng/L (ref <18)

## 2022-05-19 MED ORDER — LIDOCAINE VISCOUS HCL 2 % MT SOLN
15.0000 mL | Freq: Once | OROMUCOSAL | Status: AC
  Administered 2022-05-19: 15 mL via ORAL
  Filled 2022-05-19: qty 15

## 2022-05-19 MED ORDER — VALSARTAN 160 MG PO TABS: 160.0000 mg | ORAL_TABLET | Freq: Every day | ORAL | 3 refills | Status: DC

## 2022-05-19 MED ORDER — ALUM & MAG HYDROXIDE-SIMETH 200-200-20 MG/5ML PO SUSP
30.0000 mL | Freq: Once | ORAL | Status: AC
  Administered 2022-05-19: 30 mL via ORAL
  Filled 2022-05-19: qty 30

## 2022-05-19 MED ORDER — REPATHA SURECLICK 140 MG/ML ~~LOC~~ SOAJ: 140.0000 mg | SUBCUTANEOUS | 12 refills | Status: DC

## 2022-05-19 NOTE — Consult Note (Signed)
Chest pain with recent coronary CTA demonstrating high-grade RCA stenosis. Coronary angiography and possible PCI.

## 2022-05-19 NOTE — Telephone Encounter (Signed)
Per CMM, PA resolved

## 2022-05-19 NOTE — ED Provider Notes (Addendum)
[facility] EMERGENCY DEPT Provider Note  CSN: [id number] Arrival date & time: 05/18/22 2242  Chief Complaint(s) Chest Pain  HPI Sean Mckay is a 56 y.o. male    The history is provided by the patient.  Chest Pain Chest pain location: different places (left anterior, lateral, back) Pain quality: pressure   Pain radiates to:  L jaw and L arm (sometimes) Duration:  3 weeks Timing:  Intermittent Progression:  Waxing and waning Chronicity:  New Context comment:  Random Relieved by:  Nothing Worsened by:  Nothing Associated symptoms: shortness of breath   Associated symptoms: no nausea    Noted BP in 190s tonight. Spoke with oncall cardiologist who recommended going to ER.   Past Medical History Past Medical History:  Diagnosis Date   Adrenal adenoma 05/09/2016   CAD in native artery 04/30/2021   Essential hypertension 05/09/2016   Hyperlipidemia    Hypertriglyceridemia 05/09/2016   Palpitations 12/06/2020   Pure hypercholesterolemia 12/06/2020   Patient Active Problem List   Diagnosis Date Noted   CAD in native artery 04/30/2021   Palpitations 12/06/2020   Pure hypercholesterolemia 12/06/2020   Medication intolerance 16-Jul-2018   Family history of sudden cardiac death Jul 16, 2018   Chest pain 2018/07/16   SOB (shortness of breath) July 16, 2018   PVC's (premature ventricular contractions) 2018-07-16   Essential hypertension 05/09/2016   Adrenal adenoma 05/09/2016   Pes anserinus bursitis of right knee 10/20/2011   Home Medication(s) Prior to Admission medications   Medication Sig Start Date End Date Taking? Authorizing Provider  aspirin EC 81 MG tablet Take 1 tablet (81 mg total) by mouth daily. Swallow whole. 01/24/21   Skeet Latch, MD  Cholecalciferol (VITAMIN D3) 125 MCG (5000 UT) CAPS Take 5,000 Units by mouth daily.    [provider]  Coenzyme Q10 100 MG capsule Take 100 mg by mouth daily.    [provider]   cyanocobalamin (VITAMIN B12) 500 MCG tablet Take 500 mcg by mouth daily.    [provider]  Astrid Drafts 500 MG CAPS Take 1-2 capsules by mouth See admin instructions. 2 caps in the morning and 1 caps in the evening    [provider]  loperamide (IMODIUM A-D) 2 MG tablet Take 4 mg by mouth daily.    [provider]  nebivolol (BYSTOLIC) 10 MG tablet Take 1 tablet (10 mg total) by mouth daily. 04/15/22   Loel Dubonnet, NP  nitroGLYCERIN (NITROSTAT) 0.4 MG SL tablet Place 1 tablet (0.4 mg total) under the tongue every 5 (five) minutes as needed for chest pain. 04/25/22 07/24/22  Loel Dubonnet, NP  NP THYROID 90 MG tablet Take 90 mg by mouth daily. 12/03/21   [provider]  rosuvastatin (CRESTOR) 5 MG tablet Take 1 tablet (5 mg total) by mouth daily. TAKE 1 TABLET BY MOUTH Monday, Wednesday, AND Friday ONLY Patient taking differently: Take 5 mg by mouth every Monday, Wednesday, and Friday. TAKE 1 TABLET BY MOUTH Monday, Wednesday, AND Friday ONLY 08/14/21   Loel Dubonnet, NP  sildenafil (VIAGRA) 50 MG tablet Take 25 mg by mouth daily as needed for erectile dysfunction.    [provider]  tadalafil (CIALIS) 10 MG tablet Take 1 tablet (10 mg total) by mouth daily as needed for erectile dysfunction. 02/10/22   Loel Dubonnet, NP  valsartan (DIOVAN) 80 MG tablet Take 1 tablet (80 mg total) by mouth daily. 03/25/22   Skeet Latch, MD  vitamin E 180 MG (  400 UNITS) capsule Take 400 Units by mouth daily.    [provider]                                                                                                                                    Allergies Ciprofloxacin, Flomax [tamsulosin], Hydralazine, Hydrochlorothiazide, Lisinopril, Mobic [meloxicam], Nifedipine, Norvasc [amlodipine besylate], Prednisone, Statins, Promethazine, and Vytorin [ezetimibe-simvastatin]  Review of Systems Review of Systems  Respiratory:  Positive for  shortness of breath.   Cardiovascular:  Positive for chest pain.  Gastrointestinal:  Negative for nausea.   As noted in HPI  Physical Exam Vital Signs  I have reviewed the triage vital signs BP (!) 135/97   Pulse (!) 51   Temp 98.1 F (36.7 C)   Resp 15   Ht 6' (1.829 m)   Wt 81.6 kg   SpO2 95%   BMI 24.41 kg/m   Physical Exam Vitals reviewed.  Constitutional:      General: He is not in acute distress.    Appearance: He is well-developed. He is not diaphoretic.  HENT:     Head: Normocephalic and atraumatic.     Nose: Nose normal.  Eyes:     General: No scleral icterus.       Right eye: No discharge.        Left eye: No discharge.     Conjunctiva/sclera: Conjunctivae normal.     Pupils: Pupils are equal, round, and reactive to light.  Cardiovascular:     Rate and Rhythm: Normal rate and regular rhythm.     Heart sounds: No murmur heard.    No friction rub. No gallop.  Pulmonary:     Effort: Pulmonary effort is normal. No respiratory distress.     Breath sounds: Normal breath sounds. No stridor. No rales.  Abdominal:     General: There is no distension.     Palpations: Abdomen is soft.     Tenderness: There is no abdominal tenderness.  Musculoskeletal:        General: No tenderness.     Cervical back: Normal range of motion and neck supple.  Skin:    General: Skin is warm and dry.     Findings: No erythema or rash.  Neurological:     Mental Status: He is alert and oriented to person, place, and time.     ED Results and Treatments Labs (all labs ordered are listed, but only abnormal results are displayed) Labs Reviewed  COMPREHENSIVE METABOLIC PANEL - Abnormal; Notable for the following components:      Result Value   Glucose, Bld 101 (*)    AST 12 (*)    All other components within normal limits  CBC WITH DIFFERENTIAL/PLATELET  TROPONIN I (HIGH SENSITIVITY)  TROPONIN I (HIGH SENSITIVITY)  EKG  EKG Interpretation  Date/Time: 05/18/22  10:50:52pm   Ventricular Rate: 58   PR Interval:  172  QRS Duration:  102 QT Interval:  386  QTC Calculation:  378 R Axis:    84 Text Interpretation:  Sinus bradycardia. No significant changes       Radiology DG Chest Port 1 View  Result Date: 05/18/2022 CLINICAL DATA:  [id number] with chest pain. EXAM: PORTABLE CHEST 1 VIEW COMPARISON:  PA Lat chest 01/11/2022 FINDINGS: The heart size and mediastinal contours are within normal limits. Both lungs are clear. The visualized skeletal structures are unremarkable. IMPRESSION: No active disease.  Stable chest. Electronically Signed   By: Telford Nab M.D.   On: 05/18/2022 23:17    Medications Ordered in ED Medications  alum & mag hydroxide-simeth (MAALOX/MYLANTA) 200-200-20 MG/5ML suspension 30 mL (30 mLs Oral Given 05/19/22 0159)    And  lidocaine (XYLOCAINE) 2 % viscous mouth solution 15 mL (15 mLs Oral Given 05/19/22 0159)                                                                                                                                     Procedures Procedures  (including critical care time)  Medical Decision Making / ED Course   Medical Decision Making Amount and/or Complexity of Data Reviewed Labs: ordered. Decision-making details documented in ED Course. Radiology: ordered and independent interpretation performed. Decision-making details documented in ED Course. ECG/medicine tests: ordered and independent interpretation performed. Decision-making details documented in ED Course.  Risk OTC drugs. Prescription drug management. Decision regarding hospitalization.     Complexity of Problem:  Patient's presenting problem/concern, DDX, and MDM listed below: Intermittent chest pain Atypical for ACS but patient does have recent CT coronary scan with decreased flow to the RCA scheduled for left heart cath.  Will  obtain cardiac work-up. Presentation is not classic for arctic dissection or esophageal perforation. Low suspicion for pulmonary embolism Possible GI process Hypertension Has improved without intervention Patient reports episodes in the past of blood pressure spiking no prior work-up per patient. He has a history of adrenal adenoma noted on past medical history.     Complexity of Data:   Cardiac Monitoring/EKG: EKG without acute ischemic changes or evidence of pericarditis  Laboratory Tests ordered listed below with my independent interpretation: CBC without leukocytosis or anemia CMP without significant electrolyte derangements or renal sufficiency Serial troponins negative x2   Imaging Studies ordered listed below with my independent interpretation: Chest x-ray without evidence of pneumonia, pneumothorax, pulmonary edema or pleural effusions.     ED Course:    Assessment, Add'l Intervention, and Reassessment: Intermittent chest discomfort Feel the work-up is sufficient to rule out ACS and confirmed by on-call cardiologist Dr. Humphrey Rolls, who was the one who spoke with the patient earlier.  Dr.Khan mentioned that he was more concerned for the elevated blood pressure. He did not feel that admission for continued  work-up was necessary.  Recommended he keep his appointment for left heart cath. GI cocktail did not provide relief. He declined any other medication. Hypertension Resolved since being here without intervention Patient denies taking any over-the-counter medicine that would elevate his blood pressure, he denied any illicit drug use. Recommended patient follow-up with PCP for additional work-up       Final Clinical Impression(s) / ED Diagnoses Final diagnoses:  Chest discomfort  Other secondary hypertension   The patient appears reasonably screened and/or stabilized for discharge and I doubt any other medical condition or other [facility] requiring further screening, evaluation,  or treatment in the ED at this time. I have discussed the findings, Dx and Tx plan with the patient/family who expressed understanding and agree(s) with the plan. Discharge instructions discussed at length. The patient/family was given strict return precautions who verbalized understanding of the instructions. No further questions at time of discharge.  Disposition: Discharge  Condition: Good  ED Discharge Orders     None         Follow Up: Hayden Rasmussen, MD [facility] [address] [phone]  Call  to schedule an appointment for close follow up           This chart was dictated using voice recognition software.  Despite best efforts to proofread,  errors can occur which can change the documentation meaning.       Fatima Blank, MD 05/19/22 [phone]

## 2022-05-19 NOTE — Progress Notes (Signed)
05/19/2022 Sean Mckay December 20, 1965 [id number]   HPI:  Sean Mckay is a 56 y.o. male patient of Dr Oval Linsey, who presents today for a lipid clinic evaluation.  See pertinent past medical history below.  He was most recently seen by Laurann Montana for chest pains and is scheduled for a coronary CTA tomorrow.  Per chart he has multiple drug sensitivities, but has been able to tolerate rosuvastatin 5 mg three times weekly.  Unfortunately this is not quite enough to get LDL < 70 and he is in the office today to discuss further LDL lowering options.    Yesterday he called the on call cardiologist to report systolic BP > 409.  He was advised to go to ED and was there from 11pm to 3am this morning.  Systolic pressure came down, however diastolic was still at 97.  This morning he reports that it has been elevated for the last 3 weeks, since having his stress test.  About 2 days ago he doubled his valsartan from 80 to 160 on his own.    Past Medical History: hypertension Has been uncontrolled recently - double valsartan to 160 mg  CAD 50-69% RCA stenosis   Chest pain Ongoing, left side, exertional, declined cath, will have CTA 11/7   Insurance Carrier:   [employer] Employees      Current Medications:   rosuvastatin 5 mg MWF     Cholesterol Goals: LDL < 70   Intolerant/previously tried:  simvastatin - myalgias, ezetimibe - GI (already has sever IBS issues)       Social history:  No tobacco, beer occasionally; 4 cans of coke daily  Family history: father had MI, stroke, CABG x 4 died at 97, mother MI CABG x 76, mom now 10, mgf SCD at 39, youngest brother SCD 9, multiple uncles (both sides) with MI's CABG's; no children  Diet: mix of home and eating out; no pork or beef, not much chicken, mostly fish for protein; mostly vegetarian diet; (has trouble processing proteins) IBS; fish and eggs for protein;   Exercise:  active with work, home; 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 Swallow whole. 90 tablet 3   Cholecalciferol (VITAMIN D3) 125 MCG (5000 UT) CAPS Take 5,000 Units by mouth daily.     Coenzyme Q10 100 MG capsule Take 100 mg by mouth daily.     cyanocobalamin (VITAMIN B12) 500 MCG tablet Take 500 mcg by mouth daily.     Krill Oil 500 MG CAPS Take 1-2 capsules by mouth See admin instructions. 2 caps in the morning and 1 caps in the evening     loperamide (IMODIUM A-D) 2 MG tablet Take 4 mg by mouth daily.     nebivolol (BYSTOLIC) 10 MG tablet Take 1 tablet (10 mg total) by mouth daily. 90 tablet 3   nitroGLYCERIN (NITROSTAT) 0.4 MG SL tablet Place 1 tablet (0.4 mg total) under the tongue every 5 (five) minutes as needed for chest pain. 25 tablet 3   NP THYROID 90  MG tablet Take 90 mg by mouth daily.     rosuvastatin (CRESTOR) 5 MG tablet Take 1 tablet (5 mg total) by mouth daily. TAKE 1 TABLET BY MOUTH Monday, Wednesday, AND Friday ONLY (Patient taking differently: Take 5 mg by mouth every Monday, Wednesday, and Friday. TAKE 1 TABLET BY MOUTH Monday, Wednesday, AND Friday ONLY) 90 tablet 3   sildenafil (VIAGRA) 50 MG tablet Take 25 mg by mouth daily as needed for erectile dysfunction.     tadalafil (CIALIS) 10 MG tablet Take 1 tablet (10 mg total) by mouth daily as needed for erectile dysfunction. 30 tablet 1   valsartan (DIOVAN) 80 MG tablet Take 1 tablet (80 mg total) by mouth daily. 90 tablet 3   vitamin E 180 MG (400 UNITS) capsule Take 400 Units by mouth daily.     No current facility-administered medications for this visit.    Allergies  Allergen Reactions   Ciprofloxacin      Headache, Impairment, Felt Sluggish    Flomax [Tamsulosin]     Weakness, Dizziness, Impairment    Hydralazine Other (See Comments)    Fatigue and muscle/joint pain    Hydrochlorothiazide Diarrhea and Other (See Comments)    Tremors, fatigue, cough   Lisinopril Other (See Comments)    Dizziness    Mobic [Meloxicam]     Increased BP    Nifedipine     Pt does not remember reaction    Norvasc [Amlodipine Besylate]     dizziness   Prednisone Other (See Comments)    "vision problems" Blurred vision Visual problem when taken orally    Statins Other (See Comments)    Myalgias (intolerance)   Promethazine Nausea Only        Vytorin [Ezetimibe-Simvastatin] Diarrhea and Rash    Muscle aches     Past Medical History:  Diagnosis Date   Adrenal adenoma 05/09/2016   CAD in native artery 04/30/2021   Essential hypertension 05/09/2016   Hyperlipidemia    Hypertriglyceridemia 05/09/2016   Palpitations 12/06/2020   Pure hypercholesterolemia 12/06/2020    Blood pressure (!) 142/91.   Essential hypertension Patient with recent increase in home BP readings, was seen in ED yesterday.  Will have him increase valsartan to 160 mg daily and continue with nebivolol.  Unsure if the current rise due to stressors over upcoming heart cath or other.  Will see him back in 1 month to follow up on this.    Pure hypercholesterolemia Patient with CAD and hyperlipidemia, LDL not currently at goal of < 55.  Reviewed options for lowering LDL cholesterol, including ezetimibe, PCSK-9 inhibitors, bempedoic acid and inclisiran.  Discussed mechanisms of action, dosing, side effects and potential decreases in LDL cholesterol.  Also reviewed cost information and potential options for patient assistance.  Answered all patient questions. States he has a box of Repatha but has not yet started medication.  Suggested that he start with first injection later this week after his heart catheterization.   Will need to repeat  labs after 3 months.      Tommy Medal PharmD CPP [place] [address] [place],  [address] [phone]

## 2022-05-19 NOTE — Assessment & Plan Note (Signed)
Patient with CAD and hyperlipidemia, LDL not currently at goal of < 55.  Reviewed options for lowering LDL cholesterol, including ezetimibe, PCSK-9 inhibitors, bempedoic acid and inclisiran.  Discussed mechanisms of action, dosing, side effects and potential decreases in LDL cholesterol.  Also reviewed cost information and potential options for patient assistance.  Answered all patient questions. States he has a box of Repatha but has not yet started medication.  Suggested that he start with first injection later this week after his heart catheterization.   Will need to repeat labs after 3 months.

## 2022-05-19 NOTE — Assessment & Plan Note (Signed)
Patient with recent increase in home BP readings, was seen in ED yesterday.  Will have him increase valsartan to 160 mg daily and continue with nebivolol.  Unsure if the current rise due to stressors over upcoming heart cath or other.  Will see him back in 1 month to follow up on this.

## 2022-05-19 NOTE — Telephone Encounter (Signed)
Cardiac Catheterization scheduled at [facility] for: Tuesday May 20, 2022 7:30 AM Arrival time and place: [place] Entrance A at: 5:30 AM  Nothing to eat after midnight prior to procedure, clear liquids until 5 AM day of procedure.  Medication instructions: -Hold:  Cialis/Viagra on hold 72 hours pre-procedure -Except hold medications usual morning medications can be taken with sips of water including aspirin 81 mg.  Confirmed patient has responsible adult to drive home post procedure and be with patient first 24 hours after arriving home.  Patient reports no new symptoms concerning for COVID-19 in the past 10 days.  Reviewed procedure instructions with patient.

## 2022-05-19 NOTE — Patient Instructions (Addendum)
Your Results:             Your most recent labs Goal  Total Cholesterol 197 < 200  Triglycerides 227 < 150  HDL (happy/good cholesterol) 45 > 40  LDL (lousy/bad cholesterol 113 < 55   Medication changes:  Start the Repatha injections this coming weekend.  (Wait until after your procedure)  Increase you valsartan to 160 mg once daily  Lab orders:  We want to repeat labs after 2-3 months.  We will send you a lab order to remind you once we get closer to that time.    Return December 11 at 9:30 am to review blood pressure.   Thank you for choosing [facility]

## 2022-05-20 ENCOUNTER — Encounter (HOSPITAL_COMMUNITY)
Admission: RE | Disposition: A | Discharge status: Home / Self Care | Payer: Self-pay | Source: Home / Self Care | Attending: Interventional Cardiology

## 2022-05-20 ENCOUNTER — Observation Stay (HOSPITAL_COMMUNITY)
Admission: RE | Admit: 2022-05-20 | Discharge: 2022-05-22 | Disposition: A | Discharge status: Home / Self Care | Payer: BC Managed Care – PPO | Attending: Interventional Cardiology | Admitting: Interventional Cardiology

## 2022-05-20 ENCOUNTER — Other Ambulatory Visit (HOSPITAL_COMMUNITY): Payer: Self-pay

## 2022-05-20 ENCOUNTER — Other Ambulatory Visit: Payer: Self-pay

## 2022-05-20 DIAGNOSIS — I493 Ventricular premature depolarization: Secondary | ICD-10-CM | POA: Diagnosis present

## 2022-05-20 DIAGNOSIS — E782 Mixed hyperlipidemia: Secondary | ICD-10-CM | POA: Insufficient documentation

## 2022-05-20 DIAGNOSIS — Z7902 Long term (current) use of antithrombotics/antiplatelets: Secondary | ICD-10-CM | POA: Insufficient documentation

## 2022-05-20 DIAGNOSIS — I2510 Atherosclerotic heart disease of native coronary artery without angina pectoris: Secondary | ICD-10-CM | POA: Diagnosis not present

## 2022-05-20 DIAGNOSIS — Z7982 Long term (current) use of aspirin: Secondary | ICD-10-CM | POA: Insufficient documentation

## 2022-05-20 DIAGNOSIS — E7800 Pure hypercholesterolemia, unspecified: Secondary | ICD-10-CM | POA: Diagnosis present

## 2022-05-20 DIAGNOSIS — Z79899 Other long term (current) drug therapy: Secondary | ICD-10-CM | POA: Diagnosis not present

## 2022-05-20 DIAGNOSIS — I700 Atherosclerosis of aorta: Secondary | ICD-10-CM | POA: Diagnosis not present

## 2022-05-20 DIAGNOSIS — Z955 Presence of coronary angioplasty implant and graft: Secondary | ICD-10-CM | POA: Insufficient documentation

## 2022-05-20 DIAGNOSIS — I25119 Atherosclerotic heart disease of native coronary artery with unspecified angina pectoris: Secondary | ICD-10-CM | POA: Diagnosis present

## 2022-05-20 DIAGNOSIS — I10 Essential (primary) hypertension: Secondary | ICD-10-CM | POA: Diagnosis present

## 2022-05-20 DIAGNOSIS — R0602 Shortness of breath: Secondary | ICD-10-CM | POA: Diagnosis present

## 2022-05-20 DIAGNOSIS — I200 Unstable angina: Secondary | ICD-10-CM

## 2022-05-20 HISTORY — PX: CORONARY STENT INTERVENTION: CATH118234

## 2022-05-20 HISTORY — PX: LEFT HEART CATH AND CORONARY ANGIOGRAPHY: CATH118249

## 2022-05-20 LAB — POCT ACTIVATED CLOTTING TIME
Activated Clotting Time: 257 seconds
Activated Clotting Time: 281 seconds
Activated Clotting Time: 281 seconds

## 2022-05-20 LAB — TROPONIN I (HIGH SENSITIVITY): Troponin I (High Sensitivity): 585 ng/L (ref <18)

## 2022-05-20 SURGERY — LEFT HEART CATH AND CORONARY ANGIOGRAPHY
Anesthesia: LOCAL

## 2022-05-20 MED ORDER — NITROGLYCERIN 0.4 MG SL SUBL
0.4000 mg | SUBLINGUAL_TABLET | SUBLINGUAL | Status: DC | PRN
  Administered 2022-05-20 (×2): 0.4 mg via SUBLINGUAL
  Filled 2022-05-20: qty 1

## 2022-05-20 MED ORDER — HEPARIN SODIUM (PORCINE) 1000 UNIT/ML IJ SOLN
INTRAMUSCULAR | Status: AC
  Filled 2022-05-20: qty 10

## 2022-05-20 MED ORDER — ACETAMINOPHEN 325 MG PO TABS
650.0000 mg | ORAL_TABLET | ORAL | Status: DC | PRN
  Administered 2022-05-20 – 2022-05-21 (×2): 650 mg via ORAL
  Filled 2022-05-20 (×2): qty 2

## 2022-05-20 MED ORDER — HYDRALAZINE HCL 20 MG/ML IJ SOLN
INTRAMUSCULAR | Status: AC
  Filled 2022-05-20: qty 1

## 2022-05-20 MED ORDER — SODIUM CHLORIDE 0.9 % IV SOLN: 250.0000 mL | INTRAVENOUS | Status: DC | PRN

## 2022-05-20 MED ORDER — LOPERAMIDE HCL 2 MG PO CAPS: 4.0000 mg | ORAL_CAPSULE | ORAL | Status: DC | PRN

## 2022-05-20 MED ORDER — VITAMIN B-12 100 MCG PO TABS
500.0000 ug | ORAL_TABLET | Freq: Every day | ORAL | Status: DC
  Administered 2022-05-21 – 2022-05-22 (×2): 500 ug via ORAL
  Filled 2022-05-20 (×3): qty 5

## 2022-05-20 MED ORDER — SODIUM CHLORIDE 0.9 % WEIGHT BASED INFUSION
3.0000 mL/kg/h | INTRAVENOUS | Status: DC
  Administered 2022-05-20: 3 mL/kg/h via INTRAVENOUS

## 2022-05-20 MED ORDER — THYROID 60 MG PO TABS
90.0000 mg | ORAL_TABLET | Freq: Every day | ORAL | Status: DC
  Administered 2022-05-21 – 2022-05-22 (×2): 90 mg via ORAL
  Filled 2022-05-20 (×3): qty 1

## 2022-05-20 MED ORDER — SODIUM CHLORIDE 0.9% FLUSH: 3.0000 mL | INTRAVENOUS | Status: DC | PRN

## 2022-05-20 MED ORDER — CLOPIDOGREL BISULFATE 75 MG PO TABS
75.0000 mg | ORAL_TABLET | Freq: Every day | ORAL | Status: DC
  Administered 2022-05-21 – 2022-05-22 (×2): 75 mg via ORAL
  Filled 2022-05-20 (×2): qty 1

## 2022-05-20 MED ORDER — METOPROLOL TARTRATE 25 MG PO TABS
25.0000 mg | ORAL_TABLET | Freq: Three times a day (TID) | ORAL | Status: DC
  Filled 2022-05-20: qty 1

## 2022-05-20 MED ORDER — NITROGLYCERIN 1 MG/10 ML FOR IR/CATH LAB
INTRA_ARTERIAL | Status: AC
  Filled 2022-05-20: qty 10

## 2022-05-20 MED ORDER — MIDAZOLAM HCL 2 MG/2ML IJ SOLN
INTRAMUSCULAR | Status: AC
  Filled 2022-05-20: qty 2

## 2022-05-20 MED ORDER — HYDRALAZINE HCL 20 MG/ML IJ SOLN: 10.0000 mg | Freq: Once | INTRAMUSCULAR | Status: DC

## 2022-05-20 MED ORDER — LIDOCAINE HCL (PF) 1 % IJ SOLN
INTRAMUSCULAR | Status: AC
  Filled 2022-05-20: qty 30

## 2022-05-20 MED ORDER — NITROGLYCERIN IN D5W 200-5 MCG/ML-% IV SOLN: 0.0000 ug/min | INTRAVENOUS | Status: DC

## 2022-05-20 MED ORDER — ROSUVASTATIN CALCIUM 5 MG PO TABS
5.0000 mg | ORAL_TABLET | Freq: Every day | ORAL | Status: DC
  Filled 2022-05-20 (×2): qty 1

## 2022-05-20 MED ORDER — FENTANYL CITRATE (PF) 100 MCG/2ML IJ SOLN
INTRAMUSCULAR | Status: DC | PRN
  Administered 2022-05-20 (×4): 25 ug via INTRAVENOUS
  Administered 2022-05-20: 50 ug via INTRAVENOUS

## 2022-05-20 MED ORDER — HEPARIN (PORCINE) IN NACL 1000-0.9 UT/500ML-% IV SOLN
INTRAVENOUS | Status: DC | PRN
  Administered 2022-05-20 (×2): 500 mL

## 2022-05-20 MED ORDER — DIAZEPAM 5 MG PO TABS
ORAL_TABLET | ORAL | Status: AC
  Filled 2022-05-20: qty 1

## 2022-05-20 MED ORDER — IOHEXOL 350 MG/ML SOLN
INTRAVENOUS | Status: DC | PRN
  Administered 2022-05-20: 160 mL

## 2022-05-20 MED ORDER — HEPARIN SODIUM (PORCINE) 1000 UNIT/ML IJ SOLN
INTRAMUSCULAR | Status: DC | PRN
  Administered 2022-05-20: 3000 [IU] via INTRAVENOUS
  Administered 2022-05-20: 4000 [IU] via INTRAVENOUS
  Administered 2022-05-20: 2000 [IU] via INTRAVENOUS
  Administered 2022-05-20: 6000 [IU] via INTRAVENOUS

## 2022-05-20 MED ORDER — MIDAZOLAM HCL 2 MG/2ML IJ SOLN
INTRAMUSCULAR | Status: DC | PRN
  Administered 2022-05-20 (×4): 1 mg via INTRAVENOUS

## 2022-05-20 MED ORDER — CLOPIDOGREL BISULFATE 75 MG PO TABS
75.0000 mg | ORAL_TABLET | Freq: Every day | ORAL | 11 refills | Status: DC
  Filled 2022-05-20: qty 30, 30d supply, fill #0

## 2022-05-20 MED ORDER — FAMOTIDINE IN NACL 20-0.9 MG/50ML-% IV SOLN
INTRAVENOUS | Status: AC | PRN
  Administered 2022-05-20: 20 mg via INTRAVENOUS

## 2022-05-20 MED ORDER — ONDANSETRON HCL 4 MG/2ML IJ SOLN
4.0000 mg | Freq: Four times a day (QID) | INTRAMUSCULAR | Status: DC | PRN
  Filled 2022-05-20: qty 2

## 2022-05-20 MED ORDER — ASPIRIN 81 MG PO CHEW: 81.0000 mg | CHEWABLE_TABLET | ORAL | Status: DC

## 2022-05-20 MED ORDER — FAMOTIDINE IN NACL 20-0.9 MG/50ML-% IV SOLN
INTRAVENOUS | Status: AC
  Filled 2022-05-20: qty 50

## 2022-05-20 MED ORDER — COENZYME Q10 100 MG PO CAPS: 100.0000 mg | ORAL_CAPSULE | Freq: Every day | ORAL | Status: DC

## 2022-05-20 MED ORDER — SODIUM CHLORIDE 0.9% FLUSH: 3.0000 mL | Freq: Two times a day (BID) | INTRAVENOUS | Status: DC

## 2022-05-20 MED ORDER — TADALAFIL 5 MG PO TABS: 10.0000 mg | ORAL_TABLET | Freq: Every day | ORAL | Status: DC | PRN

## 2022-05-20 MED ORDER — SODIUM CHLORIDE 0.9 % WEIGHT BASED INFUSION: 1.0000 mL/kg/h | INTRAVENOUS | Status: DC

## 2022-05-20 MED ORDER — KRILL OIL 500 MG PO CAPS: 1.0000 | ORAL_CAPSULE | ORAL | Status: DC

## 2022-05-20 MED ORDER — OXYCODONE HCL 5 MG PO TABS: 5.0000 mg | ORAL_TABLET | ORAL | Status: DC | PRN

## 2022-05-20 MED ORDER — HYDRALAZINE HCL 20 MG/ML IJ SOLN: 10.0000 mg | INTRAMUSCULAR | Status: DC | PRN

## 2022-05-20 MED ORDER — VERAPAMIL HCL 2.5 MG/ML IV SOLN
INTRAVENOUS | Status: AC
  Filled 2022-05-20: qty 2

## 2022-05-20 MED ORDER — SILDENAFIL CITRATE 50 MG PO TABS: 25.0000 mg | ORAL_TABLET | Freq: Every day | ORAL | Status: DC | PRN

## 2022-05-20 MED ORDER — DIAZEPAM 5 MG PO TABS
5.0000 mg | ORAL_TABLET | ORAL | Status: DC | PRN
  Administered 2022-05-20 (×2): 5 mg via ORAL
  Filled 2022-05-20: qty 1

## 2022-05-20 MED ORDER — LIDOCAINE HCL (PF) 1 % IJ SOLN
INTRAMUSCULAR | Status: DC | PRN
  Administered 2022-05-20: 5 mL

## 2022-05-20 MED ORDER — EVOLOCUMAB 140 MG/ML ~~LOC~~ SOAJ: 140.0000 mg | SUBCUTANEOUS | Status: DC

## 2022-05-20 MED ORDER — SODIUM CHLORIDE 0.9 % IV SOLN: INTRAVENOUS | Status: AC

## 2022-05-20 MED ORDER — NITROGLYCERIN 1 MG/10 ML FOR IR/CATH LAB
INTRA_ARTERIAL | Status: DC | PRN
  Administered 2022-05-20 (×2): 100 ug via INTRACORONARY

## 2022-05-20 MED ORDER — NEBIVOLOL HCL 10 MG PO TABS: 10.0000 mg | ORAL_TABLET | Freq: Every day | ORAL | Status: DC

## 2022-05-20 MED ORDER — CLOPIDOGREL BISULFATE 300 MG PO TABS
ORAL_TABLET | ORAL | Status: DC | PRN
  Administered 2022-05-20: 600 mg via ORAL

## 2022-05-20 MED ORDER — LOPERAMIDE HCL 2 MG PO TABS
4.0000 mg | ORAL_TABLET | Freq: Every day | ORAL | Status: DC
  Filled 2022-05-20: qty 2

## 2022-05-20 MED ORDER — HEPARIN (PORCINE) IN NACL 1000-0.9 UT/500ML-% IV SOLN
INTRAVENOUS | Status: AC
  Filled 2022-05-20: qty 500

## 2022-05-20 MED ORDER — LABETALOL HCL 5 MG/ML IV SOLN: 10.0000 mg | INTRAVENOUS | Status: AC | PRN

## 2022-05-20 MED ORDER — ASPIRIN 81 MG PO CHEW: 81.0000 mg | CHEWABLE_TABLET | Freq: Every day | ORAL | Status: DC

## 2022-05-20 MED ORDER — CLOPIDOGREL BISULFATE 300 MG PO TABS
ORAL_TABLET | ORAL | Status: AC
  Filled 2022-05-20: qty 2

## 2022-05-20 MED ORDER — FENTANYL CITRATE (PF) 100 MCG/2ML IJ SOLN
INTRAMUSCULAR | Status: AC
  Filled 2022-05-20: qty 2

## 2022-05-20 MED ORDER — VITAMIN D 25 MCG (1000 UNIT) PO TABS
5000.0000 [IU] | ORAL_TABLET | Freq: Every day | ORAL | Status: DC
  Administered 2022-05-20 – 2022-05-22 (×3): 5000 [IU] via ORAL
  Filled 2022-05-20 (×4): qty 5

## 2022-05-20 MED ORDER — SODIUM CHLORIDE 0.9% FLUSH
3.0000 mL | Freq: Two times a day (BID) | INTRAVENOUS | Status: DC
  Administered 2022-05-20 – 2022-05-21 (×3): 3 mL via INTRAVENOUS

## 2022-05-20 MED ORDER — VITAMIN E 180 MG (400 UNIT) PO CAPS
400.0000 [IU] | ORAL_CAPSULE | Freq: Every day | ORAL | Status: DC
  Administered 2022-05-20 – 2022-05-22 (×3): 400 [IU] via ORAL
  Filled 2022-05-20 (×4): qty 1

## 2022-05-20 MED ORDER — ASPIRIN 81 MG PO TBEC
81.0000 mg | DELAYED_RELEASE_TABLET | Freq: Every day | ORAL | Status: DC
  Administered 2022-05-21 – 2022-05-22 (×2): 81 mg via ORAL
  Filled 2022-05-20 (×2): qty 1

## 2022-05-20 MED ORDER — VERAPAMIL HCL 2.5 MG/ML IV SOLN
INTRAVENOUS | Status: DC | PRN
  Administered 2022-05-20: 10 mL via INTRA_ARTERIAL

## 2022-05-20 SURGICAL SUPPLY — 15 items
BALLN SAPPHIRE 2.5X20 (BALLOONS) ×1
BALLN ~~LOC~~ EMERGE MR 4.0X20 (BALLOONS) ×1
BAND ZEPHYR COMPRESS 30 LONG (HEMOSTASIS) ×1
CATH 5FR JL3.5 JR4 ANG PIG MP (CATHETERS) ×1
CATH VISTA GUIDE 6FR AL1 (CATHETERS) ×1
GLIDESHEATH SLEND A-KIT 6F 22G (SHEATH) ×1
INQWIRE 1.5J .035X260CM (WIRE) ×1
KIT ENCORE 26 ADVANTAGE (KITS) ×1
KIT HEART LEFT (KITS) ×1
PACK CARDIAC CATHETERIZATION (CUSTOM PROCEDURE TRAY) ×1
SHEATH PROBE COVER 6X72 (BAG) ×1
SYNERGY XD 3.50X24 (Permanent Stent) ×1 IMPLANT
TRANSDUCER W/STOPCOCK (MISCELLANEOUS) ×1
TUBING CIL FLEX 10 FLL-RA (TUBING) ×1
WIRE ASAHI PROWATER 180CM (WIRE) ×1

## 2022-05-20 NOTE — Progress Notes (Signed)
Patient describes a migraine aura in his vision, wavy lines from the left side.  Patient has no loss of grip strength. Foot flexion and extension is equal and strong. Facial smile is symmetrical.   Dr. Tamala Julian in to see patient.   Patient had to stand to void. 700 cc output.  Diastolic pressure now down to 32mH from 1127mg prior to voiding.

## 2022-05-20 NOTE — Progress Notes (Addendum)
  Sean Mckay : Continuing to c/o of worsening Chest Pressure,headache and lightheadedness. Requesting for MD to re-assess .  Cardiology to see pt. Pt refusing all scheduled meds at this time.  [name] starting to c/o of Left upper chest pain similar to what he had at cath lab per pt. BP currently at 161/110. Had been trending higher. See flow sheet. Earlier after he had been to the admitted on the floor he had similar episode also with nausea and had vomited and had a bad headache. This has self resolved . Cecilie Kicks NP Updated via [facility] page. Awaiting for response.

## 2022-05-20 NOTE — Progress Notes (Signed)
Critical lab value: Troponin = 585  Sent page to  Dr. Humphrey Rolls, [facility]

## 2022-05-20 NOTE — Plan of Care (Addendum)
Nurse notified patient requesting to be seen by physician for persistent chest pain  Patient has been having persistent chest pain radiating to his L arm and throat, intensity came down from 9/10 in AM to 4/10 when I saw him this evening. Similar to index pain. SL NTG 0.'4mg'$  x 2 were administered with no relief and even aggravated his chest pain. It brought him headaches. Will obtain EKG, Hs troponins and Metoprolol tartarate Q8h. Patient agreed to the plan.  [place] Fidel Levy, MD Cardiology

## 2022-05-20 NOTE — CV Procedure (Signed)
Segmental 80% proximal RCA treated with a 3.5 x 24 stent postdilated to 4.0 mm in diameter at high-pressure x2. Post procedure the patient complained of left shoulder discomfort left face and neck discomfort similar to pain that he has had as an outpatient. Postprocedure EKG is nonischemic. Plan outpatient PCI status but if continues to hurt may need to be admitted to ensure safety.

## 2022-05-20 NOTE — Progress Notes (Signed)
Patient states migraine aura has resolved, no visually aura present.

## 2022-05-20 NOTE — Interval H&P Note (Signed)
Cath Lab Visit (complete for each Cath Lab visit)  Clinical Evaluation Leading to the Procedure:   ACS: No.  Non-ACS:    Anginal Classification: CCS III  Anti-ischemic medical therapy: No Therapy  Non-Invasive Test Results: High-risk stress test findings: cardiac mortality >3%/year  Prior CABG: No previous CABG      History and Physical Interval Note:  05/20/2022 8:39 AM  Sean Mckay  has presented today for surgery, with the diagnosis of cad - angina.  The various methods of treatment have been discussed with the patient and family. After consideration of risks, benefits and other options for treatment, the patient has consented to  Procedure(s): LEFT HEART CATH AND CORONARY ANGIOGRAPHY (N/A) CORONARY STENT INTERVENTION (N/A) as a surgical intervention.  The patient's history has been reviewed, patient examined, no change in status, stable for surgery.  I have reviewed the patient's chart and labs.  Questions were answered to the patient's satisfaction.     [name]

## 2022-05-20 NOTE — Progress Notes (Signed)
  Pt has return of chest pain, will try sl NTG and his BP is elevated as well. Will give a dose of hydralazine for BP - in past he had muscle pain but this is one or two time dose not for discharge.   He is staying overnight with episodes of pain.  Considered labetalol but with SB would be hesitant to use.   Cecilie Kicks, FNP-C [facility]  [phone] or after 5pm and on weekends call [phone] 05/20/2022.now

## 2022-05-21 ENCOUNTER — Encounter (HOSPITAL_COMMUNITY): Payer: Self-pay | Admitting: Interventional Cardiology

## 2022-05-21 DIAGNOSIS — I493 Ventricular premature depolarization: Secondary | ICD-10-CM | POA: Diagnosis not present

## 2022-05-21 DIAGNOSIS — I2510 Atherosclerotic heart disease of native coronary artery without angina pectoris: Secondary | ICD-10-CM | POA: Diagnosis not present

## 2022-05-21 DIAGNOSIS — I25119 Atherosclerotic heart disease of native coronary artery with unspecified angina pectoris: Secondary | ICD-10-CM | POA: Diagnosis not present

## 2022-05-21 DIAGNOSIS — I10 Essential (primary) hypertension: Secondary | ICD-10-CM | POA: Diagnosis not present

## 2022-05-21 DIAGNOSIS — E782 Mixed hyperlipidemia: Secondary | ICD-10-CM | POA: Diagnosis not present

## 2022-05-21 LAB — CBC
HCT: 40.8 % (ref 39.0–52.0)
Hemoglobin: 13.7 g/dL (ref 13.0–17.0)
MCH: 28.4 pg (ref 26.0–34.0)
MCHC: 33.6 g/dL (ref 30.0–36.0)
MCV: 84.5 fL (ref 80.0–100.0)
Platelets: 267 10*3/uL (ref 150–400)
RBC: 4.83 MIL/uL (ref 4.22–5.81)
RDW: 12.9 % (ref 11.5–15.5)
WBC: 8.6 10*3/uL (ref 4.0–10.5)
nRBC: 0 % (ref 0.0–0.2)

## 2022-05-21 LAB — BASIC METABOLIC PANEL
Anion gap: 11 (ref 5–15)
BUN: 9 mg/dL (ref 6–20)
CO2: 22 mmol/L (ref 22–32)
Calcium: 8.7 mg/dL — ABNORMAL LOW (ref 8.9–10.3)
Chloride: 106 mmol/L (ref 98–111)
Creatinine, Ser: 0.94 mg/dL (ref 0.61–1.24)
GFR, Estimated: >60 mL/min (ref >60)
Glucose, Bld: 110 mg/dL — ABNORMAL HIGH (ref 70–99)
Potassium: 3.8 mmol/L (ref 3.5–5.1)
Sodium: 139 mmol/L (ref 135–145)

## 2022-05-21 LAB — TROPONIN I (HIGH SENSITIVITY)
Troponin I (High Sensitivity): 1113 ng/L (ref <18)
Troponin I (High Sensitivity): 1137 ng/L (ref <18)

## 2022-05-21 MED ORDER — ISOSORBIDE MONONITRATE ER 30 MG PO TB24
15.0000 mg | ORAL_TABLET | Freq: Every day | ORAL | Status: DC
  Administered 2022-05-21 – 2022-05-22 (×2): 15 mg via ORAL
  Filled 2022-05-21 (×2): qty 1

## 2022-05-21 MED ORDER — NEBIVOLOL HCL 10 MG PO TABS
10.0000 mg | ORAL_TABLET | Freq: Every day | ORAL | Status: DC
  Administered 2022-05-21 – 2022-05-22 (×2): 10 mg via ORAL
  Filled 2022-05-21 (×2): qty 1

## 2022-05-21 MED ORDER — LOPERAMIDE HCL 2 MG PO CAPS
4.0000 mg | ORAL_CAPSULE | Freq: Every day | ORAL | Status: DC
  Administered 2022-05-21: 4 mg via ORAL
  Filled 2022-05-21 (×2): qty 2

## 2022-05-21 MED ORDER — ROSUVASTATIN CALCIUM 5 MG PO TABS
5.0000 mg | ORAL_TABLET | ORAL | Status: DC
  Administered 2022-05-21: 5 mg via ORAL
  Filled 2022-05-21: qty 1

## 2022-05-21 MED ORDER — IRBESARTAN 150 MG PO TABS
150.0000 mg | ORAL_TABLET | Freq: Every day | ORAL | Status: DC
  Administered 2022-05-21 – 2022-05-22 (×2): 150 mg via ORAL
  Filled 2022-05-21 (×2): qty 1

## 2022-05-21 NOTE — Progress Notes (Addendum)
Rounding Note    Patient Name: Sean Mckay Date of Encounter: 05/21/2022  [place] Cardiologist: Skeet Latch, MD   Subjective   Patient sitting up in bed on my exam this morning. He is anxious/stressed appearing with ongoing chest pain. Patient states that immediately following LHC yesterday, he began to experience left side chest pain, radiating through to his back. This pain also radiates up to his neck. Pain is described as sharp and cramping. Currently rated 2/10 but overnight was more severe at times. Patient was given nitroglycerin yesterday and says that this actually made his pain worse, resulting in more diffuse, substernal discomfort. Additionally, patient has noticed intermittent sensation of dizziness without clear provocation. He is understandably concerned about ongoing chest pain given significant family cardiac history.   Inpatient Medications    Scheduled Meds:  aspirin EC  81 mg Oral Daily   cholecalciferol  5,000 Units Oral Daily   clopidogrel  75 mg Oral Q breakfast   nebivolol  10 mg Oral Daily   rosuvastatin  5 mg Oral Daily   sodium chloride flush  3 mL Intravenous Q12H   thyroid  90 mg Oral Daily   cyanocobalamin  500 mcg Oral Daily   vitamin E  400 Units Oral Daily   Continuous Infusions:  sodium chloride     PRN Meds: sodium chloride, acetaminophen, diazepam, loperamide, nitroGLYCERIN, ondansetron (ZOFRAN) IV, oxyCODONE, sodium chloride flush   Vital Signs    Vitals:   05/20/22 1810 05/20/22 1825 05/20/22 1900 05/21/22 0447  BP: (!) 161/110 (!) 167/102 128/86 (!) 128/95  Pulse:   63 63  Resp:    18  Temp:   98.7 F (37.1 C) 97.9 F (36.6 C)  TempSrc:   Oral Oral  SpO2:    95%  Weight:      Height:        Intake/Output Summary (Last 24 hours) at 05/21/2022 0831 Last data filed at 05/20/2022 2213 Gross per 24 hour  Intake 240 ml  Output 700 ml  Net -460 ml      05/20/2022   10:14 AM 05/20/2022    6:12 AM  05/18/2022   10:49 PM  Last 3 Weights  Weight (lbs) 199 lb 180 lb 180 lb  Weight (kg) 90.266 kg 81.647 kg 81.647 kg      Telemetry    Sinus rhythm - Personally Reviewed  ECG    Sinus rhythm without acute ischemic changes - Personally Reviewed  Physical Exam   GEN: Anxious/stressed/uncomfortable appearing   Neck: No JVD Cardiac: RRR, no murmurs, rubs, or gallops.  Respiratory: Clear to auscultation bilaterally. GI: Soft, nontender, non-distended  MS: No edema; No deformity. Right radial artery access site with tegaderm/gauze dressing. Appears that a small hematoma was marked for monitoring. This area is now soft/non-tender and there is no spreading. Neuro:  Nonfocal  Psych: Normal affect   Labs    High Sensitivity Troponin:   Recent Labs  Lab 05/18/22 2303 05/19/22 0053 05/20/22 2102 05/21/22 0126  TROPONINIHS 3 3 585* 1,113*     Chemistry Recent Labs  Lab 05/14/22 1020 05/18/22 2303 05/21/22 0126  NA 142 139 139  K 4.2 3.6 3.8  CL 103 103 106  CO2 '25 28 22  '$ GLUCOSE 91 101* 110*  BUN '10 10 9  '$ CREATININE 0.89 0.83 0.94  CALCIUM 9.1 9.5 8.7*  PROT  --  7.7  --   ALBUMIN  --  4.4  --   AST  --  12*  --   ALT  --  14  --   ALKPHOS  --  43  --   BILITOT  --  0.5  --   GFRNONAA  --  >60 >60  ANIONGAP  --  8 11    Lipids No results for input(s): "CHOL", "TRIG", "HDL", "LABVLDL", "LDLCALC", "CHOLHDL" in the last 168 hours.  Hematology Recent Labs  Lab 05/14/22 1020 05/18/22 2303 05/21/22 0126  WBC 6.5 7.0 8.6  RBC 5.56 5.32 4.83  HGB 15.7 14.9 13.7  HCT 47.3 45.5 40.8  MCV 85 85.5 84.5  MCH 28.2 28.0 28.4  MCHC 33.2 32.7 33.6  RDW 13.3 13.0 12.9  PLT 333 323 267   Thyroid No results for input(s): "TSH", "FREET4" in the last 168 hours.  BNPNo results for input(s): "BNP", "PROBNP" in the last 168 hours.  DDimer No results for input(s): "DDIMER" in the last 168 hours.   Radiology    CARDIAC CATHETERIZATION  Result Date: 05/20/2022 CONCLUSIONS:  Segmental 80% proximal to mid RCA stenosis.  Mid to distal irregularities up to 30%. The proximal stenosis was treated with a 24 x 3.5 mm drug-eluting stent postdilated to 4.0 mm in diameter with TIMI grade III flow noted.  0% stenosis was noted. Left main coronary artery is widely patent. The LAD is transapical and contains no significant obstruction. Circumflex contains mid vessel irregularities up to 30 to 40%. LV function is normal with estimated EF 50 to 55%.  EDP 15 mmHg. RECOMMENDATIONS: Clopidogrel and aspirin dual antiplatelet therapy for 6 months.  After 6 months okay to drop aspirin.  Continue clopidogrel indefinitely unless bleeding issues. Aggressive risk factor modification Patient having discomfort post procedure without objective evidence of ischemia on EKG, coronary angiography, and therefore will be admitted to the hospital for overnight stay.    Cardiac Studies   05/20/2022 LHC  CONCLUSIONS: Segmental 80% proximal to mid RCA stenosis.  Mid to distal irregularities up to 30%. The proximal stenosis was treated with a 24 x 3.5 mm drug-eluting stent postdilated to 4.0 mm in diameter with TIMI grade III flow noted.  0% stenosis was noted. Left main coronary artery is widely patent. The LAD is transapical and contains no significant obstruction. Circumflex contains mid vessel irregularities up to 30 to 40%. LV function is normal with estimated EF 50 to 55%.  EDP 15 mmHg.   RECOMMENDATIONS:   Clopidogrel and aspirin dual antiplatelet therapy for 6 months.  After 6 months okay to drop aspirin.  Continue clopidogrel indefinitely unless bleeding issues. Aggressive risk factor modification Patient having discomfort post procedure without objective evidence of ischemia on EKG, coronary angiography, and therefore will be admitted to the hospital for overnight stay.  Wall Motion              Left Heart  Left Ventricle The left ventricular size is normal. LV end diastolic pressure  is normal. The left ventricular ejection fraction is 55-65% by visual estimate.   Coronary Diagrams  Diagnostic Dominance: Right  Intervention    Patient Profile     Sean Mckay is a 56 y.o. male with a hx of HTN, HLD, palpitations, CAD, aortic atherosclerosis, here for scheduled outpatient PCI. Patient presented to the ED on 11/5 with chest pressure radiating to his left arm and jaw. These symptoms had been ongoing following nuclear med stress test. Patient also had a cardiac CTA on 10/31 which showed severe mixed plaque stenosis in proximal RCA and was scheduled for  outpatient LHC on 11/07.   Assessment & Plan    CAD s/p stent to proximal RCA Aortic atherosclerosis Chest pain/pressure  Patient with ongoing left side chest pain with radiation to upper back/neck/jaw and simultaneous dyspnea following LHC with PCI to RCA on 11/07. Pain is not positional or exertionally associated. ECG is without ischemic change and pain was actually made worse with administration of nitro. Troponin checked overnight following cath, 585, 1113. ECG is reassuring with NSR and no focal ischemic changes.  Patient's ongoing chest discomfort is without clear etiology. Will continue to trend troponin following LHC. If pain continues/worsens, would have to consider the possibility of re-stenosis although this would be very unlikely.  Patient unlikely to benefit from long-acting nitrate as pain was not modified with administration of short acting. Continue GDMT including ASA/Plavix, Bystolic, Rosuvastatin Continue Repatha upon discharge  Hypertension  Patient with BP elevated into the 149F systolic this admission. Per patient, BP has been poorly controlled since his nuc med stress test several weeks ago despite no change in med regimen.  Continue Bystolic '10mg'$ . Unable to titrate up as patient reports dyspnea with higher doses Resume home ARB (Valsartan, will given formulary equivalent). May need to  up-titrate. Could consider long acting nitrate for additional BP management/anti-anginal therapy though short acting nitroglycerin seemed to provoke patient's chest pain.  Hyperlipidemia  Continue Rosuvastatin '5mg'$  3 times per week. Continue PCSK9 therapy.   For questions or updates, please contact [place] Please consult [email or web address] for contact info under        Signed, Lily Kocher, PA-C  05/21/2022, 8:31 AM    Patient seen and examined with Lily Kocher PA-C.  Agree as above, with the following exceptions and changes as noted below. Still experiencing 2 out of 10 left-sided chest discomfort with left shoulder pain.  Unable to take Norvasc and prefers nebivolol without change in beta-blockade.  Has not yet tried isosorbide, we discussed initiation of very low-dose and monitoring in hospital.  He did not respond well to nitroglycerin with reported increase in chest pain and headache.  Recommend Tylenol and caffeine for headache with Imdur.  Gen: NAD, CV: RRR, no murmurs, Lungs: clear, Abd: soft, Extrem: Warm, well perfused, no edema, Neuro/Psych: alert and oriented x 3, normal mood and affect. All available labs, radiology testing, previous records reviewed.  Radial cath site is normal with 4/4 pulse.  Continue current medical therapy as noted above.  Patient is concerned about elevated blood pressure since nuclear stress test, we discussed physiology of regadenoson and nuclear isotope.  Unclear why blood pressure has been elevated but will treat as needed.  Cath films reviewed, no concerning features.  - imdur 15 mg daily start today.    Elouise Munroe, MD 05/21/22 11:06 AM

## 2022-05-21 NOTE — Progress Notes (Signed)
Ambulation deferred due to pt having typical angina. Pt was informed about CRPII, pt is interested and will be referred to [facility].   Christen Bame, MS EP 05/21/2022 11:30 AM

## 2022-05-22 ENCOUNTER — Other Ambulatory Visit: Payer: Self-pay

## 2022-05-22 ENCOUNTER — Encounter (HOSPITAL_COMMUNITY): Payer: Self-pay | Admitting: Interventional Cardiology

## 2022-05-22 ENCOUNTER — Other Ambulatory Visit (HOSPITAL_COMMUNITY): Payer: Self-pay

## 2022-05-22 DIAGNOSIS — I10 Essential (primary) hypertension: Secondary | ICD-10-CM | POA: Diagnosis not present

## 2022-05-22 DIAGNOSIS — I493 Ventricular premature depolarization: Secondary | ICD-10-CM | POA: Diagnosis not present

## 2022-05-22 DIAGNOSIS — I2510 Atherosclerotic heart disease of native coronary artery without angina pectoris: Secondary | ICD-10-CM | POA: Diagnosis not present

## 2022-05-22 DIAGNOSIS — E782 Mixed hyperlipidemia: Secondary | ICD-10-CM | POA: Diagnosis not present

## 2022-05-22 DIAGNOSIS — I25119 Atherosclerotic heart disease of native coronary artery with unspecified angina pectoris: Secondary | ICD-10-CM | POA: Diagnosis not present

## 2022-05-22 LAB — BASIC METABOLIC PANEL
Anion gap: 7 (ref 5–15)
BUN: 9 mg/dL (ref 6–20)
CO2: 26 mmol/L (ref 22–32)
Calcium: 9.3 mg/dL (ref 8.9–10.3)
Chloride: 105 mmol/L (ref 98–111)
Creatinine, Ser: 0.95 mg/dL (ref 0.61–1.24)
GFR, Estimated: >60 mL/min (ref >60)
Glucose, Bld: 136 mg/dL — ABNORMAL HIGH (ref 70–99)
Potassium: 3.9 mmol/L (ref 3.5–5.1)
Sodium: 138 mmol/L (ref 135–145)

## 2022-05-22 LAB — CBC
HCT: 42.8 % (ref 39.0–52.0)
Hemoglobin: 14.3 g/dL (ref 13.0–17.0)
MCH: 28.3 pg (ref 26.0–34.0)
MCHC: 33.4 g/dL (ref 30.0–36.0)
MCV: 84.8 fL (ref 80.0–100.0)
Platelets: 287 10*3/uL (ref 150–400)
RBC: 5.05 MIL/uL (ref 4.22–5.81)
RDW: 12.9 % (ref 11.5–15.5)
WBC: 9.7 10*3/uL (ref 4.0–10.5)
nRBC: 0 % (ref 0.0–0.2)

## 2022-05-22 LAB — LIPOPROTEIN A (LPA): Lipoprotein (a): 195.3 nmol/L — ABNORMAL HIGH (ref <75.0)

## 2022-05-22 LAB — MAGNESIUM: Magnesium: 1.8 mg/dL (ref 1.7–2.4)

## 2022-05-22 MED ORDER — ISOSORBIDE MONONITRATE ER 30 MG PO TB24
15.0000 mg | ORAL_TABLET | Freq: Every day | ORAL | 2 refills | Status: DC
  Filled 2022-05-22: qty 15, 30d supply, fill #0

## 2022-05-22 NOTE — Progress Notes (Signed)
0320: nurse tech informed this nurse, pt states his alarm has been going off intermittently for hours and wants to know  why and if something is wrong to cause the alarm. Told pt that his rhythm has been normal without any ectopies according to central telemetry monitoring with normal heart rate. Told pt to call this nurse if alarms go off to assess reason of alarm. Pt does have light St-elevation which not anything new. Pt feels something is wrong with his heart and will like additional further explanation. Pt refused an EkG, stated he is not having any CP or SOB. Stated valium taken previously did not help. This nurse called charge nurse to pt room for further explanation.

## 2022-05-22 NOTE — Progress Notes (Signed)
CARDIAC REHAB PHASE I   PRE:  Rate/Rhythm: 68 NSR  BP:  Sitting: N/A      SaO2: 99 RA  MODE:  Ambulation: 470 ft   POST:  Rate/Rhythm: 75 NSR  BP:  Sitting: 102/85     SaO2: 99 RA  Pt ambulated the hallway with standby assistance w/o AD. Pt reported that his SOB and CP were stable and did not increase with activity, RPE 6. Pt was returned to room and educated on stent card, stent location, asa and plavix, wt restriction, no bath/daily wash-ups, S/S of infection, ex guidelines (progressive walking), S/S to stop exercising, ntg use and calling 911, heart healthy diet (pt is vegan), and CRPII. Pt requested to be referred to [facility].   Christen Bame, MS EP 1:27 PM 05/22/2022

## 2022-05-22 NOTE — Discharge Summary (Addendum)
Discharge Summary    Patient ID: APRIL CARLYON MRN: [id number]; DOB: [birth year]  Admit date: 05/20/2022 Discharge date: 05/22/2022  PCP:  Hayden Rasmussen, MD   [place] Providers Cardiologist:  Skeet Latch, MD  Cardiology APP:  Loel Dubonnet, NP       Discharge Diagnoses    Principal Problem:   CAD in native artery Active Problems:   Essential hypertension   SOB (shortness of breath)   PVC's (premature ventricular contractions)   Pure hypercholesterolemia   Chest pain due to CAD [facility])    Diagnostic Studies/Procedures    05/20/2022 LHC  CONCLUSIONS: Segmental 80% proximal to mid RCA stenosis.  Mid to distal irregularities up to 30%. The proximal stenosis was treated with a 24 x 3.5 mm drug-eluting stent postdilated to 4.0 mm in diameter with TIMI grade III flow noted.  0% stenosis was noted. Left main coronary artery is widely patent. The LAD is transapical and contains no significant obstruction. Circumflex contains mid vessel irregularities up to 30 to 40%. LV function is normal with estimated EF 50 to 55%.  EDP 15 mmHg.   RECOMMENDATIONS:   Clopidogrel and aspirin dual antiplatelet therapy for 6 months.  After 6 months okay to drop aspirin.  Continue clopidogrel indefinitely unless bleeding issues. Aggressive risk factor modification Patient having discomfort post procedure without objective evidence of ischemia on EKG, coronary angiography, and therefore will be admitted to the hospital for overnight stay.  [place]              Left Heart  Left Ventricle The left ventricular size is normal. LV end diastolic pressure is normal. The left ventricular ejection fraction is 55-65% by visual estimate.   Coronary Diagrams  Diagnostic Dominance: Right  Intervention   _____________   History of Present Illness     JEREMIE GIANGRANDE is a 56 y.o. male with a hx of HTN, HLD, palpitations, CAD, aortic atherosclerosis, here for  scheduled outpatient PCI. Patient presented to the ED on 11/5 with chest pressure radiating to his left arm and jaw. These symptoms had been ongoing following nuclear med stress test. Patient also had a cardiac CTA on 10/31 which showed severe mixed plaque stenosis in proximal RCA and was scheduled for outpatient LHC on 11/07. Patient presented to the ED on 11/6 secondary to chest discomfort/hypertension but ultimately discharged home given plans for cath the next day.  Hospital Course     Patient arrived in fasting condition for LHC on 11/07, underwent this procedure with Dr. Tamala Julian with findings as below.  CONCLUSIONS: Segmental 80% proximal to mid RCA stenosis.  Mid to distal irregularities up to 30%. The proximal stenosis was treated with a 24 x 3.5 mm drug-eluting stent postdilated to 4.0 mm in diameter with TIMI grade III flow noted.  0% stenosis was noted. Left main coronary artery is widely patent. The LAD is transapical and contains no significant obstruction. Circumflex contains mid vessel irregularities up to 30 to 40%. LV function is normal with estimated EF 50 to 55%.  EDP 15 mmHg.  Following LHC, patient had left side chest pain, radiating through his back and up to his neck. Pain described as sharp and cramping. Currently rated 2/10 but overnight was more severe at times. Patient was given nitroglycerin yesterday and says that this actually made his pain worse, resulting in more diffuse, substernal discomfort. Troponins were check, trended to a plateau: 585, 1113, 1137. ECG was without ischemic changes. Ultimately patient  was initiated on long acting nitrate therapy with improvement in symptoms.   On day of discharge, patient is without significant chest discomfort. Labs/ECG remain reassuring. He will discharge on DAPT (ASA/Plavix) x6 months. He will also continue Imdur 64m as initiated this hospitalization. Cialis/Viagra should be held. Otherwise, patient to continue home medications  including Losartan, Bystolic, Repatha, Rosuvastatin.       Did the patient have an acute coronary syndrome (MI, NSTEMI, STEMI, etc) this admission?:  Yes                               AHA/ACC Clinical Performance & Quality Measures: Aspirin prescribed? - Yes ADP Receptor Inhibitor (Plavix/Clopidogrel, Brilinta/Ticagrelor or Effient/Prasugrel) prescribed (includes medically managed patients)? - Yes Beta Blocker prescribed? - Yes High Intensity Statin (Lipitor 40-836mor Crestor 20-4061mprescribed? - No - Sensitivity. Receiving PCSK9 EF assessed during THIS hospitalization? - Yes For EF <40%, was ACEI/ARB prescribed? - Not Applicable (EF >/= 40%10%or EF <40%, Aldosterone Antagonist (Spironolactone or Eplerenone) prescribed? - Not Applicable (EF >/= 40%93%ardiac Rehab Phase II ordered (including medically managed patients)? - Yes       The patient has been scheduled for a TOC follow up appointment on 11/14 at 10:55am with CaiLaurann MontanaA message has been sent to the [facility] at the office where the patient should be seen for follow up.  _____________  Discharge Vitals Blood pressure 123/87, pulse 70, temperature 98.1 F (36.7 C), temperature source Oral, resp. rate 20, height _0  (1.803 m), weight 90.3 kg, SpO2 99 %.  Filed Weights   05/20/22 0612 05/20/22 1014  Weight: 81.6 kg 90.3 kg    Physical Exam Vitals reviewed.  Constitutional:      Appearance: Normal appearance.  HENT:     Head: Normocephalic and atraumatic.     Nose: Nose normal.  Eyes:     Pupils: Pupils are equal, round, and reactive to light.  Cardiovascular:     Rate and Rhythm: Normal rate and regular rhythm.  Pulmonary:     Effort: Pulmonary effort is normal.     Breath sounds: Normal breath sounds.  Abdominal:     General: Abdomen is flat.     Palpations: Abdomen is soft.  Musculoskeletal:        General: Normal range of motion.     Cervical back: Normal range of motion and neck  supple.  Skin:    General: Skin is warm and dry.     Capillary Refill: Capillary refill takes less than 2 seconds.     Comments: Right radial access site with gauze/tegaderm. Previously marked hematoma is stable/improving  Neurological:     Mental Status: He is alert and oriented to person, place, and time. Mental status is at baseline.  Psychiatric:        Mood and Affect: Mood normal.        Behavior: Behavior normal.        Thought Content: Thought content normal.        Judgment: Judgment normal.      Labs & Radiologic Studies    CBC Recent Labs    05/21/22 0126 05/22/22 0805  WBC 8.6 9.7  HGB 13.7 14.3  HCT 40.8 42.8  MCV 84.5 84.8  PLT 267 [id number] Metabolic Panel Recent Labs    05/21/22 0126 05/22/22 0805  NA 139 138  K 3.8 3.9  CL 106 105  CO2 22 26  GLUCOSE 110* 136*  BUN 9 9  CREATININE 0.94 0.95  CALCIUM 8.7* 9.3  MG  --  1.8   Liver Function Tests No results for input(s): "AST", "ALT", "ALKPHOS", "BILITOT", "PROT", "ALBUMIN" in the last 72 hours. No results for input(s): "LIPASE", "AMYLASE" in the last 72 hours. High Sensitivity Troponin:   Recent Labs  Lab 05/18/22 2303 05/19/22 0053 05/20/22 2102 05/21/22 0126 05/21/22 1105  TROPONINIHS 3 3 585* 1,113* 1,137*    BNP Invalid input(s): "POCBNP" D-Dimer No results for input(s): "DDIMER" in the last 72 hours. Hemoglobin A1C No results for input(s): "HGBA1C" in the last 72 hours. Fasting Lipid Panel No results for input(s): "CHOL", "HDL", "LDLCALC", "TRIG", "CHOLHDL", "LDLDIRECT" in the last 72 hours. Thyroid Function Tests No results for input(s): "TSH", "T4TOTAL", "T3FREE", "THYROIDAB" in the last 72 hours.  Invalid input(s): "FREET3" _____________  CARDIAC CATHETERIZATION  Result Date: 05/20/2022 CONCLUSIONS: Segmental 80% proximal to mid RCA stenosis.  Mid to distal irregularities up to 30%. The proximal stenosis was treated with a 24 x 3.5 mm drug-eluting stent postdilated to  4.0 mm in diameter with TIMI grade III flow noted.  0% stenosis was noted. Left main coronary artery is widely patent. The LAD is transapical and contains no significant obstruction. Circumflex contains mid vessel irregularities up to 30 to 40%. LV function is normal with estimated EF 50 to 55%.  EDP 15 mmHg. RECOMMENDATIONS: Clopidogrel and aspirin dual antiplatelet therapy for 6 months.  After 6 months okay to drop aspirin.  Continue clopidogrel indefinitely unless bleeding issues. Aggressive risk factor modification Patient having discomfort post procedure without objective evidence of ischemia on EKG, coronary angiography, and therefore will be admitted to the hospital for overnight stay.   DG Chest Port 1 View  Result Date: 05/18/2022 CLINICAL DATA:  [id number] with chest pain. EXAM: PORTABLE CHEST 1 VIEW COMPARISON:  PA Lat chest 01/11/2022 FINDINGS: The heart size and mediastinal contours are within normal limits. Both lungs are clear. The visualized skeletal structures are unremarkable. IMPRESSION: No active disease.  Stable chest. Electronically Signed   By: Telford Nab M.D.   On: 05/18/2022 23:17   CT CORONARY MORPH W/CTA COR W/SCORE W/CA W/CM &/OR WO/CM  Addendum Date: 05/13/2022   ADDENDUM REPORT: 05/13/2022 12:04 EXAM: OVER-READ INTERPRETATION  CT CHEST The following report is an over-read performed by radiologist Dr. Nettie [facility] on 05/13/2022. This over-read does not include interpretation of cardiac or coronary anatomy or pathology. The coronary calcium score/coronary CTA interpretation by the cardiologist is attached. COMPARISON:  January 08, 2021. FINDINGS: Vascular: There are no significant non-cardiac vascular findings. Mediastinum/Nodes: There are no enlarged lymph nodes.The visualized esophagus demonstrates no significant findings. Lungs/Pleura: No focal consolidation, pleural effusion, or pneumothorax. Upper abdomen: No acute abnormality.  Unchanged small  hiatal hernia. Musculoskeletal/Chest wall: No chest wall abnormality. No acute or significant osseous findings. IMPRESSION: 1. Unchanged small hiatal hernia. Electronically Signed   By: Titus Dubin M.D.   On: 05/13/2022 12:04   Result Date: 05/13/2022 CLINICAL DATA:  56 yo male with chest pain EXAM: Cardiac/Coronary CTA TECHNIQUE: A non-contrast, gated CT scan was obtained with axial slices of 3 mm through the heart for calcium scoring. Calcium scoring was performed using the Agatston method. A 120 kV prospective, gated, contrast cardiac scan was obtained. Gantry rotation speed was 250 msecs and collimation was 0.6 mm. Two sublingual nitroglycerin tablets (0.8 mg) were given. The 3D data set was  reconstructed in 5% intervals of the 35-75% of the R-R cycle. Diastolic phases were analyzed on a dedicated workstation using MPR, MIP, and VRT modes. The patient received 95 cc of contrast. FINDINGS: Image quality: Excellent. Noise artifact is: Limited (mis-registration). Coronary Arteries:  Normal coronary origin.  Right dominance. Left main: The left main is a large caliber vessel with a normal take off from the left coronary cusp that trifurcates into a LAD, LCX, and ramus intermedius. There is no plaque or stenosis. Left anterior descending artery: The LAD is patent without evidence of plaque or stenosis. Ramus intermedius: Patent with no evidence of plaque or stenosis. Left circumflex artery: The LCX is non-dominant and patent with no evidence of plaque or stenosis. The LCX gives off 2 patent obtuse marginal branches. Right coronary artery: The RCA is dominant with normal take off from the right coronary cusp. There is severe (70-99) mixed plaque stenosis in the proximal vessel. The RCA terminates as a PDA and right posterolateral branch without evidence of plaque or stenosis. Right Atrium: Right atrial size is within normal limits. Right Ventricle: The right ventricular cavity is within normal limits. Left  Atrium: Left atrial size is normal in size with no left atrial appendage filling defect. Left Ventricle: The ventricular cavity size is within normal limits. There are no stigmata of prior infarction. There is no abnormal filling defect. Pulmonary arteries: Normal in size without proximal filling defect. Pulmonary veins: Normal pulmonary venous drainage. Pericardium: Normal thickness with no significant effusion or calcium present. Cardiac valves: The aortic valve is trileaflet without significant calcification. The mitral valve is normal structure without significant calcification. Aorta: Normal caliber with no significant disease. Extra-cardiac findings: See attached radiology report for non-cardiac structures. IMPRESSION: 1. Coronary calcium score of 60.8. This was 75 percentile for age-, sex, and race-matched controls. 2. Normal coronary origin with right dominance. 3. Severe (70-99) mixed plaque stenosis in the proximal RCA. 4. Study will be sent for FFR. RECOMMENDATIONS: CAD-RADS 4: Severe stenosis. (70-99% or > 50% left main). Cardiac catheterization or CT FFR is recommended. Consider symptom-guided anti-ischemic pharmacotherapy as well as risk factor modification per guideline directed care. Invasive coronary angiography recommended with revascularization per published guideline statements. Kirk Ruths, MD Electronically Signed: By: Kirk Ruths M.D. On: 05/13/2022 11:07   CT CORONARY FRACTIONAL FLOW RESERVE FLUID ANALYSIS  Result Date: 05/13/2022 EXAM: FFRCT ANALYSIS FINDINGS: FFRct analysis was performed on the original cardiac CT angiogram dataset. Diagrammatic representation of the FFRct analysis is provided in a separate PDF document in PACS. This dictation was created using the PDF document and an interactive 3D model of the results. 3D model is not available in the EMR/PACS. Normal FFR range is >0.80. 1. Left Main: findings 2. LAD: findings 0.85 3. LCX: findings 0.93, 0.86 0.76 4.     RCA:  findings 0.73, 0.74 IMPRESSION: FFR suggests RCA stenosis is flow limiting; mildly abnormal FFR distal Lcx likely due to tapering. Note: These examples are not recommendations of [employer] and only provided as examples of what other customers are doing. Electronically Signed   By: Kirk Ruths M.D.   On: 05/13/2022 11:48   MYOCARDIAL PERFUSION IMAGING  Result Date: 04/22/2022   The study is normal. The study is low risk.   The patient reported dyspnea, dizziness, flushing and chest pain (atypical) during the stress test. Normal blood pressure and normal heart rate response noted during stress. Heart rate recovery was normal.   Baseline EKG demonstrated diffuse J point elevated consistent  with early repolarization abnormality   No change from baseline early repolarization changes during infusion.   Left ventricular function is normal. Nuclear stress EF: 57 %. The left ventricular ejection fraction is normal (55-65%). End diastolic cavity size is normal. End systolic cavity size is normal.   Prior study available for comparison from 12/12/2020.   Disposition   Pt is being discharged home today in good condition.  Follow-up Plans & Appointments     Discharge Instructions     Amb Referral to Cardiac Rehabilitation   Complete by: As directed    [place]   Diagnosis: Coronary Stents   After initial evaluation and assessments completed: Virtual Based Care may be provided alone or in conjunction with Phase 2 Cardiac Rehab based on patient barriers.: Yes   Intensive Cardiac Rehabilitation (ICR) [place] location only OR Traditional Cardiac Rehabilitation (TCR) *If criteria for ICR are not met will enroll in TCR [facility] only): Yes   Diet - low sodium heart healthy   Complete by: As directed    Discharge instructions   Complete by: As directed    DO NOT take Viagra/Cialis, or other ED medications while taking Imdur.  NO HEAVY LIFTING OR SEXUAL ACTIVITY X 7 DAYS. NO DRIVING X 2-3 DAYS. NO SOAKING BATHS,  HOT TUBS, POOLS, ETC., X 7 DAYS.   Increase activity slowly   Complete by: As directed         Discharge Medications   Allergies as of 05/22/2022       Reactions   Ciprofloxacin    Headache, Impairment, Felt Sluggish    Flomax [tamsulosin]    Weakness, Dizziness, Impairment    Hydralazine Other (See Comments)   Fatigue and muscle/joint pain   Hydrochlorothiazide Diarrhea, Other (See Comments)   Tremors, fatigue, cough   Lisinopril Other (See Comments)   Dizziness   Mobic [meloxicam]    Increased BP    Nifedipine    Pt does not remember reaction    Norvasc [amlodipine Besylate]    dizziness   Prednisone Other (See Comments)   "vision problems" Blurred vision Visual problem when taken orally   Statins Other (See Comments)   Myalgias (intolerance)   Promethazine Nausea Only      Vytorin [ezetimibe-simvastatin] Diarrhea, Rash   Muscle aches         Medication List     STOP taking these medications    sildenafil 50 MG tablet Commonly known as: VIAGRA   tadalafil 10 MG tablet Commonly known as: Cialis       TAKE these medications    aspirin EC 81 MG tablet Take 1 tablet (81 mg total) by mouth daily. Swallow whole.   clopidogrel 75 MG tablet Commonly known as: PLAVIX Take 1 tablet (75 mg total) by mouth daily with breakfast.   Coenzyme Q10 100 MG capsule Take 100 mg by mouth daily.   cyanocobalamin 500 MCG tablet Commonly known as: VITAMIN B12 Take 500 mcg by mouth daily.   isosorbide mononitrate 30 MG 24 hr tablet Commonly known as: IMDUR Take 0.5 tablets (15 mg total) by mouth daily. Start taking on: May 23, 2022   Krill Oil 500 MG Caps Take 1-2 capsules by mouth See admin instructions. 2 caps in the morning and 1 caps in the evening   loperamide 2 MG tablet Commonly known as: IMODIUM A-D Take 4 mg by mouth daily.   nebivolol 10 MG tablet Commonly known as: Bystolic Take 1 tablet (10 mg total) by mouth daily.  nitroGLYCERIN 0.4  MG SL tablet Commonly known as: NITROSTAT Place 1 tablet (0.4 mg total) under the tongue every 5 (five) minutes as needed for chest pain.   NP Thyroid 90 MG tablet Generic drug: thyroid Take 90 mg by mouth daily.   Repatha SureClick 706 MG/ML Soaj Generic drug: Evolocumab Inject 140 mg into the skin every 14 (fourteen) days.   rosuvastatin 5 MG tablet Commonly known as: Crestor Take 1 tablet (5 mg total) by mouth daily. TAKE 1 TABLET BY MOUTH Monday, Wednesday, AND Friday ONLY What changed: when to take this   valsartan 160 MG tablet Commonly known as: Diovan Take 1 tablet (160 mg total) by mouth daily.   Vitamin D3 125 MCG (5000 UT) Caps Take 5,000 Units by mouth daily.   vitamin E 180 MG (400 UNITS) capsule Take 400 Units by mouth daily.           Outstanding Labs/Studies    Duration of Discharge Encounter   Greater than 30 minutes including physician time.  SignedLily Kocher, PA-C 05/22/2022, 12:10 PM   Patient seen and examined with Lily Kocher PA-C.  Agree as above, with the following exceptions and changes as noted below. Chest pain remains but is improved since yesterday with initiation of imdur. Will not uptitrate due to headace. Gen: NAD, CV: RRR, no murmurs, Lungs: clear, Abd: soft, Extrem: Warm, well perfused, no edema, Neuro/Psych: alert and oriented x 3, normal mood and affect. All available labs, radiology testing, previous records reviewed. Stable for hospital discharge after cardiac rehab has seen and assessed. I have instructed the patient that dual antiplatelet therapy should be taken for 1 year without interruption.  We have discussed the consequences of interrupted dual antiplatelet therapy and the risk for in-stent thrombosis.  F/u has been arranged.  Elouise Munroe, MD 05/22/22 12:14 PM

## 2022-05-23 ENCOUNTER — Telehealth (HOSPITAL_BASED_OUTPATIENT_CLINIC_OR_DEPARTMENT_OTHER): Payer: Self-pay

## 2022-05-23 NOTE — Telephone Encounter (Addendum)
Called patient and confirmed appointment time and details. Patient states he completed his e-check in and will see [place] on the 11/14    ----- Message from Lily Kocher, PA-C sent at 05/22/2022  4:33 PM EST ----- Regarding: TOC call This patient is scheduled for a TOC appointment with Urban Gibson on 11/14 '@10'$ :55 and needs a TOC call.   Thanks!  Lily Kocher PA-C

## 2022-05-27 ENCOUNTER — Ambulatory Visit (INDEPENDENT_AMBULATORY_CARE_PROVIDER_SITE_OTHER): Payer: BC Managed Care – PPO | Admitting: Family

## 2022-05-27 ENCOUNTER — Encounter (HOSPITAL_BASED_OUTPATIENT_CLINIC_OR_DEPARTMENT_OTHER): Payer: Self-pay | Admitting: Family

## 2022-05-27 VITALS — BP 130/92 | HR 68 | Ht 71.0 in | Wt 179.6 lb

## 2022-05-27 DIAGNOSIS — I25118 Atherosclerotic heart disease of native coronary artery with other forms of angina pectoris: Secondary | ICD-10-CM

## 2022-05-27 DIAGNOSIS — I10 Essential (primary) hypertension: Secondary | ICD-10-CM

## 2022-05-27 DIAGNOSIS — E785 Hyperlipidemia, unspecified: Secondary | ICD-10-CM

## 2022-05-27 DIAGNOSIS — I6523 Occlusion and stenosis of bilateral carotid arteries: Secondary | ICD-10-CM

## 2022-05-27 DIAGNOSIS — R5383 Other fatigue: Secondary | ICD-10-CM

## 2022-05-27 DIAGNOSIS — R002 Palpitations: Secondary | ICD-10-CM

## 2022-05-27 MED ORDER — CLOPIDOGREL BISULFATE 75 MG PO TABS: 75.0000 mg | ORAL_TABLET | Freq: Every day | ORAL | 3 refills | Status: DC

## 2022-05-27 NOTE — Patient Instructions (Signed)
Medication Instructions:  Remain off Isosorbide (Imdur).    *If you need a refill on your cardiac medications before your next appointment, please call your pharmacy*   Lab Work/Testing/Procedures: Your physician has requested that you have a carotid duplex. This test is an ultrasound of the carotid arteries in your neck. It looks at blood flow through these arteries that supply the brain with blood. Allow one hour for this exam. There are no restrictions or special instructions.   Follow-Up: At [facility], you and your health needs are our priority.  As part of our continuing mission to provide you with exceptional heart care, we have created designated Provider Care Teams.  These Care Teams include your primary Cardiologist (physician) and Advanced Practice Providers (APPs -  Physician Assistants and Nurse Practitioners) who all work together to provide you with the care you need, when you need it.  We recommend signing up for the patient portal called "MyChart".  Sign up information is provided on this After Visit Summary.  MyChart is used to connect with patients for Virtual Visits (Telemedicine).  Patients are able to view lab/test results, encounter notes, upcoming appointments, etc.  Non-urgent messages can be sent to your provider as well.   To learn more about what you can do with MyChart, go to [email or web address].    Your next appointment:   As scheduled with Dr. Oval Linsey  Other Instructions  Heart Healthy Diet Recommendations: A low-salt diet is recommended. Meats should be grilled, baked, or boiled. Avoid fried foods. Focus on lean protein sources like fish or chicken with vegetables and fruits. The American Heart Association is a Microbiologist!  American Heart Association Diet and Lifeystyle Recommendations   Exercise recommendations: The American Heart Association recommends 150 minutes of moderate intensity exercise weekly. Try 30 minutes of moderate intensity  exercise 4-5 times per week. This could include walking, jogging, or swimming.  For coronary artery disease often called "heart disease" we aim for optimal guideline directed medical therapy. We use the "A, B, C"s to help keep [place] on track!  A = Aspirin '81mg'$  daily B = Blood pressure control. C = Cholesterol control. You take Repatha to help control your cholesterol.  D = Don't forget nitroglycerin! This is an emergency tablet to be used if you have chest pain. E = Extras. In your case, this is Plavix to protect your stent. We will continue Plavix and Aspirin for 6 months then switch to Plavix alone.   Important Information About Sugar

## 2022-05-27 NOTE — Progress Notes (Signed)
Office Visit    Patient Name: Sean Mckay Date of Encounter: 05/27/2022  PCP:  Hayden Rasmussen, MD   [place]  Cardiologist:  Skeet Latch, MD  Advanced Practice Provider:  Loel Dubonnet, NP Electrophysiologist:  None   Chief Complaint    Sean Mckay is a 56 y.o. male presents today for follow up after coronary stent.   Past Medical History    Past Medical History:  Diagnosis Date   Adrenal adenoma 05/09/2016   CAD in native artery 04/30/2021   Essential hypertension 05/09/2016   Hyperlipidemia    Hypertriglyceridemia 05/09/2016   Palpitations 12/06/2020   Pure hypercholesterolemia 12/06/2020   Past Surgical History:  Procedure Laterality Date   CORONARY STENT INTERVENTION N/A 05/20/2022   Procedure: CORONARY STENT INTERVENTION;  Surgeon: Belva Crome, MD;  Location: [facility];  Service: Cardiovascular;  Laterality: N/A;   HERNIA REPAIR  02/22/09   RIH   LEFT HEART CATH AND CORONARY ANGIOGRAPHY N/A 05/20/2022   Procedure: LEFT HEART CATH AND CORONARY ANGIOGRAPHY;  Surgeon: Belva Crome, MD;  Location: [facility];  Service: Cardiovascular;  Laterality: N/A;    Allergies  Allergies  Allergen Reactions   Ciprofloxacin     Headache, Impairment, Felt Sluggish    Flomax [Tamsulosin]     Weakness, Dizziness, Impairment    Hydralazine Other (See Comments)    Fatigue and muscle/joint pain    Hydrochlorothiazide Diarrhea and Other (See Comments)    Tremors, fatigue, cough   Lisinopril Other (See Comments)    Dizziness    Mobic [Meloxicam]     Increased BP    Nifedipine     Pt does not remember reaction    Norvasc [Amlodipine Besylate]     dizziness   Prednisone Other (See Comments)    "vision problems" Blurred vision Visual problem when taken orally    Statins Other (See Comments)    Myalgias (intolerance)   Promethazine Nausea Only        Vytorin [Ezetimibe-Simvastatin] Diarrhea and  Rash    Muscle aches     History of Present Illness    Sean Mckay is a 56 y.o. male with a hx of HTN, HLD, palpitations, CAD, aortic atherosclerosis  last seen while admitted.  He has established with Dr. Oval Linsey due to poorly controlled hypotension and multiple medication intolerances. Previous renal artery dopplers with no evidence of renal artery stenosis. Previous carotid dopplers due to syncope with no obstruction. Nuclear stress test negative for ischemia. Previous holter monitor years ago with PAC's and PVCs. Cardiac CTA 2017 with coronary calcium score of 0 and pulmonary artery mildly dilated at 3.1x2.7 cm. Sleep study 2017 no evidence of OSA. 48-hour holter with PAC and PVC with no significant arrhythmia. Echo 04/13/18 normal LVEF, trivial TR.   Previous intolerance to HCTZ, increased doses of Nebivolol, Zetia, Amlodipine (syncope), Lisinopril (dizziness), Nifedipine (fatigue, SOB).  May 2022 presented with lightheadedness, palpitations, presyncope, shortness of breath. Lexiscan myoview was poor quality study. Subsequent cardiac CTA with stable small hiatal hernia, coronary calcium score of 24.9 plaicng him in 65th percentile, moderate (50-69%) mixed plaque stenosis in prox RCA - overall CAD-RADS 3 moderate stenosis. He contacted the office noting palpitations and wore ZIO for 6 days. Average heart rate 68bpm. Predominantly NSR. PAC and PVC with <1% burden, no significant arrhythmia. He had 55 triggered events during wear time which were predominantly associated with NSR or PVCs.  Seen 08/2021 doing overall well from a cardiac perspective.  April 2023 he had bilateral inguinal hernia surgery.Seen 01/20/22 with his husband doing well. Noted increased myalgias with increased Rosuvastatin. He was prescribed Repatha but never stopped it. 11/2021 LDL 122 and repeat 03/20/22 after Repatha LDL 113. Seen 04/15/22 noting chest discomfort with subsequent myoview 04/15/22 no ischemia. Given persistent  chest discomfort was recommended for LHC, but preferred to start with cardiac CTA. Amlodipine trialed but did not tolerate due to dizziness. Cardiac CTA 05/13/22 with coronary calcium score 60.8 placing him in 71st percentile with severe mixed plaque in RCA positive by FFR.  Subsequent LHC 2022/06/05 with DES-RCA. Had post procedure chest pain and was admitted for observation. He was started on Imdur '15mg'$  daily and Cialis/Viagra held. Losartan, Bystolic, Repatha, Rosuvastatin were continued.   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 Since hospital discharge he has self discontinued Imdur due to headache and hypotension. Has not yet started Repatha due to being uncomfortable with injections. He notes that he has cystoscopy on 05/30/2022 due to bladder concerns.   He notes that his chest pain has improved since catheterization. He will experience a pinch feeling in his chest that last less than a second. He overall notes general fatigue that is bothering him the most today. He plans to attend cardiac rehab. He notes that his right arm is painful since his cath where bruising noted though bruising has been improving.  Reports no shortness of breath nor dyspnea on exertion. Reports no edema, orthopnea, PND. Reports no palpitations, syncope, lightheadedness.     EKGs/Labs/Other Studies Reviewed:   The following studies were reviewed today: LHC June 05, 2022  CONCLUSIONS: Segmental 80% proximal to mid RCA stenosis.  Mid to distal irregularities up to 30%. The proximal stenosis was treated with a 24 x 3.5 mm drug-eluting stent postdilated to 4.0 mm in diameter with TIMI grade III flow noted.  0% stenosis was noted. Left main coronary artery is widely patent. The LAD is transapical and contains no significant obstruction. Circumflex contains mid vessel irregularities up to 30 to 40%. LV function is normal with estimated EF 50 to 55%.  EDP 15 mmHg.   RECOMMENDATIONS:   Clopidogrel  and aspirin dual antiplatelet therapy for 6 months.  After 6 months okay to drop aspirin.  Continue clopidogrel indefinitely unless bleeding issues. Aggressive risk factor modification Patient having discomfort post procedure without objective evidence of ischemia on EKG, coronary angiography, and therefore will be admitted to the hospital for overnight stay.    Diagnostic Dominance: Right  Intervention     Myoview 04/22/22   The study is normal. The study is low risk.   The patient reported dyspnea, dizziness, flushing and chest pain (atypical) during the stress test. Normal blood pressure and normal heart rate response noted during stress. Heart rate recovery was normal.   Baseline EKG demonstrated diffuse J point elevated consistent with early repolarization abnormality   No change from baseline early repolarization changes during infusion.   Left ventricular function is normal. Nuclear stress EF: 57 %. The left ventricular ejection fraction is normal (55-65%). End diastolic cavity size is normal. End systolic cavity size is normal.   Prior study available for comparison from 12/12/2020.  Cardiac CTA 01/08/21 FINDINGS: Image quality: Good   Coronary Arteries:  Normal coronary origin.  Right dominance.   Left main: The left main is a large caliber vessel with a normal take off from the left coronary cusp that trifurcates  into a LAD, LCX, and ramus intermedius. There is no plaque or stenosis.   Left anterior descending artery: The LAD has minimal (0-24%) soft plaque in the proximal vessel.   Ramus intermedius: Branching vessel; patent with no evidence of plaque or stenosis.   Left circumflex artery: The LCX is non-dominant and patent with no evidence of plaque or stenosis. The LCX gives off 2 large patent obtuse marginal branches.   Right coronary artery: The RCA is dominant with normal take off from the right coronary cusp. There is moderate (50-69%) mixed plaque stenosis in  the proximal vessel. The RCA terminates as a small PDA and right posterolateral branch.   Right Atrium: Right atrial size is within normal limits.   Right Ventricle: The right ventricular cavity is within normal limits.   Left Atrium: Left atrial size is normal in size with no left atrial appendage filling defect.   Left Ventricle: The ventricular cavity size is within normal limits. There are no stigmata of prior infarction. There is no abnormal filling defect.   Pulmonary veins: Normal pulmonary venous drainage.   Pericardium: Normal thickness with no significant effusion or calcium present.   Cardiac valves: The aortic valve is trileaflet without significant calcification. The mitral valve is normal structure without significant calcification.   Aorta: Normal caliber with no significant disease.   Extra-cardiac findings: See attached radiology report for non-cardiac structures.   IMPRESSION: 1. Coronary calcium score of 24.9. This was 28 percentile for age-, sex, and race-matched controls.   2. Normal coronary origin with right dominance.   3. Moderate (50-69%) mixed plaque stenosis in the proximal RCA.   4. Study will be sent for FFR.   RECOMMENDATIONS: CAD-RADS 3: Moderate stenosis. Consider symptom-guided anti-ischemic pharmacotherapy as well as risk factor modification per guideline directed care. Additional analysis with CT FFR will be submitted.   FINDINGS: Image quality: Good   Coronary Arteries:  Normal coronary origin.  Right dominance.   Left main: The left main is a large caliber vessel with a normal take off from the left coronary cusp that trifurcates into a LAD, LCX, and ramus intermedius. There is no plaque or stenosis.   Left anterior descending artery: The LAD has minimal (0-24%) soft plaque in the proximal vessel.   Ramus intermedius: Branching vessel; patent with no evidence of plaque or stenosis.   Left circumflex artery: The LCX is  non-dominant and patent with no evidence of plaque or stenosis. The LCX gives off 2 large patent obtuse marginal branches.   Right coronary artery: The RCA is dominant with normal take off from the right coronary cusp. There is moderate (50-69%) mixed plaque stenosis in the proximal vessel. The RCA terminates as a small PDA and right posterolateral branch.   Right Atrium: Right atrial size is within normal limits.   Right Ventricle: The right ventricular cavity is within normal limits.   Left Atrium: Left atrial size is normal in size with no left atrial appendage filling defect.   Left Ventricle: The ventricular cavity size is within normal limits. There are no stigmata of prior infarction. There is no abnormal filling defect.   Pulmonary veins: Normal pulmonary venous drainage.   Pericardium: Normal thickness with no significant effusion or calcium present.   Cardiac valves: The aortic valve is trileaflet without significant calcification. The mitral valve is normal structure without significant calcification.   Aorta: Normal caliber with no significant disease.   Extra-cardiac findings: See attached radiology report for non-cardiac structures.  IMPRESSION: 1. Coronary calcium score of 24.9. This was 69 percentile for age-, sex, and race-matched controls.   2. Normal coronary origin with right dominance.   3. Moderate (50-69%) mixed plaque stenosis in the proximal RCA.   4. Study will be sent for FFR.   RECOMMENDATIONS: CAD-RADS 3: Moderate stenosis. Consider symptom-guided anti-ischemic pharmacotherapy as well as risk factor modification per guideline directed care. Additional analysis with CT FFR will be submitted.    1. Left Main: findings 0.99, 0.99 0.99   2. LAD: findings 0.97, 0.91 0.86   3. LCX: findings 0.99, 0.85 0.86; OM2: 0.84, 0.80, 0.77   4. Ramus: findings 0.98, 0.97 0.95   5. RCA: findings 0.98, 0.87 0.86   IMPRESSION: FFR abnormal in  very distal OM2 but otherwise normal; medical therapy likely best option.  Lexiscan Myoview 12/03/15: LVEF 61%.  No ischemia.   Carotid artery Doppler 12/05/15: 1-39% ICA stenosis bilaterally   Echo 12/03/15: Normal LV size and function.  Mild MR, trace TR.   Echo 04/13/18: LVEF 55-60%.  Trivial TR.   48 Hour Event Monitor 04/13/18:   Quality: Fair.  Baseline artifact. Predominant rhythm: sinus rhythm Average heart rate: 67 bpm Max heart rate: 120 bpm Min heart rate: 47 bpm   Rare PVCs and occasional PACs 3 beats of NSVT   Patient reported episodes of chest pain and jaw pain, at which time sinus rhythm was noted.  EKG: EKG ordered today. EKG performed today demonstrates NSR 68 bpm with no acute ST/T wave changes.   Recent Labs: 05/18/2022: ALT 14 05/22/2022: BUN 9; Creatinine, Ser 0.95; Hemoglobin 14.3; Magnesium 1.8; Platelets 287; Potassium 3.9; Sodium 138  Recent Lipid Panel    Component Value Date/Time   CHOL 197 03/20/2022 0949   TRIG 227 (H) 03/20/2022 0949   HDL 45 03/20/2022 0949   CHOLHDL 4.4 03/20/2022 0949   LDLCALC 113 (H) 03/20/2022 0949    Home Medications   Current Meds  Medication Sig   aspirin EC 81 MG tablet Take 1 tablet (81 mg total) by mouth daily. Swallow whole.   Cholecalciferol (VITAMIN D3) 125 MCG (5000 UT) CAPS Take 5,000 Units by mouth daily.   clopidogrel (PLAVIX) 75 MG tablet Take 1 tablet (75 mg total) by mouth daily with breakfast.   Coenzyme Q10 100 MG capsule Take 100 mg by mouth daily.   cyanocobalamin (VITAMIN B12) 500 MCG tablet Take 500 mcg by mouth daily.   Krill Oil 500 MG CAPS Take 1-2 capsules by mouth See admin instructions. 2 caps in the morning and 1 caps in the evening   loperamide (IMODIUM A-D) 2 MG tablet Take 4 mg by mouth daily.   nebivolol (BYSTOLIC) 10 MG tablet Take 1 tablet (10 mg total) by mouth daily.   nitroGLYCERIN (NITROSTAT) 0.4 MG SL tablet Place 1 tablet (0.4 mg total) under the tongue every 5 (five) minutes  as needed for chest pain.   NP THYROID 90 MG tablet Take 90 mg by mouth daily.   rosuvastatin (CRESTOR) 5 MG tablet Take 1 tablet (5 mg total) by mouth daily. TAKE 1 TABLET BY MOUTH Monday, Wednesday, AND Friday ONLY (Patient taking differently: Take 5 mg by mouth every Monday, Wednesday, and Friday. TAKE 1 TABLET BY MOUTH Monday, Wednesday, AND Friday ONLY)   valsartan (DIOVAN) 160 MG tablet Take 1 tablet (160 mg total) by mouth daily.   vitamin E 180 MG (400 UNITS) capsule Take 400 Units by mouth daily.     Review of  Systems   All other systems reviewed and are otherwise negative except as noted above.  Physical Exam    VS:  BP (!) 130/92 (BP Location: Left Arm, Patient Position: Sitting, Cuff Size: Normal)   Pulse 68   Ht '5\' 11"'$  (1.803 m)   Wt 179 lb 9.6 oz (81.5 kg)   BMI 25.05 kg/m  , BMI Body mass index is 25.05 kg/m.  Wt Readings from Last 3 Encounters:  05/27/22 179 lb 9.6 oz (81.5 kg)  05/20/22 199 lb (90.3 kg)  05/18/22 180 lb (81.6 kg)    GEN: Well nourished, well developed, in no acute distress. HEENT: normal. Neck: Supple, no JVD, carotid bruits, or masses. Cardiac: RRR, no murmurs, rubs, or gallops. No clubbing, cyanosis, edema.  Radials/PT 2+ and equal bilaterally.  Respiratory:  Respirations regular and unlabored, clear to auscultation bilaterally. GI: Soft, nontender, nondistended. MS: No deformity or atrophy. Skin: Warm and dry, no rash. Neuro:  Strength and sensation are intact. Psych: Normal affect.  Assessment & Plan    CAD s/p DES - RCA / Aortic atherosclerosis - 05/20/22 DES to RCA. Right arm/wrist healing well w/o hematoma. Has self d/c Imdur due to hypotension, headache. Chest discomfort is atypical with no exertional pain. No need for ischemic evaluation at this time. GDMT includes Nebivolol, Rosuvastatin, Plavix,  ASA, PRN Nitroglycerin. Plan to d/c ASA after 6 months.   Palpitations - ZIO monitor 01/2021 with predominantly NSR. Continue Bystolic '10mg'$   QD. Recommend stay well hydrated, avoid caffeine, manage stress well. He denies palpitations, tachycardia in clinic today.   HTN -  BP 130s/80s at home, 130/92 in clinic today. He will monitor at home for 2 weeks and report if BP consistently >130/80. If noted, could increase dose Valsartan to '320mg'$ . Previously did not tolerate Amlodipine due to dizziness and Imdur due to hypotension.   Carotid stenosis - 11/2015 carotid duplex bilateral 1-39% stenosis. Update carotid duplex for monitoring. No dizziness/lightheadedness/syncope.   HLD, LDL goal <70 -  04/2021 LDL 130. 03/2022 LDL 113. 05/21/22 Lpa 195.3. Tolerates rosuvastatin 5 mg 3 times per week.  Did not tolerate increased dose. Has Repatha but not yet started, concerned about injections. Will have him come for nurse visit to assist with first injection next week.   ED - Continue to follow with PCP, urology. As he has discontinued Imdur, could utilize Tadalafil/Sildenafil if needed. Nitroglycerin precautions given.   Fatigue: Most likely related to deconditioning post catheterization. Plan for cardiac rehab for exercise and strength training.     Cardiac Rehabilitation Eligibility Assessment  The patient is ready to start cardiac rehabilitation from a cardiac standpoint.    Disposition: Follow up as scheduled with Dr. Oval Linsey.  Signed, Loel Dubonnet, NP 05/27/2022, 11:20 AM [place]

## 2022-06-02 ENCOUNTER — Ambulatory Visit (INDEPENDENT_AMBULATORY_CARE_PROVIDER_SITE_OTHER): Payer: BC Managed Care – PPO

## 2022-06-02 ENCOUNTER — Encounter (HOSPITAL_BASED_OUTPATIENT_CLINIC_OR_DEPARTMENT_OTHER): Payer: Self-pay

## 2022-06-02 VITALS — Ht 71.0 in

## 2022-06-02 DIAGNOSIS — I25118 Atherosclerotic heart disease of native coronary artery with other forms of angina pectoris: Secondary | ICD-10-CM

## 2022-06-02 DIAGNOSIS — E785 Hyperlipidemia, unspecified: Secondary | ICD-10-CM

## 2022-06-02 NOTE — Progress Notes (Signed)
   Nurse Visit   Date of Encounter: 06/02/2022 ID: JJESUS DINGLEY, DOB 13-Apr-1966, MRN [id number]  PCP:  Hayden Rasmussen, MD   [facility] Cardiologist:  Skeet Latch, MD Cardiology APP:  Loel Dubonnet, NP      Visit Details   VS:  Ht '5\' 11"'$  (1.803 m)   BMI 25.05 kg/m  , BMI Body mass index is 25.05 kg/m.  Wt Readings from Last 3 Encounters:  05/27/22 179 lb 9.6 oz (81.5 kg)  05/20/22 199 lb (90.3 kg)  05/18/22 180 lb (81.6 kg)     Reason for visit: Repatha First Injection  Performed today: Education and first injection performed Changes (medications, testing, etc.) : no changes made today  Length of Visit: 10 minutes    Medications Adjustments/Labs and Tests Ordered: No orders of the defined types were placed in this encounter.  No orders of the defined types were placed in this encounter.    Signed, Gerald Stabs, RN  06/02/2022 1:14 PM

## 2022-06-02 NOTE — Telephone Encounter (Signed)
Please advise 

## 2022-06-02 NOTE — Patient Instructions (Signed)
Medication Instructions:  Your Physician recommend you continue on your current medication as directed.    *If you need a refill on your cardiac medications before your next appointment, please call your pharmacy*  Follow-Up: At [facility], you and your health needs are our priority.  As part of our continuing mission to provide you with exceptional heart care, we have created designated Provider Care Teams.  These Care Teams include your primary Cardiologist (physician) and Advanced Practice Providers (APPs -  Physician Assistants and Nurse Practitioners) who all work together to provide you with the care you need, when you need it.  We recommend signing up for the patient portal called "MyChart".  Sign up information is provided on this After Visit Summary.  MyChart is used to connect with patients for Virtual Visits (Telemedicine).  Patients are able to view lab/test results, encounter notes, upcoming appointments, etc.  Non-urgent messages can be sent to your provider as well.   To learn more about what you can do with MyChart, go to [email or web address].    Your next appointment:   Follow up as scheduled   Other Instructions Cardiac Rehab information is in your mychart portal.   Important Information About Sugar

## 2022-06-03 ENCOUNTER — Telehealth (HOSPITAL_COMMUNITY): Payer: Self-pay

## 2022-06-03 NOTE — Telephone Encounter (Signed)
Per note from [facility] I, "CRPII, pt is interested and will be referred to [facility]."  Faxed CR referral to [facility].

## 2022-06-04 NOTE — Telephone Encounter (Signed)
Please advise 

## 2022-06-10 NOTE — Telephone Encounter (Signed)
Hey who is your contact at the [facility]? I can reach out to them again if need be?

## 2022-06-13 ENCOUNTER — Ambulatory Visit (HOSPITAL_BASED_OUTPATIENT_CLINIC_OR_DEPARTMENT_OTHER): Payer: BC Managed Care – PPO

## 2022-06-13 DIAGNOSIS — I6523 Occlusion and stenosis of bilateral carotid arteries: Secondary | ICD-10-CM

## 2022-06-19 ENCOUNTER — Encounter (HOSPITAL_BASED_OUTPATIENT_CLINIC_OR_DEPARTMENT_OTHER): Payer: Self-pay

## 2022-06-19 MED ORDER — VALSARTAN 320 MG PO TABS: 320.0000 mg | ORAL_TABLET | Freq: Every day | ORAL | 6 refills | Status: DC

## 2022-06-19 NOTE — Telephone Encounter (Signed)
He may either  (1) trial Amlodipine 2.'5mg'$  daily as it has antianginal benefit and will lower BP. He did have dizziness previously while taking but it was when his blood pressure was low  Or  (2) increase Valsartan to '320mg'$  daily.   Follow up with pharmacy team and Dr. Oval Linsey as scheduled  Loel Dubonnet, NP

## 2022-06-19 NOTE — Telephone Encounter (Signed)
BP elevated still, please advise

## 2022-06-20 ENCOUNTER — Telehealth (HOSPITAL_COMMUNITY): Payer: Self-pay

## 2022-06-20 NOTE — Telephone Encounter (Signed)
Returned pt missed phone call (VM)   Attempted to call patient in regards to Cardiac Rehab - LM on VM

## 2022-06-23 ENCOUNTER — Ambulatory Visit
Payer: BC Managed Care – PPO | Attending: Cardiology | Admitting: Pharmacist Clinician (PhC)/ Clinical Pharmacy Specialist

## 2022-06-23 ENCOUNTER — Other Ambulatory Visit: Payer: Self-pay | Admitting: Cardiovascular Disease

## 2022-06-23 ENCOUNTER — Encounter: Payer: Self-pay | Admitting: Pharmacist Clinician (PhC)/ Clinical Pharmacy Specialist

## 2022-06-23 VITALS — BP 128/86 | HR 61 | Ht 72.0 in | Wt 182.0 lb

## 2022-06-23 DIAGNOSIS — I10 Essential (primary) hypertension: Secondary | ICD-10-CM | POA: Diagnosis not present

## 2022-06-23 MED ORDER — NIFEDIPINE ER OSMOTIC RELEASE 30 MG PO TB24: 30.0000 mg | ORAL_TABLET | Freq: Every day | ORAL | 0 refills | Status: DC

## 2022-06-23 NOTE — Progress Notes (Signed)
Office Visit    Patient Name: Sean Mckay Date of Encounter: 06/23/2022  Primary Care Provider:  Hayden Rasmussen, MD Primary Cardiologist:  Skeet Latch, MD  Chief Complaint    Hypertension - Advanced hypertension clinic  Past Medical History   hyperlipidemia 11/23 Lp(a) 195.3, 9/23 LDL 113, now on rosuvastatin tiw, evolocumab  CAD 11/23 80 % mid RCA stenosis, treated with DES  Chest pain Improved since cath -     Allergies  Allergen Reactions   Ciprofloxacin     Headache, Impairment, Felt Sluggish    Flomax [Tamsulosin]     Weakness, Dizziness, Impairment    Hydralazine Other (See Comments)    Fatigue and muscle/joint pain    Hydrochlorothiazide Diarrhea and Other (See Comments)    Tremors, fatigue, cough   Lisinopril Other (See Comments)    Dizziness    Mobic [Meloxicam]     Increased BP    Nifedipine     Pt does not remember reaction    Norvasc [Amlodipine Besylate]     dizziness   Prednisone Other (See Comments)    "vision problems" Blurred vision Visual problem when taken orally    Statins Other (See Comments)    Myalgias (intolerance)   Promethazine Nausea Only        Vytorin [Ezetimibe-Simvastatin] Diarrhea and Rash    Muscle aches     History of Present Illness    Sean Mckay is a 56 y.o. male patient who was referred to the [facility]  I saw [name] last month for lipids, but he had just come from a night in the ED because of elevated BP readings (systolic > 322).  Patient had doubled his valsartan from 80-160 mg.  Since then he was seen by Laurann Montana, at which time his pressure was 130/92.  He was asked to continue monitoring pressures and sent in a list of the readings last Thursday.  Based on that he was asked to increase the valsartan to 320 mg.   It has only been 4 days with him on the higher dose, so would not expect to see full benefit at this time.    He has several concerns today, first in  that he believes all the problems with uncontrolled BP started after having his stress test last June (2022).  He also notes that in 2017 he was told he had an adrenal adenoma, but cannot recall that anything else was said or done about this.  Today he notes he still feels that he gets DOE too easily, and notes palpitations, which he associates with elevated BP readings.  .    Blood Pressure Goal:  130/80  Current Medications: valsartan 320 mg qd  Previously tried:   amlodipine - SOB, flushing, headache, pressure/pounding in chest    Hydralazine - fatigue, muscle pain   Hctz - tremors, diarrhea   Lisinopril - dizziness   Nifedipine - can't recall reaction  Social history:  No tobacco, beer occasionally; 4 cans of coke daily   Family history: father had MI, stroke, CABG x 4 died at 44, mother MI CABG x 38, mom now 21, mgf SCD at 74, youngest brother SCD 87, multiple uncles (both sides) with MI's CABG's; no children   Diet: mix of home and eating out; no pork or beef, not much chicken, mostly fish for protein; mostly vegetarian diet; (has trouble processing proteins) IBS; fish and eggs for protein;    Exercise:  active with work,  home; 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, cows, putting up hay right now  Home BP readings:    home cuff read 11/8 points higher than the office cuff  11 AM readings average 134/95 HR 75  (range  114-160/80-106)  9 PM readings average   140/101 HR 72  (range 114/166/80-113)   Accessory Clinical Findings    Lab Results  Component Value Date   CREATININE 0.95 05/22/2022   BUN 9 05/22/2022   NA 138 05/22/2022   K 3.9 05/22/2022   CL 105 05/22/2022   CO2 26 05/22/2022   Lab Results  Component Value Date   ALT 14 05/18/2022   AST 12 (L) 05/18/2022   ALKPHOS 43 05/18/2022   BILITOT 0.5 05/18/2022   No results found for: "HGBA1C"  Home Medications    Current Outpatient Medications  Medication Sig Dispense Refill   NIFEdipine (PROCARDIA-XL/NIFEDICAL-XL) 30 MG 24 hr  tablet Take 1 tablet (30 mg total) by mouth daily. 30 tablet 0   aspirin EC 81 MG tablet Take 1 tablet (81 mg total) by mouth daily. Swallow whole. 90 tablet 3   Cholecalciferol (VITAMIN D3) 125 MCG (5000 UT) CAPS Take 5,000 Units by mouth daily.     clopidogrel (PLAVIX) 75 MG tablet Take 1 tablet (75 mg total) by mouth daily with breakfast. 90 tablet 3   Coenzyme Q10 100 MG capsule Take 100 mg by mouth daily.     cyanocobalamin (VITAMIN B12) 500 MCG tablet Take 500 mcg by mouth daily.     Evolocumab (REPATHA SURECLICK) 741 MG/ML SOAJ Inject 140 mg into the skin every 14 (fourteen) days. 2 mL 12   Krill Oil 500 MG CAPS Take 1-2 capsules by mouth See admin instructions. 2 caps in the morning and 1 caps in the evening     loperamide (IMODIUM A-D) 2 MG tablet Take 4 mg by mouth daily.     nebivolol (BYSTOLIC) 10 MG tablet Take 1 tablet (10 mg total) by mouth daily. 90 tablet 3   nitroGLYCERIN (NITROSTAT) 0.4 MG SL tablet Place 1 tablet (0.4 mg total) under the tongue every 5 (five) minutes as needed for chest pain. 25 tablet 3   NP THYROID 90 MG tablet Take 90 mg by mouth daily.     rosuvastatin (CRESTOR) 5 MG tablet Take 1 tablet (5 mg total) by mouth daily. TAKE 1 TABLET BY MOUTH Monday, Wednesday, AND Friday ONLY (Patient taking differently: Take 5 mg by mouth every Monday, Wednesday, and Friday. TAKE 1 TABLET BY MOUTH Monday, Wednesday, AND Friday ONLY) 90 tablet 3   valsartan (DIOVAN) 320 MG tablet Take 1 tablet (320 mg total) by mouth daily. 30 tablet 6   vitamin E 180 MG (400 UNITS) capsule Take 400 Units by mouth daily.     No current facility-administered medications for this visit.     Assessment & Plan        Essential hypertension Assessment: BP is un/controlled in office BP 128/86 mmHg above the goal (<130/80). Tolerates valsartan well without any side effects  Continues to have DOE and some palpitations Reiterated the importance of regular exercise and low salt diet   Plan:   Start taking nifedipine xl 30 mg once daily (pt agreeable as he cannot recall why he stopped) Stop valsartan for 3-4 days and have labs drawn (fasting) Thursday or Friday morning.  Restart valsartan once labs drawn. Check for hyperaldosteronism and pheochromocytoma Patient to keep record of BP readings with heart rate and report to [place]  at the next visit Patient to see Dr. Oval Linsey in January for follow up  Reach out via MyChart if he has problems tolerating the nifedipine.   Tommy Medal PharmD CPP [place]  [address] [place], [place] [address] [phone]

## 2022-06-23 NOTE — Patient Instructions (Signed)
Go to the lab  Thursday or Friday, first thing in the morning.    Take your BP meds as follows:  STOP VALSARTAN UNTIL AFTER YOUR BLOOD TEST   START NIFEDIPINE XL 30 MG ONCE DAILY.  Check your blood pressure at home daily and keep record of the readings.  Hypertension "High blood pressure"  Hypertension is often called "The Silent Killer." It rarely causes symptoms until it is extremely  high or has done damage to other organs in the body. For this reason, you should have your  blood pressure checked regularly by your physician. We will check your blood pressure  every time you see a provider at one of our offices.   Your blood pressure reading consists of two numbers. Ideally, blood pressure should be  below 120/80. The first ("top") number is called the systolic pressure. It measures the  pressure in your arteries as your heart beats. The second ("bottom") number is called the diastolic pressure. It measures the pressure in your arteries as the heart relaxes between beats.  The benefits of getting your blood pressure under control are enormous. A 10-point  reduction in systolic blood pressure can reduce your risk of stroke by 27% and heart failure by 28%  Your blood pressure goal is < 130/80  To check your pressure at home you will need to:  1. Sit up in a chair, with feet flat on the floor and back supported. Do not cross your ankles or legs. 2. Rest your left arm so that the cuff is about heart level. If the cuff goes on your upper arm,  then just relax the arm on the table, arm of the chair or your lap. If you have a wrist cuff, we  suggest relaxing your wrist against your chest (think of it as Pledging the Flag with the  wrong arm).  3. Place the cuff snugly around your arm, about 1 inch above the crook of your elbow. The  cords should be inside the groove of your elbow.  4. Sit quietly, with the cuff in place, for about 5 minutes. After that 5 minutes press the power   button to start a reading. 5. Do not talk or move while the reading is taking place.  6. Record your readings on a sheet of paper. Although most cuffs have a memory, it is often  easier to see a pattern developing when the numbers are all in front of you.  7. You can repeat the reading after 1-3 minutes if it is recommended  Make sure your bladder is empty and you have not had caffeine or tobacco within the last 30 min  Always bring your blood pressure log with you to your appointments. If you have not brought your monitor in to be double checked for accuracy, please bring it to your next appointment.  You can find a list of quality blood pressure cuffs at validatebp.org

## 2022-06-23 NOTE — Telephone Encounter (Signed)
Rx request sent to pharmacy.  

## 2022-06-23 NOTE — Assessment & Plan Note (Addendum)
Assessment: BP is un/controlled in office BP 128/86 mmHg above the goal (<130/80). Tolerates valsartan well without any side effects  Continues to have DOE and some palpitations Reiterated the importance of regular exercise and low salt diet   Plan:  Start taking nifedipine xl 30 mg once daily (pt agreeable as he cannot recall why he stopped) Stop valsartan for 3-4 days and have labs drawn (fasting) Thursday or Friday morning.  Restart valsartan once labs drawn. Check for hyperaldosteronism and pheochromocytoma Patient to keep record of BP readings with heart rate and report to [place] at the next visit Patient to see Dr. Oval Linsey in January for follow up  Reach out via MyChart if he has problems tolerating the nifedipine.

## 2022-06-24 ENCOUNTER — Other Ambulatory Visit: Payer: Self-pay | Admitting: Family Medicine

## 2022-06-24 DIAGNOSIS — E041 Nontoxic single thyroid nodule: Secondary | ICD-10-CM

## 2022-07-02 ENCOUNTER — Ambulatory Visit
Admission: RE | Admit: 2022-07-02 | Discharge: 2022-07-02 | Disposition: A | Discharge status: Home / Self Care | Payer: BC Managed Care – PPO | Source: Ambulatory Visit | Attending: Family Medicine | Admitting: Family Medicine

## 2022-07-02 DIAGNOSIS — E041 Nontoxic single thyroid nodule: Secondary | ICD-10-CM

## 2022-07-03 ENCOUNTER — Encounter (HOSPITAL_BASED_OUTPATIENT_CLINIC_OR_DEPARTMENT_OTHER): Payer: Self-pay

## 2022-07-09 ENCOUNTER — Ambulatory Visit (INDEPENDENT_AMBULATORY_CARE_PROVIDER_SITE_OTHER): Payer: BC Managed Care – PPO

## 2022-07-09 DIAGNOSIS — I10 Essential (primary) hypertension: Secondary | ICD-10-CM | POA: Diagnosis not present

## 2022-07-09 NOTE — Patient Instructions (Signed)
Medication Instructions:  Your Physician recommend you continue on your current medication as directed.    *If you need a refill on your cardiac medications before your next appointment, please call your pharmacy*   Lab Work: Your physician recommends that you return for lab work today- metanephrines and catecholamines  If you have labs (blood work) drawn today and your tests are completely normal, you will receive your results only by: [place] (if you have MyChart) OR A paper copy in the mail If you have any lab test that is abnormal or we need to change your treatment, we will call you to review the results.  Follow-Up: At [facility], you and your health needs are our priority.  As part of our continuing mission to provide you with exceptional heart care, we have created designated Provider Care Teams.  These Care Teams include your primary Cardiologist (physician) and Advanced Practice Providers (APPs -  Physician Assistants and Nurse Practitioners) who all work together to provide you with the care you need, when you need it.  We recommend signing up for the patient portal called "MyChart".  Sign up information is provided on this After Visit Summary.  MyChart is used to connect with patients for Virtual Visits (Telemedicine).  Patients are able to view lab/test results, encounter notes, upcoming appointments, etc.  Non-urgent messages can be sent to your provider as well.   To learn more about what you can do with MyChart, go to [email or web address].    Your next appointment:    Follow up as scheduled

## 2022-07-09 NOTE — Progress Notes (Signed)
   Nurse Visit   Date of Encounter: 07/09/2022 ID: AMAHD MORINO, DOB 04/12/1966, MRN [id number]  PCP:  Hayden Rasmussen, MD   [facility] Prov[facility] Cardiologist:  Skeet Latch, MD Cardiology APP:  Loel Dubonnet, NP      Visit Details   VS:  There were no vitals taken for this visit. , BMI There is no height or weight on file to calculate BMI.  Wt Readings from Last 3 Encounters:  06/23/22 182 lb (82.6 kg)  05/27/22 179 lb 9.6 oz (81.5 kg)  05/20/22 199 lb (90.3 kg)     Reason for visit: Repatha Injection  Performed today: Provider consulted:Caitlin Walker, NP and Education Changes (medications, testing, etc.) : No changes today, patient presents for Repatha injection and repeat labs.  Length of Visit: 7 minutes    Medications Adjustments/Labs and Tests Ordered: Orders Placed This Encounter  Procedures   Catecholamines, fractionated, plasma   Metanephrines, plasma   No orders of the defined types were placed in this encounter.    Signed, Gerald Stabs, RN  07/09/2022 1:20 PM

## 2022-07-11 LAB — ALDOSTERONE + RENIN ACTIVITY W/ RATIO
Aldos/Renin Ratio: >13.2 (ref 0.0–30.0)
Aldosterone: 2.2 ng/dL (ref 0.0–30.0)
Renin Activity, Plasma: <0.167 ng/mL/hr — ABNORMAL LOW (ref 0.167–5.380)

## 2022-07-11 LAB — METANEPHRINES, PLASMA
Metanephrine, Free: <25 pg/mL (ref 0.0–88.0)
Normetanephrine, Free: 66.8 pg/mL (ref 0.0–244.0)

## 2022-07-16 ENCOUNTER — Encounter: Payer: Self-pay | Admitting: Pharmacist Clinician (PhC)/ Clinical Pharmacy Specialist

## 2022-07-17 ENCOUNTER — Encounter (HOSPITAL_COMMUNITY): Payer: Self-pay

## 2022-07-18 ENCOUNTER — Encounter (HOSPITAL_BASED_OUTPATIENT_CLINIC_OR_DEPARTMENT_OTHER): Payer: Self-pay

## 2022-07-22 ENCOUNTER — Telehealth (HOSPITAL_COMMUNITY): Payer: Self-pay

## 2022-07-22 NOTE — Telephone Encounter (Signed)
Pt is not interested in the cardiac rehab after insurance benefits were done. Closed referral.

## 2022-07-24 LAB — CATECHOLAMINES, FRACTIONATED, PLASMA
Dopamine: <30 pg/mL (ref 0–48)
Epinephrine: 15 pg/mL (ref 0–62)
Norepinephrine: 174 pg/mL (ref 0–874)

## 2022-07-24 LAB — METANEPHRINES, PLASMA
Metanephrine, Free: 29.1 pg/mL (ref 0.0–88.0)
Normetanephrine, Free: 40.3 pg/mL (ref 0.0–244.0)

## 2022-08-04 ENCOUNTER — Ambulatory Visit (HOSPITAL_BASED_OUTPATIENT_CLINIC_OR_DEPARTMENT_OTHER): Payer: BC Managed Care – PPO | Admitting: Cardiovascular Disease

## 2022-08-04 ENCOUNTER — Encounter (HOSPITAL_BASED_OUTPATIENT_CLINIC_OR_DEPARTMENT_OTHER): Payer: Self-pay | Admitting: Cardiovascular Disease

## 2022-08-04 VITALS — BP 128/84 | HR 70 | Ht 72.0 in | Wt 184.4 lb

## 2022-08-04 DIAGNOSIS — I493 Ventricular premature depolarization: Secondary | ICD-10-CM

## 2022-08-04 DIAGNOSIS — D3500 Benign neoplasm of unspecified adrenal gland: Secondary | ICD-10-CM

## 2022-08-04 DIAGNOSIS — I2510 Atherosclerotic heart disease of native coronary artery without angina pectoris: Secondary | ICD-10-CM

## 2022-08-04 DIAGNOSIS — Z01812 Encounter for preprocedural laboratory examination: Secondary | ICD-10-CM

## 2022-08-04 DIAGNOSIS — I10 Essential (primary) hypertension: Secondary | ICD-10-CM | POA: Diagnosis not present

## 2022-08-04 NOTE — Assessment & Plan Note (Signed)
He underwent RCA PCI 05/2022.  He has no angina.  He has had multiple symptoms of memory loss, fatigue, and vision changes which predated his procedure but seem to be worse.  We will try holding his rosuvastatin.  He will need to have repeat lipids and a CMP in 2 to 3 months.  Continue aspirin, clopidogrel, Repatha, and Bystolic.

## 2022-08-04 NOTE — Assessment & Plan Note (Signed)
Stable on nebivolol.

## 2022-08-04 NOTE — Patient Instructions (Addendum)
Medication Instructions:  STOP ROSUVASTATIN   Labwork: BMET/CBC 1 WEEK PRIOR TO PROCEDURE   Testing/Procedures: WILL REACH OUT TO YOU ABOUT THE RENAL DENERVATION   Follow-Up: Overton Mam NP IN 1 MONTH   DR [facility] 11/06/2022 AT 9:45 AM   Any Other Special Instructions Will Be Listed Below (If Applicable). MONITOR YOUR BLOOD PRESSURE TWICE A DAY AND LOG. BRING MACHINE AND LOG TO FOLLOW UP IN 1 MONTH

## 2022-08-04 NOTE — Assessment & Plan Note (Signed)
Not physiologically active.

## 2022-08-04 NOTE — Progress Notes (Signed)
Cardiology Office Note  Date:  08/04/2022   ID:  Sean Mckay [birth year], MRN [id number]  PCP:  Hayden Rasmussen, MD  Cardiologist:   Skeet Latch, MD   No chief complaint on file.  History of Present Illness: Sean Mckay is a 57 y.o. male with CAD s/p RCA PCI, hypertension and hyperlipidemia, who presents for follow up.  Sean Mckay was first seen 12/2015 due to poorly-controlled hypertension.  He has struggled with poorly-controlled hypertension and intolerance to many medications.  He had renal artery Dopplers that were negative for renal artery stenosis.  He also had carotid Dopplers due to an episode of syncope that were negative for obstruction.  He also had a nuclear stress test that was negative for ischemia. He reports wearing a 48-hour Holter years ago that showed PACs and PVCs.  Of note, Sean Mckay has a brother who died of sudden cardiac death at age 71. No autopsy was performed.  Both his mother and father have had heart attacks and bypass surgery. Maternal grandfather had a heart attack at age 15.    Sean Mckay was started on HCTZ  12.5 mg daily.  However he did not tolerate this and was started on valsartan, which was subsequently increased to 80 mg due to poor BP control.  He developed fatigue and shortness of breath on nebivolol '20mg'$  daily, so this was reduced.  On 03/2016 he reported episodes of sharp, atypical chest pain.  Given this as well as his family history of CAD and borderline lipids, he was referred for cardiac CT-A.   He was found to have a coronary calcium score of 0.  His pulmonary artery was noted to be mildly dilated to 3.1 x 2.7 cm.   On 04/2018 Sean Mckay reported intermittent episodes of chest pain.   Sean Mckay followed up with his PCP regarding the symptoms.  She checked a chest x-ray that showed no acute findings but did note some old scarring in his left lung.  He thinks this is due to a prior pneumonia.  She also checked a d-dimer  that was normal.  Of note, he had a sleep study in 2017 that was negative for sleep apnea.  Given that there wa no CAD noted on coronary CT-A and he had a calcium score of 0 in 04/2016 it was felt that his symptoms were not related to ischemia.  He also reported palpitations so he was referred for 48-hour Holter monitor that showed PACs and PVCs but no significant arrhythmias.  He was referred for an echo 04/13/18 that showed normal systolic function and trivial TR but was otherwise unremarkable. He saw [name] 06/2018 and continued to have shortness of breath, so he was referred to pulmonary.   He was seen 11/2020 and reported palpitations. He also reported hypoxia and shortness of breath. He had chest pain but given his calcium score of 0 he was not thought to be ischemic.  He had a nuclear stress test 12/2020 that had significant artifact and was uninterpretable.  He developed a complication of his left hand fingers turning purple during the nuclear stress test.  He subsequently had discomfort.  He had arterial imaging of the right upper extremity that was unremarkable.  He had a coronary CT with moderate RCA disease and minimal LAD disease. FFRCT was negative. He wore a 6-day monitor with rare PVCs. He had a left heart cath 05/2022 and had 90% proximal to mid RCA stenosis and underwent  PCI.  He had an increase in palpitations which were thought to be due to his uncontrolled thyroid disease.  Nebivolol was increased to 10 mg and diltiazem was discontinued.  He had been working with his integrative medicine doctor to better control his thyroid.  Since working on this and increasing nebivolol, his palpitations had been much improved.  He followed up with the pharmacist 06/2022 and blood pressure was uncontrolled. Nifedipine was added. Labs were negative for hyperaldosteronism and pheochromocytoma.  Today, he states he is struggling with memory loss and other symptoms which he attributes to his recent stent  placement and medication changes. Last week he saw his PCP and reportedly returned to the clinic 3 times due to forgetting things. He also complains of intermittent speech difficulty, chronic headaches, vision disturbances with light sensitivity and lack of focus, and fatigue/malaise. Prior to the stent placement he did not have issues with memory, vision, or chronic headaches. However, he did have ongoing fatigue which was likely due to his blockage. He monitors his BP periodically. When he checks it is mostly elevated as high as 180/120, with an average of 160/100. Every once in a while he has low numbers such as 100/60, which he notes is near the end of his dosing cycle. His BP in clinic is well controlled. He has brought in his blood pressure machine and it was found to be accurate. Recently he was contacted to start cardiac rehab. Unfortunately he states it will be cost prohibitive. At home he is typically active and follows a good diet such as grilled fish and vegetables. He denies any palpitations, chest pain, shortness of breath, or peripheral edema. No syncope, orthopnea, or PND.  Recent thyroid labs:  T3 2.6, T4 0.9 TPO Ab 306, thyroglobiunlin 240  Lipid Panel: Total cholesterol 210, HDL 40, LDL 100, triglycerides 340  Adverse effects:  Amlodipine: syncope Lisinopril: dizziness Hydralazine: Muscle aches, fatigue Nifedipine: fatigue, shortness of breath HCTZ: thirst, cough, tremor Nebivolol 20 mg: fatigue, shortness of breath.  Bradycardia with '10mg'$    Past Medical History:  Diagnosis Date   Adrenal adenoma 05/09/2016   CAD in native artery 04/30/2021   Essential hypertension 05/09/2016   Hyperlipidemia    Hypertriglyceridemia 05/09/2016   Palpitations 12/06/2020   Pure hypercholesterolemia 12/06/2020    Past Surgical History:  Procedure Laterality Date   CORONARY STENT INTERVENTION N/A 05/20/2022   Procedure: CORONARY STENT INTERVENTION;  Surgeon: Belva Crome, MD;  Location:  [facility];  Service: Cardiovascular;  Laterality: N/A;   HERNIA REPAIR  02/22/09   RIH   LEFT HEART CATH AND CORONARY ANGIOGRAPHY N/A 05/20/2022   Procedure: LEFT HEART CATH AND CORONARY ANGIOGRAPHY;  Surgeon: Belva Crome, MD;  Location: [facility];  Service: Cardiovascular;  Laterality: N/A;    Current Outpatient Medications  Medication Sig Dispense Refill   aspirin EC 81 MG tablet Take 1 tablet (81 mg total) by mouth daily. Swallow whole. 90 tablet 3   Cholecalciferol (VITAMIN D3) 125 MCG (5000 UT) CAPS Take 5,000 Units by mouth daily.     clopidogrel (PLAVIX) 75 MG tablet Take 1 tablet (75 mg total) by mouth daily with breakfast. 90 tablet 3   Coenzyme Q10 100 MG capsule Take 100 mg by mouth daily.     cyanocobalamin (VITAMIN B12) 500 MCG tablet Take 500 mcg by mouth daily.     Evolocumab (REPATHA SURECLICK) 448 MG/ML SOAJ Inject 140 mg into the skin every 14 (fourteen) days. 2 mL  12   Krill Oil 500 MG CAPS Take 1-2 capsules by mouth See admin instructions. 2 caps in the morning and 1 caps in the evening     loperamide (IMODIUM A-D) 2 MG tablet Take 4 mg by mouth daily.     nebivolol (BYSTOLIC) 10 MG tablet Take 1 tablet (10 mg total) by mouth daily. 90 tablet 3   NP THYROID 90 MG tablet Take 90 mg by mouth daily.     valsartan (DIOVAN) 320 MG tablet Take 1 tablet (320 mg total) by mouth daily. 30 tablet 6   vitamin E 180 MG (400 UNITS) capsule Take 400 Units by mouth daily.     nitroGLYCERIN (NITROSTAT) 0.4 MG SL tablet Place 1 tablet (0.4 mg total) under the tongue every 5 (five) minutes as needed for chest pain. 25 tablet 3   No current facility-administered medications for this visit.    Allergies:   Ciprofloxacin, Flomax [tamsulosin], Hydralazine, Hydrochlorothiazide, Lisinopril, Mobic [meloxicam], Nifedipine, Norvasc [amlodipine besylate], Prednisone, Statins, Promethazine, and Vytorin [ezetimibe-simvastatin]    Social History:  The patient  reports that he  has never smoked. He has never used smokeless tobacco. He reports that he does not drink alcohol and does not use drugs.   Family History:  The patient's family history includes Aneurysm in his maternal grandmother; Cancer in his father and paternal grandmother; Heart attack in his father, maternal grandfather, and mother; Stroke in his maternal grandmother; Sudden death in his brother.    ROS:   Please see the history of present illness. (+) Memory loss (+) Speech difficulty (+) Chronic headaches (+) Visual disturbances, light sensitivity (+) Fatigue/Malaise All other systems are reviewed and negative.    PHYSICAL EXAM: VS:  BP 128/84 (BP Location: Left Arm, Patient Position: Sitting, Cuff Size: Normal)   Pulse 70   Ht 6' (1.829 m)   Wt 184 lb 6.4 oz (83.6 kg)   SpO2 94%   BMI 25.01 kg/m  , BMI Body mass index is 25.01 kg/m. GENERAL:  Well appearing HEENT: Pupils equal round and reactive, fundi not visualized, oral mucosa unremarkable NECK:  No jugular venous distention, waveform within normal limits, carotid upstroke brisk and symmetric, no bruits, no thyromegaly LUNGS:  Clear to auscultation bilaterally HEART:  RRR.  PMI not displaced or sustained,S1 and S2 within normal limits, no S3, no S4, no clicks, no rubs, no murmurs ABD:  Flat, positive bowel sounds normal in frequency in pitch, no bruits, no rebound, no guarding, no midline pulsatile mass, no hepatomegaly, no splenomegaly EXT:  2 plus pulses throughout, no edema, no cyanosis no clubbing SKIN:  No rashes no nodules NEURO:  Cranial nerves II through XII grossly intact, motor grossly intact throughout PSYCH:  Cognitively intact, oriented to person place and time   EKG:  EKG is personally reviewed. 08/04/2022:  EKG was not ordered. 04/29/2021: No EKG. 12/06/2020: Sinus bradycardia. Rate 59 bpm. 04/01/2018: Sinus bradycardia.  Rate 53 bpm.  Less than 1 mm ST elevation inferiorly, anteriorly, and laterally. 12/24/15: Sinus  bradycardia rate 54 bpm.  R axis deviation.    Carotid Ultrasound  06/13/2022: Summary:  Right Carotid: The extracranial vessels were near-normal with only minimal wall thickening or plaque.   Left Carotid: The extracranial vessels were near-normal with only minimal wall thickening or plaque.   Vertebrals:  Bilateral vertebral arteries demonstrate antegrade flow.  Subclavians: Normal flow hemodynamics were seen in bilateral subclavian arteries.    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 If               clinically indicated, a dedicated thyroid ultrasound is               recommended.   Left Heart Cath  05/20/2022: CONCLUSIONS: Segmental 80% proximal to mid RCA stenosis.  Mid to distal irregularities up to 30%. The proximal stenosis was treated with a 24 x 3.5 mm drug-eluting stent postdilated to 4.0 mm in diameter with TIMI grade III flow noted.  0% stenosis was noted. Left main coronary artery is widely patent. The LAD is transapical and contains no significant obstruction. Circumflex contains mid vessel irregularities up to 30 to 40%. LV function is normal with estimated EF 50 to 55%.  EDP 15 mmHg.   RECOMMENDATIONS: Clopidogrel and aspirin dual antiplatelet therapy for 6 months.  After 6 months okay to drop aspirin.  Continue clopidogrel indefinitely unless bleeding issues. Aggressive risk factor modification Patient having discomfort post procedure without objective evidence of ischemia on EKG, coronary angiography, and therefore will be admitted to the hospital for overnight stay.  Diagnostic Dominance: Right  Intervention    Coronary CTA  05/13/2022: IMPRESSION: 1. Coronary calcium score of 60.8. This was 4 percentile for age-, sex, and race-matched controls.   2. Normal coronary origin with right dominance.   3. Severe (70-99) mixed plaque stenosis in the proximal RCA.    4. Study will be sent for FFR.   RECOMMENDATIONS: CAD-RADS 4: Severe stenosis. (70-99% or > 50% left main). Cardiac catheterization or CT FFR is recommended. Consider symptom-guided anti-ischemic pharmacotherapy as well as risk factor modification per guideline directed care. Invasive coronary angiography recommended with revascularization per published guideline statements.  Stress Myoview  04/22/2022:   The study is normal. The study is low risk.   The patient reported dyspnea, dizziness, flushing and chest pain (atypical) during the stress test. Normal blood pressure and normal heart rate response noted during stress. Heart rate recovery was normal.   Baseline EKG demonstrated diffuse J point elevated consistent with early repolarization abnormality   No change from baseline early repolarization changes during infusion.   Left ventricular function is normal. Nuclear stress EF: 57 %. The left ventricular ejection fraction is normal (55-65%). End diastolic cavity size is normal. End systolic cavity size is normal.   Prior study available for comparison from 12/12/2020.  6-Day ZIO Monitor 02/01/2021 Quality: Fair.  Baseline artifact. Predominant rhythm: Sinus rhythm Average heart rate: 68 bpm Max heart rate: 143 bpm Min heart rate: 46 bpm Pauses >2.5 seconds: None Rare PVCs, ventricular couplets, and ventricular triplets.  UE Doppler 12/13/2020 Right: No significant arterial obstruction detected in the right         upper extremity.  Left: No significant arterial obstruction detected in the left upper        extremity.  [facility]  12/12/2020 The left ventricular ejection fraction is mildly decreased (45-54%). Nuclear stress EF: 52%. No T wave inversion was noted during stress. Study is very limited due to significant extracardiac tracer uptake. Suspect normal perfusion, however, interpretation difficult due to degree of artifact. Recommend alternative ischemic  evaluation if clinically indicated (cardiac MRI vs coronary CTA) given poor quality of current study.  UE Duplex 12/12/2020 Right: Normal flow seen in right mid ulnar and radial arteries.  Left: No obstruction visualized in the left upper extremity Limited        evaluation of  left forearm arteries show no evidence of        stenosis.  Echo 04/13/18: LVEF 55-60%.  Trivial TR.   48 Hour Event Monitor 04/13/18: Quality: Fair.  Baseline artifact. Predominant rhythm: sinus rhythm Average heart rate: 67 bpm Max heart rate: 120 bpm Min heart rate: 47 bpm  Rare PVCs and occasional PACs 3 beats of NSVT  Patient reported episodes of chest pain and jaw pain, at which time sinus rhythm was noted.  Carotid artery Doppler 12/05/15: 1-39% ICA stenosis bilaterally  Lexiscan Myoview 12/03/15:  LVEF 61%.  No ischemia.  Echo 12/03/15:  Normal LV size and function.  Mild MR, trace TR.    Recent Labs: 05/18/2022: ALT 14 05/22/2022: BUN 9; Creatinine, Ser 0.95; Hemoglobin 14.3; Magnesium 1.8; Platelets 287; Potassium 3.9; Sodium 138    Lipid Panel    Component Value Date/Time   CHOL 197 03/20/2022 0949   TRIG 227 (H) 03/20/2022 0949   HDL 45 03/20/2022 0949   CHOLHDL 4.4 03/20/2022 0949   LDLCALC 113 (H) 03/20/2022 0949   12/02/15: Total cholesterol 199, triglycerides 180 HDL 36, LDL 126  12/18:  TC 246 197 LDL, HDL 36, tri 194    Wt Readings from Last 3 Encounters:  08/04/22 184 lb 6.4 oz (83.6 kg)  06/23/22 182 lb (82.6 kg)  05/27/22 179 lb 9.6 oz (81.5 kg)     ASSESSMENT AND PLAN:  PVC's (premature ventricular contractions) Stable on nebivolol.   Essential hypertension Blood pressure remains uncontrolled.  He has been intolerant to many medications.  He has previously been on hydralazine, HCTZ, lisinopril, nifedipine and amlodipine.  He notes increased neurological symptoms and is concerned that this may be due to increased dose of valsartan.  He continues to exercise regularly  and has a healthy diet.  Evaluation of secondary causes was negative for hyperaldosteronism and pheochromocytoma.  Thyroid function has been normal.  CTA of the abdomen in 2021 was negative for renal artery stenosis.  I think he would be a good candidate for renal artery denervation.  Blood pressures at home have been even higher than they are here.  He is going to track his blood pressures at home twice a day and bring to follow-up.  Continue nebivolol and valsartan.  Consider trying doxazosin.  CAD in native artery He underwent RCA PCI 05/2022.  He has no angina.  He has had multiple symptoms of memory loss, fatigue, and vision changes which predated his procedure but seem to be worse.  We will try holding his rosuvastatin.  He will need to have repeat lipids and a CMP in 2 to 3 months.  Continue aspirin, clopidogrel, Repatha, and Bystolic.  Adrenal adenoma Not physiologically active.  Plan -Discussed renal denervation. -Will hold rosuvastatin to see if his symptoms improve. -Non-contrast head CT may be cost-prohibitive. First we will reevaluate at follow-up after holding his statin. -Asked him to record a blood pressure log and bring to follow-up. -Move to Hypertension clinic  Disposition:    FU with APP in 1 month. FU with Yaniris Braddock C. Oval Linsey, MD, [facility] in 3-4 months.   Medication Adjustments/Labs and Tests Ordered: Current medicines are reviewed at length with the patient today.  Concerns regarding medicines are outlined above.   Orders Placed This Encounter  Procedures   CBC with Differential/Platelet   Basic metabolic panel   No orders of the defined types were placed in this encounter.  Patient Instructions  Medication Instructions:  STOP ROSUVASTATIN   Labwork: BMET/CBC  1 WEEK PRIOR TO PROCEDURE   Testing/Procedures: WILL REACH OUT TO YOU ABOUT THE RENAL DENERVATION   Follow-Up: Overton Mam NP IN 1 MONTH   DR [facility] 11/06/2022 AT 9:45 AM   Any Other Special  Instructions Will Be Listed Below (If Applicable). MONITOR YOUR BLOOD PRESSURE TWICE A DAY AND LOG. BRING MACHINE AND LOG TO FOLLOW UP IN 1 MONTH   I,Mathew Stumpf,acting as a scribe for Skeet Latch, MD.,have documented all relevant documentation on the behalf of Skeet Latch, MD,as directed by  Skeet Latch, MD while in the presence of Skeet Latch, MD.   Signed, [place] Oval Linsey, MD, [facility]  08/04/2022 10:57 AM    [place]

## 2022-08-04 NOTE — Assessment & Plan Note (Signed)
Blood pressure remains uncontrolled.  He has been intolerant to many medications.  He has previously been on hydralazine, HCTZ, lisinopril, nifedipine and amlodipine.  He notes increased neurological symptoms and is concerned that this may be due to increased dose of valsartan.  He continues to exercise regularly and has a healthy diet.  Evaluation of secondary causes was negative for hyperaldosteronism and pheochromocytoma.  Thyroid function has been normal.  CTA of the abdomen in 2021 was negative for renal artery stenosis.  I think he would be a good candidate for renal artery denervation.  Blood pressures at home have been even higher than they are here.  He is going to track his blood pressures at home twice a day and bring to follow-up.  Continue nebivolol and valsartan.  Consider trying doxazosin.

## 2022-09-05 ENCOUNTER — Encounter (HOSPITAL_BASED_OUTPATIENT_CLINIC_OR_DEPARTMENT_OTHER): Payer: Self-pay | Admitting: Family

## 2022-09-05 ENCOUNTER — Ambulatory Visit (HOSPITAL_BASED_OUTPATIENT_CLINIC_OR_DEPARTMENT_OTHER): Payer: BC Managed Care – PPO | Admitting: Family

## 2022-09-05 VITALS — BP 150/94 | HR 65 | Ht 72.0 in | Wt 182.0 lb

## 2022-09-05 DIAGNOSIS — I6523 Occlusion and stenosis of bilateral carotid arteries: Secondary | ICD-10-CM | POA: Diagnosis not present

## 2022-09-05 DIAGNOSIS — I10 Essential (primary) hypertension: Secondary | ICD-10-CM

## 2022-09-05 DIAGNOSIS — I493 Ventricular premature depolarization: Secondary | ICD-10-CM | POA: Diagnosis not present

## 2022-09-05 DIAGNOSIS — I25118 Atherosclerotic heart disease of native coronary artery with other forms of angina pectoris: Secondary | ICD-10-CM | POA: Diagnosis not present

## 2022-09-05 DIAGNOSIS — R002 Palpitations: Secondary | ICD-10-CM

## 2022-09-05 MED ORDER — DOXAZOSIN MESYLATE 2 MG PO TABS: ORAL_TABLET | ORAL | 5 refills | Status: DC

## 2022-09-05 NOTE — Progress Notes (Unsigned)
Office Visit    Patient Name: Sean Mckay Date of Encounter: 09/05/2022  PCP:  Hayden Rasmussen, MD   [place]  Cardiologist:  Skeet Latch, MD  Advanced Practice Provider:  Loel Dubonnet, NP Electrophysiologist:  None   Chief Complaint    Sean Mckay is a 57 y.o. male presents today for follow up of blood pressure.   Past Medical History    Past Medical History:  Diagnosis Date   Adrenal adenoma 05/09/2016   CAD in native artery 04/30/2021   Essential hypertension 05/09/2016   Hyperlipidemia    Hypertriglyceridemia 05/09/2016   Palpitations 12/06/2020   Pure hypercholesterolemia 12/06/2020   Past Surgical History:  Procedure Laterality Date   CORONARY STENT INTERVENTION N/A 05/20/2022   Procedure: CORONARY STENT INTERVENTION;  Surgeon: Belva Crome, MD;  Location: [facility];  Service: Cardiovascular;  Laterality: N/A;   HERNIA REPAIR  02/22/09   RIH   LEFT HEART CATH AND CORONARY ANGIOGRAPHY N/A 05/20/2022   Procedure: LEFT HEART CATH AND CORONARY ANGIOGRAPHY;  Surgeon: Belva Crome, MD;  Location: [facility];  Service: Cardiovascular;  Laterality: N/A;    Allergies  Allergies  Allergen Reactions   Ciprofloxacin     Headache, Impairment, Felt Sluggish    Flomax [Tamsulosin]     Weakness, Dizziness, Impairment    Hydralazine Other (See Comments)    Fatigue and muscle/joint pain    Hydrochlorothiazide Diarrhea and Other (See Comments)    Tremors, fatigue, cough   Lisinopril Other (See Comments)    Dizziness    Mobic [Meloxicam]     Increased BP    Nifedipine     Pt does not remember reaction    Norvasc [Amlodipine Besylate]     dizziness   Prednisone Other (See Comments)    "vision problems" Blurred vision Visual problem when taken orally    Statins Other (See Comments)    Myalgias (intolerance)   Promethazine Nausea Only        Vytorin [Ezetimibe-Simvastatin] Diarrhea and Rash     Muscle aches     History of Present Illness    Sean Mckay is a 57 y.o. male with a hx of HTN, HLD, palpitations, CAD, aortic atherosclerosis  last seen while admitted.  He has established with Dr. Oval Linsey due to poorly controlled hypotension and multiple medication intolerances. Previous renal artery dopplers with no evidence of renal artery stenosis. Previous carotid dopplers due to syncope with no obstruction. Nuclear stress test negative for ischemia. Previous holter monitor years ago with PAC's and PVCs. Cardiac CTA 2017 with coronary calcium score of 0 and pulmonary artery mildly dilated at 3.1x2.7 cm. Sleep study 2017 no evidence of OSA. 48-hour holter with PAC and PVC with no significant arrhythmia. Echo 04/13/18 normal LVEF, trivial TR.   Previous intolerance to HCTZ, increased doses of Nebivolol, Zetia, Amlodipine (syncope), Lisinopril (dizziness), Nifedipine (fatigue, SOB).  May 2022 presented with lightheadedness, palpitations, presyncope, shortness of breath. Lexiscan myoview was poor quality study. Subsequent cardiac CTA with stable small hiatal hernia, coronary calcium score of 24.9 plaicng him in 65th percentile, moderate (50-69%) mixed plaque stenosis in prox RCA - overall CAD-RADS 3 moderate stenosis. He contacted the office noting palpitations and wore ZIO for 6 days. Average heart rate 68bpm. Predominantly NSR. PAC and PVC with <1% burden, no significant arrhythmia. He had 55 triggered events during wear time which were predominantly associated with NSR or PVCs.  Seen 08/2021 doing overall well from a cardiac perspective.  April 2023 he had bilateral inguinal hernia surgery.Seen 01/20/22 with his husband doing well. Noted increased myalgias with increased Rosuvastatin. He was prescribed Repatha but never stopped it. 11/2021 LDL 122 and repeat 03/20/22 after Repatha LDL 113. Seen 04/15/22 noting chest discomfort with subsequent myoview 04/15/22 no ischemia. Given persistent chest  discomfort was recommended for LHC, but preferred to start with cardiac CTA. Amlodipine trialed but did not tolerate due to dizziness. Cardiac CTA 05/13/22 with coronary calcium score 60.8 placing him in 71st percentile with severe mixed plaque in RCA positive by FFR.  Subsequent LHC 2022/06/01 with DES-RCA. Had post procedure chest pain and was admitted for observation. He was started on Imdur '15mg'$  daily and Cialis/Viagra held. Losartan, Bystolic, Repatha, Rosuvastatin were continued.   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 Since hospital discharge he has self discontinued Imdur due to headache and hypotension. Has not yet started Repatha due to being uncomfortable with injections. He notes that he has cystoscopy on 05/30/2022 due to bladder concerns.   He notes that his chest pain has improved since catheterization. He will experience a pinch feeling in his chest that last less than a second. He overall notes general fatigue that is bothering him the most today. He plans to attend cardiac rehab. He notes that his right arm is painful since his cath where bruising noted though bruising has been improving.  Reports no shortness of breath nor dyspnea on exertion. Reports no edema, orthopnea, PND. Reports no palpitations, syncope, lightheadedness.    *** Valsartan and Nebivolol were continued. Rosuvastatin was held due to reports of worsened memory loss, fatigue, vision changes.  ?non contrast head CT. ?Doxazosin.   He presents today for follow up independently. *** takes both his Valsartan and Nebivolol   Still reports poor vision, headache, dizziness, nightmares, anxiety, forgetfulness. Reports dull nagging headache most of the time and takes Tylenol occasionally which helps "take the edge off". Notes symptoms have stayed about the same. Notes vision might be a tad worse. Has not seem any improvement with discontinuation of Rosuvastatin.   Notes thing changed later in the  year.  He wonders if the changes are associated with his increased dose of Repatha or ***  Does not see much of a benefit of increased dose Valsartan  He feels it is medication related.   No chest pain, pressure, tightness.     EKGs/Labs/Other Studies Reviewed:   The following studies were reviewed today: LHC 2022/06/01  CONCLUSIONS: Segmental 80% proximal to mid RCA stenosis.  Mid to distal irregularities up to 30%. The proximal stenosis was treated with a 24 x 3.5 mm drug-eluting stent postdilated to 4.0 mm in diameter with TIMI grade III flow noted.  0% stenosis was noted. Left main coronary artery is widely patent. The LAD is transapical and contains no significant obstruction. Circumflex contains mid vessel irregularities up to 30 to 40%. LV function is normal with estimated EF 50 to 55%.  EDP 15 mmHg.   RECOMMENDATIONS:   Clopidogrel and aspirin dual antiplatelet therapy for 6 months.  After 6 months okay to drop aspirin.  Continue clopidogrel indefinitely unless bleeding issues. Aggressive risk factor modification Patient having discomfort post procedure without objective evidence of ischemia on EKG, coronary angiography, and therefore will be admitted to the hospital for overnight stay.    Diagnostic Dominance: Right  Intervention     Myoview 04/22/22   The study is  normal. The study is low risk.   The patient reported dyspnea, dizziness, flushing and chest pain (atypical) during the stress test. Normal blood pressure and normal heart rate response noted during stress. Heart rate recovery was normal.   Baseline EKG demonstrated diffuse J point elevated consistent with early repolarization abnormality   No change from baseline early repolarization changes during infusion.   Left ventricular function is normal. Nuclear stress EF: 57 %. The left ventricular ejection fraction is normal (55-65%). End diastolic cavity size is normal. End systolic cavity size is normal.    Prior study available for comparison from 12/12/2020.  Cardiac CTA 01/08/21 FINDINGS: Image quality: Good   Coronary Arteries:  Normal coronary origin.  Right dominance.   Left main: The left main is a large caliber vessel with a normal take off from the left coronary cusp that trifurcates into a LAD, LCX, and ramus intermedius. There is no plaque or stenosis.   Left anterior descending artery: The LAD has minimal (0-24%) soft plaque in the proximal vessel.   Ramus intermedius: Branching vessel; patent with no evidence of plaque or stenosis.   Left circumflex artery: The LCX is non-dominant and patent with no evidence of plaque or stenosis. The LCX gives off 2 large patent obtuse marginal branches.   Right coronary artery: The RCA is dominant with normal take off from the right coronary cusp. There is moderate (50-69%) mixed plaque stenosis in the proximal vessel. The RCA terminates as a small PDA and right posterolateral branch.   Right Atrium: Right atrial size is within normal limits.   Right Ventricle: The right ventricular cavity is within normal limits.   Left Atrium: Left atrial size is normal in size with no left atrial appendage filling defect.   Left Ventricle: The ventricular cavity size is within normal limits. There are no stigmata of prior infarction. There is no abnormal filling defect.   Pulmonary veins: Normal pulmonary venous drainage.   Pericardium: Normal thickness with no significant effusion or calcium present.   Cardiac valves: The aortic valve is trileaflet without significant calcification. The mitral valve is normal structure without significant calcification.   Aorta: Normal caliber with no significant disease.   Extra-cardiac findings: See attached radiology report for non-cardiac structures.   IMPRESSION: 1. Coronary calcium score of 24.9. This was 26 percentile for age-, sex, and race-matched controls.   2. Normal coronary origin  with right dominance.   3. Moderate (50-69%) mixed plaque stenosis in the proximal RCA.   4. Study will be sent for FFR.   RECOMMENDATIONS: CAD-RADS 3: Moderate stenosis. Consider symptom-guided anti-ischemic pharmacotherapy as well as risk factor modification per guideline directed care. Additional analysis with CT FFR will be submitted.   FINDINGS: Image quality: Good   Coronary Arteries:  Normal coronary origin.  Right dominance.   Left main: The left main is a large caliber vessel with a normal take off from the left coronary cusp that trifurcates into a LAD, LCX, and ramus intermedius. There is no plaque or stenosis.   Left anterior descending artery: The LAD has minimal (0-24%) soft plaque in the proximal vessel.   Ramus intermedius: Branching vessel; patent with no evidence of plaque or stenosis.   Left circumflex artery: The LCX is non-dominant and patent with no evidence of plaque or stenosis. The LCX gives off 2 large patent obtuse marginal branches.   Right coronary artery: The RCA is dominant with normal take off from the right coronary cusp. There is moderate (  50-69%) mixed plaque stenosis in the proximal vessel. The RCA terminates as a small PDA and right posterolateral branch.   Right Atrium: Right atrial size is within normal limits.   Right Ventricle: The right ventricular cavity is within normal limits.   Left Atrium: Left atrial size is normal in size with no left atrial appendage filling defect.   Left Ventricle: The ventricular cavity size is within normal limits. There are no stigmata of prior infarction. There is no abnormal filling defect.   Pulmonary veins: Normal pulmonary venous drainage.   Pericardium: Normal thickness with no significant effusion or calcium present.   Cardiac valves: The aortic valve is trileaflet without significant calcification. The mitral valve is normal structure without significant calcification.   Aorta:  Normal caliber with no significant disease.   Extra-cardiac findings: See attached radiology report for non-cardiac structures.   IMPRESSION: 1. Coronary calcium score of 24.9. This was 63 percentile for age-, sex, and race-matched controls.   2. Normal coronary origin with right dominance.   3. Moderate (50-69%) mixed plaque stenosis in the proximal RCA.   4. Study will be sent for FFR.   RECOMMENDATIONS: CAD-RADS 3: Moderate stenosis. Consider symptom-guided anti-ischemic pharmacotherapy as well as risk factor modification per guideline directed care. Additional analysis with CT FFR will be submitted.    1. Left Main: findings 0.99, 0.99 0.99   2. LAD: findings 0.97, 0.91 0.86   3. LCX: findings 0.99, 0.85 0.86; OM2: 0.84, 0.80, 0.77   4. Ramus: findings 0.98, 0.97 0.95   5. RCA: findings 0.98, 0.87 0.86   IMPRESSION: FFR abnormal in very distal OM2 but otherwise normal; medical therapy likely best option.  Lexiscan Myoview 12/03/15: LVEF 61%.  No ischemia.   Carotid artery Doppler 12/05/15: 1-39% ICA stenosis bilaterally   Echo 12/03/15: Normal LV size and function.  Mild MR, trace TR.   Echo 04/13/18: LVEF 55-60%.  Trivial TR.   48 Hour Event Monitor 04/13/18:   Quality: Fair.  Baseline artifact. Predominant rhythm: sinus rhythm Average heart rate: 67 bpm Max heart rate: 120 bpm Min heart rate: 47 bpm   Rare PVCs and occasional PACs 3 beats of NSVT   Patient reported episodes of chest pain and jaw pain, at which time sinus rhythm was noted.  EKG: EKG is not ordered today.  Recent Labs: 05/18/2022: ALT 14 05/22/2022: BUN 9; Creatinine, Ser 0.95; Hemoglobin 14.3; Magnesium 1.8; Platelets 287; Potassium 3.9; Sodium 138  Recent Lipid Panel    Component Value Date/Time   CHOL 197 03/20/2022 0949   TRIG 227 (H) 03/20/2022 0949   HDL 45 03/20/2022 0949   CHOLHDL 4.4 03/20/2022 0949   LDLCALC 113 (H) 03/20/2022 0949    Home Medications   Current Meds   Medication Sig   aspirin EC 81 MG tablet Take 1 tablet (81 mg total) by mouth daily. Swallow whole.   Cholecalciferol (VITAMIN D3) 125 MCG (5000 UT) CAPS Take 5,000 Units by mouth daily.   clopidogrel (PLAVIX) 75 MG tablet Take 1 tablet (75 mg total) by mouth daily with breakfast.   Coenzyme Q10 100 MG capsule Take 100 mg by mouth daily.   cyanocobalamin (VITAMIN B12) 500 MCG tablet Take 500 mcg by mouth daily.   Evolocumab (REPATHA SURECLICK) XX123456 MG/ML SOAJ Inject 140 mg into the skin every 14 (fourteen) days.   Krill Oil 500 MG CAPS Take 1-2 capsules by mouth See admin instructions. 2 caps in the morning and 1 caps in the evening  loperamide (IMODIUM A-D) 2 MG tablet Take 4 mg by mouth daily.   nebivolol (BYSTOLIC) 10 MG tablet Take 1 tablet (10 mg total) by mouth daily.   NP THYROID 90 MG tablet Take 90 mg by mouth daily.   valsartan (DIOVAN) 320 MG tablet Take 1 tablet (320 mg total) by mouth daily.   vitamin E 180 MG (400 UNITS) capsule Take 400 Units by mouth daily.     Review of Systems   All other systems reviewed and are otherwise negative except as noted above.  Physical Exam    VS:  BP (!) 150/94   Pulse 65   Ht 6' (1.829 m)   Wt 182 lb (82.6 kg)   BMI 24.68 kg/m  , BMI Body mass index is 24.68 kg/m.  Wt Readings from Last 3 Encounters:  09/05/22 182 lb (82.6 kg)  08/04/22 184 lb 6.4 oz (83.6 kg)  06/23/22 182 lb (82.6 kg)    GEN: Well nourished, well developed, in no acute distress. HEENT: normal. Neck: Supple, no JVD, carotid bruits, or masses. Cardiac: RRR, no murmurs, rubs, or gallops. No clubbing, cyanosis, edema.  Radials/PT 2+ and equal bilaterally.  Respiratory:  Respirations regular and unlabored, clear to auscultation bilaterally. GI: Soft, nontender, nondistended. MS: No deformity or atrophy. Skin: Warm and dry, no rash. Neuro:  Strength and sensation are intact. Psych: Normal affect.  Assessment & Plan    CAD s/p DES - RCA / Aortic  atherosclerosis - 05/20/22 DES to RCA. Right arm/wrist healing well w/o hematoma. Has self d/c Imdur due to hypotension, headache. Chest discomfort is atypical with no exertional pain. No need for ischemic evaluation at this time. GDMT includes Nebivolol, Rosuvastatin, Plavix,  ASA, PRN Nitroglycerin. Plan to d/c ASA after 6 months.   PVC - ZIO monitor 01/2021 with predominantly NSR.Stable on Nebivolol.  HTN -  BP 130s/80s at home, 130/92 in clinic today. He will monitor at home for 2 weeks and report if BP consistently >130/80. If noted, could increase dose Valsartan to '320mg'$ . Previously did not tolerate Amlodipine due to dizziness and Imdur due to hypotension.  Previously on Hydralazine, HCTZ, Lisinopril, Nifedipine, Amlodipine.  No prior auth Sean Mckay added billing to message - not sure if he heard from billing. $1200 deductible Workup for hyperaldosteronism, pheochromocytoma unremarkable. Thyroid function normal. CTA 2021 negative renal artery stenosis.   Carotid stenosis - 11/2015 carotid duplex bilateral 1-39% stenosis. Carotid duplex 06/13/22 no stenosis.  No dizziness/lightheadedness/syncope.   HLD, LDL goal <70 -  04/2021 LDL 130. 03/2022 LDL 113. 05/21/22 Lpa 195.3. Tolerates rosuvastatin 5 mg 3 times per week.  Did not tolerate increased dose. Has Repatha but not yet started, concerned about injections. Will have him come for nurse visit to assist with first injection next week.   ED - Continue to follow with PCP, urology. As he has discontinued Imdur, could utilize Tadalafil/Sildenafil if needed. Nitroglycerin precautions given.   Fatigue: Most likely related to deconditioning post catheterization. Plan for cardiac rehab for exercise and strength training.     Disposition: Follow up as scheduled with Dr. Oval Linsey.  Signed, Loel Dubonnet, NP 09/05/2022, 8:51 AM [place]

## 2022-09-05 NOTE — Patient Instructions (Addendum)
Medication Instructions:  Your physician has recommended you make the following change in your medication:   REDUCE Valsartan to half tablet ('160mg'$ ) daily  START Doxazosin '2mg'$  as needed once daily for systolic blood pressure greater than 180 If you check your blood pressure twice 15-20 minutes apart and both readings are more than 180, please take Doxazosin   *If you need a refill on your cardiac medications before your next appointment, please call your pharmacy*  Lab Work/Testing/Procedures: None ordered  Follow-Up: At [facility], you and your health needs are our priority.  As part of our continuing mission to provide you with exceptional heart care, we have created designated Provider Care Teams.  These Care Teams include your primary Cardiologist (physician) and Advanced Practice Providers (APPs -  Physician Assistants and Nurse Practitioners) who all work together to provide you with the care you need, when you need it.  We recommend signing up for the patient portal called "MyChart".  Sign up information is provided on this After Visit Summary.  MyChart is used to connect with patients for Virtual Visits (Telemedicine).  Patients are able to view lab/test results, encounter notes, upcoming appointments, etc.  Non-urgent messages can be sent to your provider as well.   To learn more about what you can do with MyChart, go to [email or web address].    Your next appointment:   As scheduled Other Instructions  We will reach out to Billing to get an estimate on renal denervation  Loel Dubonnet, NP will send you a message Tuesday to check in on blood pressure and in one month to check in on other symptoms to see if they are improved after medication changes.    Recommend scheduling an eye doctor appointment

## 2022-09-06 ENCOUNTER — Encounter (HOSPITAL_BASED_OUTPATIENT_CLINIC_OR_DEPARTMENT_OTHER): Payer: Self-pay | Admitting: Family

## 2022-09-07 ENCOUNTER — Encounter (HOSPITAL_BASED_OUTPATIENT_CLINIC_OR_DEPARTMENT_OTHER): Payer: Self-pay

## 2022-09-08 ENCOUNTER — Telehealth: Payer: Self-pay | Admitting: *Deleted

## 2022-09-08 NOTE — Telephone Encounter (Signed)
Patient called and he would like to proceed with the procedure but wants an estimate prior. Message has been sent to billing.

## 2022-09-08 NOTE — Telephone Encounter (Signed)
Patient is returning call.  °

## 2022-09-08 NOTE — Telephone Encounter (Signed)
Left a message for the patient to call back to set him up for the Renal Denervation procedure.

## 2022-09-12 NOTE — Telephone Encounter (Signed)
Left a message for the patient to call back.  

## 2022-09-13 ENCOUNTER — Other Ambulatory Visit: Payer: Self-pay

## 2022-09-13 ENCOUNTER — Encounter (HOSPITAL_BASED_OUTPATIENT_CLINIC_OR_DEPARTMENT_OTHER): Payer: Self-pay

## 2022-09-13 ENCOUNTER — Emergency Department (HOSPITAL_BASED_OUTPATIENT_CLINIC_OR_DEPARTMENT_OTHER): Payer: BC Managed Care – PPO | Admitting: Radiology

## 2022-09-13 ENCOUNTER — Emergency Department (HOSPITAL_BASED_OUTPATIENT_CLINIC_OR_DEPARTMENT_OTHER)
Admission: EM | Admit: 2022-09-13 | Discharge: 2022-09-13 | Disposition: A | Discharge status: Home / Self Care | Payer: BC Managed Care – PPO | Attending: Emergency Medicine | Admitting: Emergency Medicine

## 2022-09-13 ENCOUNTER — Encounter (HOSPITAL_BASED_OUTPATIENT_CLINIC_OR_DEPARTMENT_OTHER): Payer: Self-pay | Admitting: Emergency Medicine

## 2022-09-13 ENCOUNTER — Emergency Department (HOSPITAL_BASED_OUTPATIENT_CLINIC_OR_DEPARTMENT_OTHER): Payer: BC Managed Care – PPO

## 2022-09-13 DIAGNOSIS — Z7902 Long term (current) use of antithrombotics/antiplatelets: Secondary | ICD-10-CM | POA: Diagnosis not present

## 2022-09-13 DIAGNOSIS — Z7982 Long term (current) use of aspirin: Secondary | ICD-10-CM | POA: Insufficient documentation

## 2022-09-13 DIAGNOSIS — R519 Headache, unspecified: Secondary | ICD-10-CM | POA: Diagnosis not present

## 2022-09-13 DIAGNOSIS — I2510 Atherosclerotic heart disease of native coronary artery without angina pectoris: Secondary | ICD-10-CM | POA: Diagnosis not present

## 2022-09-13 DIAGNOSIS — M542 Cervicalgia: Secondary | ICD-10-CM | POA: Diagnosis present

## 2022-09-13 DIAGNOSIS — Z955 Presence of coronary angioplasty implant and graft: Secondary | ICD-10-CM | POA: Diagnosis not present

## 2022-09-13 DIAGNOSIS — S161XXA Strain of muscle, fascia and tendon at neck level, initial encounter: Secondary | ICD-10-CM | POA: Diagnosis not present

## 2022-09-13 DIAGNOSIS — Y92410 Unspecified street and highway as the place of occurrence of the external cause: Secondary | ICD-10-CM | POA: Diagnosis not present

## 2022-09-13 MED ORDER — IBUPROFEN 800 MG PO TABS
800.0000 mg | ORAL_TABLET | Freq: Once | ORAL | Status: AC
  Administered 2022-09-13: 800 mg via ORAL
  Filled 2022-09-13: qty 1

## 2022-09-13 MED ORDER — CYCLOBENZAPRINE HCL 10 MG PO TABS: 10.0000 mg | ORAL_TABLET | Freq: Two times a day (BID) | ORAL | 0 refills | Status: DC | PRN
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
## 2022-09-13 NOTE — ED Triage Notes (Signed)
Pt is on plavix
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
## 2022-09-15 NOTE — Telephone Encounter (Signed)
See MyChart message

## 2022-09-24 ENCOUNTER — Encounter: Payer: Self-pay | Admitting: Neurology

## 2022-09-24 ENCOUNTER — Ambulatory Visit: Payer: BC Managed Care – PPO | Admitting: Neurology

## 2022-09-24 VITALS — BP 131/85 | HR 70 | Ht 72.0 in | Wt 181.0 lb

## 2022-09-24 DIAGNOSIS — S069X0A Unspecified intracranial injury without loss of consciousness, initial encounter: Secondary | ICD-10-CM

## 2022-09-24 DIAGNOSIS — S060X0A Concussion without loss of consciousness, initial encounter: Secondary | ICD-10-CM

## 2022-09-24 DIAGNOSIS — R202 Paresthesia of skin: Secondary | ICD-10-CM

## 2022-09-24 DIAGNOSIS — S134XXA Sprain of ligaments of cervical spine, initial encounter: Secondary | ICD-10-CM

## 2022-09-24 DIAGNOSIS — S069XAA Unspecified intracranial injury with loss of consciousness status unknown, initial encounter: Secondary | ICD-10-CM | POA: Insufficient documentation

## 2022-09-24 DIAGNOSIS — R200 Anesthesia of skin: Secondary | ICD-10-CM | POA: Diagnosis not present
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
## 2022-09-24 NOTE — Patient Instructions (Signed)
Cervicogenic Headache  In a cervicogenic headache, the pain moves from your neck to your head. Most cervicogenic headaches start in the upper part of the neck with the first three cervical bones (cervical vertebrae). What are the causes? The most common cause of this condition is a traumatic injury to the bones and tissues in your neck (cervical spine). Whiplash is an example of a cervical spine injury. Other causes include: Arthritis. Broken bone (fracture). Infection. Tumor. What are the signs or symptoms? The most common symptoms are neck and head pain. The pain is often located on one side. In some cases, there may be head pain without neck pain. Pain may be felt in the neck, back or side of the head, face, or behind the eyes. Other symptoms include: Limited movement in the neck. Arm or shoulder pain. How is this diagnosed? This condition may be diagnosed based on: Your symptoms. A physical exam. An injection that blocks nerve signals (diagnostic nerve block). Imaging tests, such as: X-rays. CT scan. MRI. A cervicogenic headache is diagnosed when a cause can be found in the cervical spine and other causes of headaches can be ruled out. How is this treated? Treatment for this condition may depend on the underlying condition. Treatment may include: Medicines, such as: NSAIDs, such as ibuprofen. Muscle relaxants. Physical therapy. Massage therapy. Complementary therapies, such as: Biofeedback. Meditation. Acupuncture. Nerve block injections to reduce the pain. Botulinum toxin injections. Your treatment plan may involve working with a pain management team that includes your primary health care provider, a pain management specialist, a neurologist, and a physical therapist. Follow these instructions at home: Take over-the-counter and prescription medicines only as told by your health care provider. Do exercises at home as told by your physical therapist. Return to your normal  activities as told by your health care provider. Ask your health care provider what activities are safe for you. Avoid activities that trigger your headaches. Maintain good neck support and posture at home and at work. Keep all follow-up visits. This is important. Contact a health care provider if: You have headaches that are getting worse and happening more often. You have headaches with any of the following: Fever. Numbness. Weakness. Dizziness. Nausea or vomiting. Get help right away if: You have a sudden and severe headache. This symptom may be an emergency. Get help right away. Call 911. Do not wait to see if this symptom will go away. Do not drive yourself to the hospital. Summary A cervicogenic headache is a headache caused by a condition that affects the bones and tissues in your cervical spine. Your health care provider may diagnose this condition with a physical exam, a diagnostic nerve block, and imaging tests. Treatment may include medicine to reduce pain and inflammation, physical therapy, and nerve block injections. Complementary therapies, such as acupuncture and meditation, may be added to other treatments. Your treatment plan may involve working with a pain management team that includes your primary health care provider, a pain management specialist, a neurologist, and a physical therapist. This information is not intended to replace advice given to you by your health care provider. Make sure you discuss any questions you have with your health care provider. Document Revised: 01/03/2021 Document Reviewed: 01/03/2021 Elsevier Patient Education  2023 Elsevier Inc.  

## 2022-09-25 ENCOUNTER — Encounter (HOSPITAL_BASED_OUTPATIENT_CLINIC_OR_DEPARTMENT_OTHER): Payer: Self-pay

## 2022-09-25 LAB — COMPREHENSIVE METABOLIC PANEL
ALT: 15 IU/L (ref 0–44)
AST: 17 IU/L (ref 0–40)
Albumin/Globulin Ratio: 1.4 (ref 1.2–2.2)
Albumin: 4.3 g/dL (ref 3.8–4.9)
Alkaline Phosphatase: 50 IU/L (ref 44–121)
BUN/Creatinine Ratio: 8 — ABNORMAL LOW (ref 9–20)
BUN: 8 mg/dL (ref 6–24)
Bilirubin Total: 0.4 mg/dL (ref 0.0–1.2)
CO2: 25 mmol/L (ref 20–29)
Calcium: 9.6 mg/dL (ref 8.7–10.2)
Chloride: 103 mmol/L (ref 96–106)
Creatinine, Ser: 0.96 mg/dL (ref 0.76–1.27)
Globulin, Total: 3 g/dL (ref 1.5–4.5)
Glucose: 94 mg/dL (ref 70–99)
Potassium: 4.8 mmol/L (ref 3.5–5.2)
Sodium: 140 mmol/L (ref 134–144)
Total Protein: 7.3 g/dL (ref 6.0–8.5)
eGFR: 93 mL/min/{1.73_m2} (ref >59)

## 2022-10-01 ENCOUNTER — Telehealth: Payer: Self-pay | Admitting: Neurology

## 2022-10-01 ENCOUNTER — Ambulatory Visit: Payer: BC Managed Care – PPO

## 2022-10-01 DIAGNOSIS — R200 Anesthesia of skin: Secondary | ICD-10-CM

## 2022-10-01 DIAGNOSIS — R202 Paresthesia of skin: Secondary | ICD-10-CM | POA: Diagnosis not present

## 2022-10-01 DIAGNOSIS — S060X0A Concussion without loss of consciousness, initial encounter: Secondary | ICD-10-CM

## 2022-10-01 DIAGNOSIS — S134XXA Sprain of ligaments of cervical spine, initial encounter: Secondary | ICD-10-CM

## 2022-10-01 DIAGNOSIS — S069X0A Unspecified intracranial injury without loss of consciousness, initial encounter: Secondary | ICD-10-CM

## 2022-10-01 MED ORDER — GADOBENATE DIMEGLUMINE 529 MG/ML IV SOLN
15.0000 mL | Freq: Once | INTRAVENOUS | Status: AC | PRN
  Administered 2022-10-01: 15 mL via INTRAVENOUS

## 2022-10-01 NOTE — Telephone Encounter (Deleted)
Your Cervical Spine MRI was abnormal.  I have referred you to a NCV and EMG test and a consultation with a neurosurgeon will be scheduled.    Addendum:10-01-2022:  Dear Dr. Darron Doom, Sean Mckay's cervical spine MRI gives reason for concern and I will hold off on PT orders or trigger point injections until he has had NCV and EMG test and seen a Neurosurgeon for operative or conservative treatment options.

## 2022-10-01 NOTE — Progress Notes (Signed)
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  I will offer this patient a referral for a  neurosurgical consultation. We also added a NCV and EMG for the left upper extremity, I will hold off on any trigger point injections.

## 2022-10-01 NOTE — Addendum Note (Signed)
Addended by: Larey Seat on: 10/01/2022 12:51 PM   Modules accepted: Orders

## 2022-10-02 ENCOUNTER — Telehealth: Payer: Self-pay | Admitting: Neurology

## 2022-10-02 NOTE — Telephone Encounter (Signed)
Pt called back to schedule NCV. Pt is requesting a call back to schedule.

## 2022-10-02 NOTE — Telephone Encounter (Signed)
Pt scheduled for NCV/EMG with Dr. Krista Blue for 12/03/22 at 1:30pm and appointment added to wait list

## 2022-10-03 ENCOUNTER — Ambulatory Visit: Payer: BC Managed Care – PPO | Attending: Family Medicine

## 2022-10-03 DIAGNOSIS — F985 Adult onset fluency disorder: Secondary | ICD-10-CM | POA: Diagnosis not present

## 2022-10-03 DIAGNOSIS — R41841 Cognitive communication deficit: Secondary | ICD-10-CM | POA: Insufficient documentation
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
## 2022-10-03 NOTE — Patient Instructions (Addendum)
Practice your speech: Slow down your rate of speech. It doesn't have to be overly slow but just enough to feel more comfortable Easy onset - inhale/speak out, ease into your first sounds, prolong your vowels as need, and be gentle (don't push your words out)

## 2022-10-06 ENCOUNTER — Ambulatory Visit: Payer: BC Managed Care – PPO

## 2022-10-06 ENCOUNTER — Telehealth: Payer: Self-pay | Admitting: Neurology

## 2022-10-06 DIAGNOSIS — F985 Adult onset fluency disorder: Secondary | ICD-10-CM | POA: Diagnosis not present

## 2022-10-06 DIAGNOSIS — R41841 Cognitive communication deficit: Secondary | ICD-10-CM

## 2022-10-06 NOTE — Patient Instructions (Addendum)
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
## 2022-10-06 NOTE — Telephone Encounter (Signed)
LVM and sent mychart msg informing pt of appointment change

## 2022-10-20 ENCOUNTER — Ambulatory Visit: Payer: BC Managed Care – PPO

## 2022-10-22 ENCOUNTER — Encounter: Payer: Self-pay | Admitting: Speech Pathology

## 2022-10-22 ENCOUNTER — Ambulatory Visit: Payer: BC Managed Care – PPO | Attending: Family Medicine | Admitting: Speech Pathology

## 2022-10-22 DIAGNOSIS — R41841 Cognitive communication deficit: Secondary | ICD-10-CM | POA: Diagnosis present

## 2022-10-22 DIAGNOSIS — F985 Adult onset fluency disorder: Secondary | ICD-10-CM | POA: Insufficient documentation
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
## 2022-10-22 NOTE — Patient Instructions (Signed)
   Practice belly breathing by itself without any speech for 5 minutes  Watch you belly go out as you breathe in and go in as you breathe out - hand on belly  Then to the Stretched Syllable Technique - yes, you have to do each syllable for 2 seconds - count on your fingers  Then move on to Diaphragmatic Breathing Technique  Then try to practice the 2 techniques in a conversation with Sean Mckay - keep hand on belly and feel your belly - you will forget to focus on your breath - he can remind you  If you can remove your hand and still focus on your belly going out then in that would be the next step

## 2022-10-29 ENCOUNTER — Ambulatory Visit: Payer: BC Managed Care – PPO | Admitting: Speech Pathology

## 2022-10-31 ENCOUNTER — Ambulatory Visit: Payer: BC Managed Care – PPO

## 2022-10-31 DIAGNOSIS — R41841 Cognitive communication deficit: Secondary | ICD-10-CM | POA: Diagnosis not present

## 2022-10-31 DIAGNOSIS — F985 Adult onset fluency disorder: Secondary | ICD-10-CM

## 2022-10-31 NOTE — Patient Instructions (Addendum)
Your primary focus is breathing (slower rate will happen in conjunction with breath support)  Practice reading aloud at home for 5-10 minutes per day. Breathing is your focus with this!   Cueing Hierarchy: Description - define the word or provide the function Group, Use, Action, Descriptors, Location, Assocation First letter - provide the first letter of the target word Spell - spell the target word, orally Phrase Completion - give a phrase to complete First sound - provide the first sound of the target word Model - say the word aloud to repeat  Example target: "mug" The thing you put coffee in Starts with m M - U - G Fill my coffee _____ "Muh" Mug
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
## 2022-11-03 ENCOUNTER — Ambulatory Visit: Payer: BC Managed Care – PPO

## 2022-11-03 DIAGNOSIS — R41841 Cognitive communication deficit: Secondary | ICD-10-CM

## 2022-11-03 DIAGNOSIS — F985 Adult onset fluency disorder: Secondary | ICD-10-CM

## 2022-11-03 NOTE — Patient Instructions (Addendum)
Pick 3 systems to practice talking about next session (2 common systems & 1 novel/complicated system)   Put up a sticky note in your line of sight to help remind you to "breathe" while you are working/talking on the phone

## 2022-11-04 NOTE — Progress Notes (Signed)
Advanced Hypertension Clinic Initial Assessment:  Date:  11/06/2022   ID:  Sean Mckay, DOB July 10, 1966, MRN [id number]  PCP:  Dois Davenport, MD  Cardiologist:   Chilton Si, MD   No chief complaint on file.  History of Present Illness: Sean Mckay is a 57 y.o. male with CAD s/p RCA PCI, hypertension and hyperlipidemia, who presents to establish care in the Advanced Hypertension Clinic. Sean Mckay was first seen 12/2015 due to poorly-controlled hypertension.  He has struggled with poorly-controlled hypertension and intolerance to many medications.  He had renal artery Dopplers that were negative for renal artery stenosis.  He also had carotid Dopplers due to an episode of syncope that were negative for obstruction.  He also had a nuclear stress test that was negative for ischemia. He reports wearing a 48-hour Holter years ago that showed PACs and PVCs.  Of note, Sean Mckay has a brother who died of sudden cardiac death at age 21. No autopsy was performed.  Both his mother and father have had heart attacks and bypass surgery. Maternal grandfather had a heart attack at age 61.    Sean Mckay was started on HCTZ  12.5 mg daily.  However he did not tolerate this and was started on valsartan, which was subsequently increased to 80 mg due to poor BP control.  He developed fatigue and shortness of breath on nebivolol  daily, so this was reduced.  On 03/2016 he reported episodes of sharp, atypical chest pain.  Given this as well as his family history of CAD and borderline lipids, he was referred for cardiac CT-A.   He was found to have a coronary calcium score of 0.  His pulmonary artery was noted to be mildly dilated to 3.1 x 2.7 cm.   On 04/2018 Sean Mckay reported intermittent episodes of chest pain.   Sean Mckay followed up with his PCP regarding the symptoms.  She checked a chest x-ray that showed no acute findings but did note some old scarring in his left lung.  He  thinks this is due to a prior pneumonia.  She also checked a d-dimer that was normal.  Of note, he had a sleep study in 2017 that was negative for sleep apnea.  Given that there wa no CAD noted on coronary CT-A and he had a calcium score of 0 in 04/2016 it was felt that his symptoms were not related to ischemia.  He also reported palpitations so he was referred for 48-hour Holter monitor that showed PACs and PVCs but no significant arrhythmias.  He was referred for an echo 04/13/18 that showed normal systolic function and trivial TR but was otherwise unremarkable. He saw Corine Shelter 06/2018 and continued to have shortness of breath, so he was referred to pulmonary.   He was seen 11/2020 and reported palpitations. He also reported hypoxia and shortness of breath. He had chest pain but given his calcium score of 0 he was not thought to be ischemic.  He had a nuclear stress test 12/2020 that had significant artifact and was uninterpretable.  He developed a complication of his left hand fingers turning purple during the nuclear stress test.  He subsequently had discomfort.  He had arterial imaging of the right upper extremity that was unremarkable.  He had a coronary CT with moderate RCA disease and minimal LAD disease. FFRCT was negative. He wore a 6-day monitor with rare PVCs. He had a left heart cath 05/2022 and had 90% proximal  to mid RCA stenosis and underwent PCI.  He had an increase in palpitations which were thought to be due to his uncontrolled thyroid disease.  Nebivolol was increased to 10 mg and diltiazem was discontinued.  He had been working with his integrative medicine doctor to better control his thyroid.  Since working on this and increasing nebivolol, his palpitations had been much improved.  He followed up with the pharmacist 06/2022 and blood pressure was uncontrolled. Nifedipine was added. Labs were negative for hyperaldosteronism and pheochromocytoma.  At his 07/2022 visit, he noted increased  neurological symptoms and was concerned it was due to increased dose of valsartan. He was thought to be a good candidate for renal artery denervation. Blood pressures at home were higher than in office. He was asked to track his blood pressures at home twice a day and bring to follow-up in the Advanced Hypertension clinic. At his follow-up with Gillian Shields, NP 08/2022 his BP was not at goal. They planned to trial reduced dose of valsartan 160 mg daily. He was hesitant to add additional medications. He was prescribed doxazosin 2 mg prn for SBP >180. He reported lightheadedness, mild chest pain, gastric discomfort and bloating after taking two doses of doxazosin on a routine basis. He was advised to take doxazosin as needed for SBP >180. In 09/2022 he reported blood pressures averaging 133/92; he had continued to take 2 mg doxazosin daily.  Today, he brought in his home BP cuff and a BP log. On average he sees readings in the 120s systolic, occasionally high but nothing as severe as before. A few times it was low such as 86/68, associated with feeling unwell, but this has been rare. In clinic today his blood pressure is 167/94 which he attributes to feeling agitated. On manual recheck his BP is 136/86. Of note, he had previously reduced valsartan to 160 mg as recommended, but there was no change to his BP or vision issues. At this time he has stopped taking the valsartan. His vision has improved and his BP remained unaffected. He confirms that he is taking the doxazosin daily. He isn't exercising as much due to his recent accident. He continues to work with neurology regarding his neck issues. When he does exercise he usually goes walking on trails, and has noticed some increased exercise tolerance. He is tolerating the Repatha. He denies any palpitations, chest pain, shortness of breath, or peripheral edema. No lightheadedness, headaches, syncope, orthopnea, or PND.  Thyroid labs (noted at 07/2022 visit):  T3  2.6, T4 0.9 TPO Ab 306, thyroglobiunlin 240  Lipid Panel: Total cholesterol 210, HDL 40, LDL 100, triglycerides 340  Adverse effects:  Amlodipine: syncope Lisinopril: dizziness Hydralazine: Muscle aches, fatigue Nifedipine: fatigue, shortness of breath HCTZ: thirst, cough, tremor Nebivolol 20 mg: fatigue, shortness of breath.  Bradycardia with 10mg    Past Medical History:  Diagnosis Date   Adrenal adenoma 05/09/2016   CAD in native artery 04/30/2021   Essential hypertension 05/09/2016   Hyperlipidemia    Hypertriglyceridemia 05/09/2016   Palpitations 12/06/2020   Pure hypercholesterolemia 12/06/2020    Past Surgical History:  Procedure Laterality Date   CORONARY STENT INTERVENTION N/A 05/20/2022   Procedure: CORONARY STENT INTERVENTION;  Surgeon: Lyn Records, MD;  Location[facility] INVASIVE CV LAB;  Service: Cardiovascular;  Laterality: N/A;   HERNIA REPAIR  02/22/09   RIH   LEFT HEART CATH AND CORONARY ANGIOGRAPHY N/A 05/20/2022   Procedure: LEFT HEART CATH AND CORONARY ANGIOGRAPHY;  Surgeon: Verdis Prime  W, MD;  Location: [facility] INVASIVE CV LAB;  Service: Cardiovascular;  Laterality: N/A;    Current Outpatient Medications  Medication Sig Dispense Refill   aspirin EC 81 MG tablet Take 1 tablet (81 mg total) by mouth daily. Swallow whole. 90 tablet 3   Cholecalciferol (VITAMIN D3) 125 MCG (5000 UT) CAPS Take 5,000 Units by mouth daily.     clopidogrel (PLAVIX) 75 MG tablet Take 1 tablet (75 mg total) by mouth daily with breakfast. 90 tablet 3   Coenzyme Q10 100 MG capsule Take 100 mg by mouth daily.     cyanocobalamin (VITAMIN B12) 500 MCG tablet Take 500 mcg by mouth daily.     doxazosin (CARDURA) 2 MG tablet Take 2 mg by mouth daily.     Evolocumab (REPATHA SURECLICK) 140 MG/ML SOAJ Inject 140 mg into the skin every 14 (fourteen) days. 2 mL 12   Krill Oil 500 MG CAPS Take 1-2 capsules by mouth See admin instructions. 2 caps in the morning and 1 caps in the evening     loperamide  (IMODIUM A-D) 2 MG tablet Take 4 mg by mouth daily.     nebivolol (BYSTOLIC) 10 MG tablet Take 1 tablet (10 mg total) by mouth daily. 90 tablet 3   nitroGLYCERIN (NITROSTAT) 0.4 MG SL tablet Place 1 tablet (0.4 mg total) under the tongue every 5 (five) minutes as needed for chest pain. 25 tablet 3   NP THYROID 90 MG tablet Take 90 mg by mouth daily.     vitamin E 180 MG (400 UNITS) capsule Take 400 Units by mouth daily.     No current facility-administered medications for this visit.    Allergies:   Ciprofloxacin, Flomax [tamsulosin], Hydralazine, Hydrochlorothiazide, Lisinopril, Mobic [meloxicam], Nifedipine, Norvasc [amlodipine besylate], Prednisone, Statins, Promethazine, and Vytorin [ezetimibe-simvastatin]    Social History:  The patient  reports that he has never smoked. He has never used smokeless tobacco. He reports that he does not drink alcohol and does not use drugs.   Family History:  The patient's family history includes Aneurysm in his maternal grandmother; Cancer in his father and paternal grandmother; Heart attack in his father, maternal grandfather, and mother; Stroke in his maternal grandmother; Sudden death in his brother.    ROS:   Please see the history of present illness. All other systems are reviewed and negative.    PHYSICAL EXAM: VS:  BP 136/86 (BP Location: Right Arm, Patient Position: Sitting, Cuff Size: Normal)   Pulse 60   Ht 6' (1.829 m)   Wt 186 lb 1.6 oz (84.4 kg)   SpO2 98%   BMI 25.24 kg/m  , BMI Body mass index is 25.24 kg/m. GENERAL:  Well appearing HEENT: Pupils equal round and reactive, fundi not visualized, oral mucosa unremarkable NECK:  No jugular venous distention, waveform within normal limits, carotid upstroke brisk and symmetric, no bruits, no thyromegaly LUNGS:  Clear to auscultation bilaterally HEART:  RRR.  PMI not displaced or sustained,S1 and S2 within normal limits, no S3, no S4, no clicks, no rubs, no murmurs ABD:  Flat, positive  bowel sounds normal in frequency in pitch, no bruits, no rebound, no guarding, no midline pulsatile mass, no hepatomegaly, no splenomegaly EXT:  2 plus pulses throughout, no edema, no cyanosis no clubbing SKIN:  No rashes no nodules NEURO:  Cranial nerves II through XII grossly intact, motor grossly intact throughout PSYCH:  Cognitively intact, oriented to person place and time   EKG:  EKG is personally  reviewed. 11/06/2022:  EKG was not ordered. 08/04/2022:  EKG was not ordered. 04/29/2021: No EKG. 12/06/2020: Sinus bradycardia. Rate 59 bpm. 04/01/2018: Sinus bradycardia.  Rate 53 bpm.  Less than 1 mm ST elevation inferiorly, anteriorly, and laterally. 12/24/15: Sinus bradycardia rate 54 bpm.  R axis deviation.    Carotid Ultrasound  06/13/2022: Summary:  Right Carotid: The extracranial vessels were near-normal with only minimal wall thickening or plaque.   Left Carotid: The extracranial vessels were near-normal with only minimal wall thickening or plaque.   Vertebrals:  Bilateral vertebral arteries demonstrate antegrade flow.  Subclavians: Normal flow hemodynamics were seen in bilateral subclavian arteries.    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 If               clinically indicated, a dedicated thyroid ultrasound is               recommended.   Left Heart Cath  05/20/2022: CONCLUSIONS: Segmental 80% proximal to mid RCA stenosis.  Mid to distal irregularities up to 30%. The proximal stenosis was treated with a 24 x 3.5 mm drug-eluting stent postdilated to 4.0 mm in diameter with TIMI grade III flow noted.  0% stenosis was noted. Left main coronary artery is widely patent. The LAD is transapical and contains no significant obstruction. Circumflex contains mid vessel irregularities up to 30 to 40%. LV function is normal with estimated EF 50 to 55%.  EDP 15 mmHg.    RECOMMENDATIONS: Clopidogrel and aspirin dual antiplatelet therapy for 6 months.  After 6 months okay to drop aspirin.  Continue clopidogrel indefinitely unless bleeding issues. Aggressive risk factor modification Patient having discomfort post procedure without objective evidence of ischemia on EKG, coronary angiography, and therefore will be admitted to the hospital for overnight stay.  Diagnostic Dominance: Right  Intervention    Coronary CTA  05/13/2022: IMPRESSION: 1. Coronary calcium score of 60.8. This was 22 percentile for age-, sex, and race-matched controls.   2. Normal coronary origin with right dominance.   3. Severe (70-99) mixed plaque stenosis in the proximal RCA.   4. Study will be sent for FFR.   RECOMMENDATIONS: CAD-RADS 4: Severe stenosis. (70-99% or > 50% left main). Cardiac catheterization or CT FFR is recommended. Consider symptom-guided anti-ischemic pharmacotherapy as well as risk factor modification per guideline directed care. Invasive coronary angiography recommended with revascularization per published guideline statements.  Stress Myoview  04/22/2022:   The study is normal. The study is low risk.   The patient reported dyspnea, dizziness, flushing and chest pain (atypical) during the stress test. Normal blood pressure and normal heart rate response noted during stress. Heart rate recovery was normal.   Baseline EKG demonstrated diffuse J point elevated consistent with early repolarization abnormality   No change from baseline early repolarization changes during infusion.   Left ventricular function is normal. Nuclear stress EF: 57 %. The left ventricular ejection fraction is normal (55-65%). End diastolic cavity size is normal. End systolic cavity size is normal.   Prior study available for comparison from 12/12/2020.  6-Day ZIO Monitor 02/01/2021 Quality: Fair.  Baseline artifact. Predominant rhythm: Sinus rhythm Average heart rate: 68 bpm Max  heart rate: 143 bpm Min heart rate: 46 bpm Pauses >2.5 seconds: None Rare PVCs, ventricular couplets, and ventricular triplets.  UE Doppler 12/13/2020 Right: No significant arterial obstruction  detected in the right         upper extremity.  Left: No significant arterial obstruction detected in the left upper        extremity.  Lexiscan Myocardial Imaging  12/12/2020 The left ventricular ejection fraction is mildly decreased (45-54%). Nuclear stress EF: 52%. No T wave inversion was noted during stress. Study is very limited due to significant extracardiac tracer uptake. Suspect normal perfusion, however, interpretation difficult due to degree of artifact. Recommend alternative ischemic evaluation if clinically indicated (cardiac MRI vs coronary CTA) given poor quality of current study.  UE Duplex 12/12/2020 Right: Normal flow seen in right mid ulnar and radial arteries.  Left: No obstruction visualized in the left upper extremity Limited        evaluation of left forearm arteries show no evidence of        stenosis.  Echo 04/13/18: LVEF 55-60%.  Trivial TR.   48 Hour Event Monitor 04/13/18: Quality: Fair.  Baseline artifact. Predominant rhythm: sinus rhythm Average heart rate: 67 bpm Max heart rate: 120 bpm Min heart rate: 47 bpm  Rare PVCs and occasional PACs 3 beats of NSVT  Patient reported episodes of chest pain and jaw pain, at which time sinus rhythm was noted.  Carotid artery Doppler 12/05/15: 1-39% ICA stenosis bilaterally  Lexiscan Myoview 12/03/15:  LVEF 61%.  No ischemia.  Echo 12/03/15:  Normal LV size and function.  Mild MR, trace TR.    Recent Labs: 05/22/2022: Hemoglobin 14.3; Magnesium 1.8; Platelets 287 09/24/2022: ALT 15; BUN 8; Creatinine, Ser 0.96; Potassium 4.8; Sodium 140    Lipid Panel    Component Value Date/Time   CHOL 197 03/20/2022 0949   TRIG 227 (H) 03/20/2022 0949   HDL 45 03/20/2022 0949   CHOLHDL 4.4 03/20/2022 0949   LDLCALC 113 (H)  03/20/2022 0949   12/02/15: Total cholesterol 199, triglycerides 180 HDL 36, LDL 126  12/18:  TC 246 197 LDL, HDL 36, tri 194    Wt Readings from Last 3 Encounters:  11/06/22 183 lb (83 kg)  11/06/22 186 lb 1.6 oz (84.4 kg)  11/05/22 183 lb (83 kg)     ASSESSMENT AND PLAN:  Essential hypertension Blood pressure is better controlled and essentially stable at home.  It was higher in the office initially and did improve on repeat.  He is tolerating doxazosin and nebivolol.  Will not change his current dosing.  Encouraged him to increase his exercise back to his baseline as tolerated.  PVC's (premature ventricular contractions) Stable on nebivolol.  CAD in native artery Status post RCA PCI.  He is doing well and has no angina.  Continue aspirin, clopidogrel, nebivolol, and Repatha.  He can stop Plavix 05/2023.  Pure hypercholesterolemia Lipids have historically been uncontrolled.  He has not tolerated statins.  He is doing well on Repatha.  He will come get fasting lipids and a CMP.  LDL goal is at least less than 70.   Plan -Follow-up in regular clinic.  Disposition:    FU with Selwyn Reason C. Duke Salvia, MD, [facility] in 6 months.   Medication Adjustments/Labs and Tests Ordered: Current medicines are reviewed at length with the patient today.  Concerns regarding medicines are outlined above.   Orders Placed This Encounter  Procedures   Comprehensive metabolic panel   No orders of the defined types were placed in this encounter.  Patient Instructions  Medication Instructions:  Your physician recommends that you continue on your current medications as directed. Please refer  to the Current Medication list given to you today.   *If you need a refill on your cardiac medications before your next appointment, please call your pharmacy*  Lab Work: FASTING LP/CMET SOON   Testing/Procedures: NONE  Follow-Up: At [facility], you and your health needs are our priority.  As  part of our continuing mission to provide you with exceptional heart care, we have created designated Provider Care Teams.  These Care Teams include your primary Cardiologist (physician) and Advanced Practice Providers (APPs -  Physician Assistants and Nurse Practitioners) who all work together to provide you with the care you need, when you need it.  We recommend signing up for the patient portal called "MyChart".  Sign up information is provided on this After Visit Summary.  MyChart is used to connect with patients for Virtual Visits (Telemedicine).  Patients are able to view lab/test results, encounter notes, upcoming appointments, etc.  Non-urgent messages can be sent to your provider as well.   To learn more about what you can do with MyChart, go to [email or web address].    Your next appointment:   6 month(s)  Provider:   Chilton Si, MD      [facility],acting as a scribe for Chilton Si, MD.,have documented all relevant documentation on the behalf of Chilton Si, MD,as directed by  Chilton Si, MD while in the presence of Chilton Si, MD.  I, Zeda Gangwer C. Duke Salvia, MD have reviewed all documentation for this visit.  The documentation of the exam, diagnosis, procedures, and orders on 11/06/2022 are all accurate and complete.   Signed, Thelton Graca C. Duke Salvia, MD, [facility]  11/06/2022 12:37 PM    [facility]

## 2022-11-05 ENCOUNTER — Ambulatory Visit: Payer: BC Managed Care – PPO

## 2022-11-05 ENCOUNTER — Ambulatory Visit: Payer: BC Managed Care – PPO | Admitting: Neurology

## 2022-11-05 ENCOUNTER — Encounter: Payer: Self-pay | Admitting: Neurology

## 2022-11-05 VITALS — BP 135/88 | HR 60 | Ht 72.0 in | Wt 183.0 lb

## 2022-11-05 DIAGNOSIS — M5030 Other cervical disc degeneration, unspecified cervical region: Secondary | ICD-10-CM

## 2022-11-05 DIAGNOSIS — S069X0A Unspecified intracranial injury without loss of consciousness, initial encounter: Secondary | ICD-10-CM | POA: Diagnosis not present

## 2022-11-05 DIAGNOSIS — R200 Anesthesia of skin: Secondary | ICD-10-CM | POA: Diagnosis not present

## 2022-11-05 DIAGNOSIS — R4702 Dysphasia: Secondary | ICD-10-CM

## 2022-11-05 DIAGNOSIS — G44329 Chronic post-traumatic headache, not intractable: Secondary | ICD-10-CM

## 2022-11-05 DIAGNOSIS — R41841 Cognitive communication deficit: Secondary | ICD-10-CM | POA: Diagnosis not present

## 2022-11-05 DIAGNOSIS — R202 Paresthesia of skin: Secondary | ICD-10-CM

## 2022-11-05 DIAGNOSIS — F985 Adult onset fluency disorder: Secondary | ICD-10-CM
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
## 2022-11-05 NOTE — Patient Instructions (Signed)
Post-Concussion Syndrome  A concussion is a brain injury from a direct hit to the head or body. This hit causes the brain to shake back and forth fast inside the skull. The shaking can damage brain cells and cause chemical changes in the brain. Concussions are normally not life-threatening but can cause serious symptoms. Post-concussion syndrome is when symptoms that happen after a concussion last longer than normal. These symptoms can last from weeks to months. What are the causes? The cause of this condition is not known. It can happen whether your head injury was mild or severe. What increases the risk? You are more likely to get this condition if: You are male. You are a child, teen, or young adult. You have had a head injury before. You have a history of headaches. You have depression or anxiety. You have more than one symptom or severe symptoms when your concussion occurs. You faint or cannot remember the event (have amnesia of the event). What are the signs or symptoms? Symptoms of this condition include: Physical symptoms, such as: Headaches. Tiredness. Dizziness and weakness. Blurred vision and sensitivity to light. Trouble hearing. Problems with balance. Mental and emotional symptoms, such as: Memory problems and trouble focusing. Trouble falling asleep or staying asleep (insomnia). Feeling irritable. Anxiety or depression. Trouble learning new things. How is this diagnosed? This condition may be diagnosed based on: Your symptoms. A description of your injury. Your medical history. Testing your strength, balance, and nerve function (neurological exam). Your health care provider may order other tests. These may include: Brain imaging, such as a CT scan or an MRI. Memory testing (neuropsychological testing). How is this treated? Treatment for this condition may depend on your symptoms. Symptoms normally go away on their own with time. If you need treatment, it may  include: Medicines for headaches, anxiety, depression, and insomnia. Resting your brain and body for a few days after your injury. Rehab therapy, such as: Physical or occupational therapy. This may include exercises to help with balance and dizziness. Mental health counseling. A form of talk therapy called cognitive behavioral therapy (CBT) can be especially helpful. This therapy helps you set goals and follow up on the changes that you make. Speech therapy. Vision therapy. A brain and eye specialist can recommend treatments for vision problems. Follow these instructions at home: Medicines Take over-the-counter and prescription medicines only as told by your health care provider. Avoid opioid prescription pain medicines when getting over a concussion. Activity Limit your mental activities for the first few days after your injury. This may include not doing these things: Homework or work for your job. Complex thinking. Watching TV. Using a computer or phone. Playing memory games and puzzles. Slowly return to your normal activity level. If a certain activity brings on your symptoms, stop or slow down until you can do the activity without getting symptoms again. Limit physical activity, such as sports or strenuous activities, for the first few days after a concussion. Slowly return to normal activity as told by your health care provider. Light exercise may be helpful. Rest helps your brain heal. Make sure you: Get plenty of sleep at night. Most adults should get at least 7-9 hours of sleep each night. Rest during the day. Take naps or rest breaks when you feel tired. Do not do high-risk activities that could cause a second concussion, such as riding a bike or playing sports. Having another concussion before the first one has healed can be harmful. General instructions  Do   not drink alcohol until your health care provider says that you can. Keep track of how severe your symptoms are and how  often they happen. Give this information to your health care provider. Keep all follow-up visits. Your health care provider may need to check you for new or serious symptoms. Where to find more information Conc[employer]: concussionfoundation.org Contact a health care provider if: Your symptoms do not improve. You get injured again. You have unusual behavior changes. Get help right away if: You have a severe or worsening headache. You have weakness or numbness in any part of your body. You vomit repeatedly. You have mental status changes, such as: Confusion. Trouble speaking. Trouble staying awake. Fainting. You have a seizure. These symptoms may be an emergency. Get help right away. Call 911. Do not wait to see if the symptoms will go away. Do not drive yourself to the hospital. Also, get help right away if: You think about hurting yourself or others. Take one of these steps if you feel like you may hurt yourself or others, or have thoughts about taking your own life: Go to your nearest emergency room. Call 911. Call the National Suicide Prevention Lifeline at 1-800-273-8255 or 988. This is open 24 hours a day. Text the Crisis Text Line at 741741. This information is not intended to replace advice given to you by your health care provider. Make sure you discuss any questions you have with your health care provider. Document Revised: 11/22/2021 Document Reviewed: 11/22/2021 Elsevier Patient Education  2023 [employer]  
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
## 2022-11-06 ENCOUNTER — Telehealth: Payer: Self-pay | Admitting: *Deleted

## 2022-11-06 ENCOUNTER — Ambulatory Visit: Payer: BC Managed Care – PPO | Admitting: Neurology

## 2022-11-06 ENCOUNTER — Ambulatory Visit (HOSPITAL_BASED_OUTPATIENT_CLINIC_OR_DEPARTMENT_OTHER): Payer: BC Managed Care – PPO | Admitting: Cardiovascular Disease

## 2022-11-06 ENCOUNTER — Encounter (HOSPITAL_BASED_OUTPATIENT_CLINIC_OR_DEPARTMENT_OTHER): Payer: Self-pay | Admitting: Cardiovascular Disease

## 2022-11-06 VITALS — BP 136/86 | HR 60 | Ht 72.0 in | Wt 186.1 lb

## 2022-11-06 VITALS — BP 137/92 | HR 62 | Ht 72.0 in | Wt 183.0 lb

## 2022-11-06 DIAGNOSIS — I2510 Atherosclerotic heart disease of native coronary artery without angina pectoris: Secondary | ICD-10-CM

## 2022-11-06 DIAGNOSIS — S069X0A Unspecified intracranial injury without loss of consciousness, initial encounter: Secondary | ICD-10-CM

## 2022-11-06 DIAGNOSIS — S134XXA Sprain of ligaments of cervical spine, initial encounter: Secondary | ICD-10-CM

## 2022-11-06 DIAGNOSIS — I493 Ventricular premature depolarization: Secondary | ICD-10-CM | POA: Diagnosis not present

## 2022-11-06 DIAGNOSIS — R200 Anesthesia of skin: Secondary | ICD-10-CM | POA: Diagnosis not present

## 2022-11-06 DIAGNOSIS — E7800 Pure hypercholesterolemia, unspecified: Secondary | ICD-10-CM | POA: Diagnosis not present

## 2022-11-06 DIAGNOSIS — G5602 Carpal tunnel syndrome, left upper limb: Secondary | ICD-10-CM

## 2022-11-06 DIAGNOSIS — R202 Paresthesia of skin: Secondary | ICD-10-CM

## 2022-11-06 DIAGNOSIS — I10 Essential (primary) hypertension: Secondary | ICD-10-CM | POA: Diagnosis not present

## 2022-11-06 DIAGNOSIS — S060X0A Concussion without loss of consciousness, initial encounter: Secondary | ICD-10-CM

## 2022-11-06 DIAGNOSIS — Z5181 Encounter for therapeutic drug level monitoring: Secondary | ICD-10-CM

## 2022-11-06 NOTE — Assessment & Plan Note (Signed)
Status post RCA PCI.  He is doing well and has no angina.  Continue aspirin, clopidogrel, nebivolol, and Repatha.  He can stop Plavix 05/2023.

## 2022-11-06 NOTE — Patient Instructions (Addendum)
Wear left splint 4-6 weeks and get back to [place]  Carpal Tunnel Syndrome    EXAMPLES! Find what fits and feels best for you    Carpal tunnel syndrome is a condition that causes pain, numbness, and weakness in your hand and fingers. The carpal tunnel is a narrow area located on the palm side of your wrist. Repeated wrist motion or certain diseases may cause swelling within the tunnel. This swelling pinches the main nerve in the wrist. The main nerve in the wrist is called the median nerve. What are the causes? This condition may be caused by: Repeated and forceful wrist and hand motions. Wrist injuries. Arthritis. A cyst or tumor in the carpal tunnel. Fluid buildup during pregnancy. Use of tools that vibrate. Sometimes the cause of this condition is not known. What increases the risk? The following factors may make you more likely to develop this condition: Having a job that requires you to repeatedly or forcefully move your wrist or hand or requires you to use tools that vibrate. This may include jobs that involve using computers, working on an [employer], or working with power tools such as Radiographer, therapeutic. Being a woman. Having certain conditions, such as: Diabetes. Obesity. An underactive thyroid (hypothyroidism). Kidney failure. Rheumatoid arthritis. What are the signs or symptoms? Symptoms of this condition include: A tingling feeling in your fingers, especially in your thumb, index, and middle fingers. Tingling or numbness in your hand. An aching feeling in your entire arm, especially when your wrist and elbow are bent for a long time. Wrist pain that goes up your arm to your shoulder. Pain that goes down into your palm or fingers. A weak feeling in your hands. You may have trouble grabbing and holding items. Your symptoms may feel worse during the night. How is this diagnosed? This condition is diagnosed with a medical history and physical exam. You may also have  tests, including: Electromyogram (EMG). This test measures electrical signals sent by your nerves into the muscles. Nerve conduction study. This test measures how well electrical signals pass through your nerves. Imaging tests, such as X-rays, ultrasound, and MRI. These tests check for possible causes of your condition. How is this treated? This condition may be treated with: Lifestyle changes. It is important to stop or change the activity that caused your condition. Doing exercise and activities to strengthen and stretch your muscles and tendons (physical therapy). Making lifestyle changes to help with your condition and learning how to do your daily activities safely (occupational therapy). Medicines for pain and inflammation. This may include medicine that is injected into your wrist. A wrist splint or brace. Surgery. Follow these instructions at home: If you have a splint or brace: Wear the splint or brace as told by your health care provider. Remove it only as told by your health care provider. Loosen the splint or brace if your fingers tingle, become numb, or turn cold and blue. Keep the splint or brace clean. If the splint or brace is not waterproof: Do not let it get wet. Cover it with a watertight covering when you take a bath or shower. Managing pain, stiffness, and swelling If directed, put ice on the painful area. To do this: If you have a removeable splint or brace, remove it as told by your health care provider. Put ice in a plastic bag. Place a towel between your skin and the bag or between the splint or brace and the bag. Leave the ice on  for 20 minutes, 2-3 times a day. Do not fall asleep with the cold pack on your skin. Remove the ice if your skin turns bright red. This is very important. If you cannot feel pain, heat, or cold, you have a greater risk of damage to the area. Move your fingers often to reduce stiffness and swelling. General instructions Take  over-the-counter and prescription medicines only as told by your health care provider. Rest your wrist and hand from any activity that may be causing your pain. If your condition is work related, talk with your employer about changes that can be made, such as getting a wrist pad to use while typing. Do any exercises as told by your health care provider, physical therapist, or occupational therapist. Keep all follow-up visits. This is important. Contact a health care provider if: You have new symptoms. Your pain is not controlled with medicines. Your symptoms get worse. Get help right away if: You have severe numbness or tingling in your wrist or hand. Summary Carpal tunnel syndrome is a condition that causes pain, numbness, and weakness in your hand and fingers. It is usually caused by repeated wrist motions. Lifestyle changes and medicines are used to treat carpal tunnel syndrome. Surgery may be recommended. Follow your health care provider's instructions about wearing a splint, resting from activity, keeping follow-up visits, and calling for help. This information is not intended to replace advice given to you by your health care provider. Make sure you discuss any questions you have with your health care provider. Document Revised: 11/10/2019 Document Reviewed: 11/10/2019 Elsevier Patient Education  2023 [employer].

## 2022-11-06 NOTE — Telephone Encounter (Signed)
Spouse of pt call back.  Pt there with spouse at work, stating he cannot speak (can only verbalize first syllable of word).  He can text and make his thoughts and wishes known.  He is not having any weakness.  Just finished [place]/EMG, and did make mention that he had this at the end of his visit.  This happened one other time when he  was overtly emotional, lasted about an hour.  (Last Wednesday).  I relayed that will consult Dr. Lucia Gaskins and then call him back [phone].  Spoke to Dr. Lucia Gaskins and was fine when he left, but did have anxiety with exam and had stuttering but then  felt better.  Dr. Lucia Gaskins walked him out.  No AMS.  I called [name] back, relayed what happened with Dr. Lucia Gaskins and when left was ok,  and since he continues cannot speak, but no other deficits.   I relayed to go to ED for evaluation. I said would relay to Dr. Doristine Locks.   He verbalized understanding.

## 2022-11-06 NOTE — Assessment & Plan Note (Signed)
Lipids have historically been uncontrolled.  He has not tolerated statins.  He is doing well on Repatha.  He will come get fasting lipids and a CMP.  LDL goal is at least less than 70.

## 2022-11-06 NOTE — Patient Instructions (Addendum)
Medication Instructions:  Your physician recommends that you continue on your current medications as directed. Please refer to the Current Medication list given to you today.   *If you need a refill on your cardiac medications before your next appointment, please call your pharmacy*  Lab Work: FASTING LP/CMET SOON   Testing/Procedures: NONE  Follow-Up: At [facility], you and your health needs are our priority.  As part of our continuing mission to provide you with exceptional heart care, we have created designated Provider Care Teams.  These Care Teams include your primary Cardiologist (physician) and Advanced Practice Providers (APPs -  Physician Assistants and Nurse Practitioners) who all work together to provide you with the care you need, when you need it.  We recommend signing up for the patient portal called "MyChart".  Sign up information is provided on this After Visit Summary.  MyChart is used to connect with patients for Virtual Visits (Telemedicine).  Patients are able to view lab/test results, encounter notes, upcoming appointments, etc.  Non-urgent messages can be sent to your provider as well.   To learn more about what you can do with MyChart, go to [email or web address].    Your next appointment:   6 month(s)  Provider:   Chilton Si, MD

## 2022-11-06 NOTE — Assessment & Plan Note (Signed)
Stable on nebivolol.  

## 2022-11-06 NOTE — Telephone Encounter (Signed)
NCV and EMG performed yesterday
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
## 2022-11-06 NOTE — Assessment & Plan Note (Signed)
Blood pressure is better controlled and essentially stable at home.  It was higher in the office initially and did improve on repeat.  He is tolerating doxazosin and nebivolol.  Will not change his current dosing.  Encouraged him to increase his exercise back to his baseline as tolerated.
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
## 2022-11-10 ENCOUNTER — Ambulatory Visit: Payer: BC Managed Care – PPO

## 2022-11-10 DIAGNOSIS — R41841 Cognitive communication deficit: Secondary | ICD-10-CM | POA: Diagnosis not present

## 2022-11-10 DIAGNOSIS — F985 Adult onset fluency disorder: Secondary | ICD-10-CM
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
## 2022-11-11 ENCOUNTER — Ambulatory Visit (INDEPENDENT_AMBULATORY_CARE_PROVIDER_SITE_OTHER): Payer: BC Managed Care – PPO

## 2022-11-11 DIAGNOSIS — R200 Anesthesia of skin: Secondary | ICD-10-CM | POA: Diagnosis not present

## 2022-11-11 DIAGNOSIS — R4702 Dysphasia: Secondary | ICD-10-CM

## 2022-11-11 DIAGNOSIS — M5030 Other cervical disc degeneration, unspecified cervical region: Secondary | ICD-10-CM

## 2022-11-11 DIAGNOSIS — R202 Paresthesia of skin: Secondary | ICD-10-CM

## 2022-11-11 DIAGNOSIS — G44329 Chronic post-traumatic headache, not intractable: Secondary | ICD-10-CM | POA: Diagnosis not present

## 2022-11-11 LAB — LIPID PANEL
Chol/HDL Ratio: 3.2 ratio (ref 0.0–5.0)
Cholesterol, Total: 149 mg/dL (ref 100–199)
HDL: 46 mg/dL (ref >39)
LDL Chol Calc (NIH): 73 mg/dL (ref 0–99)
Triglycerides: 180 mg/dL — ABNORMAL HIGH (ref 0–149)
VLDL Cholesterol Cal: 30 mg/dL (ref 5–40)

## 2022-11-11 MED ORDER — GADOBENATE DIMEGLUMINE 529 MG/ML IV SOLN
15.0000 mL | Freq: Once | INTRAVENOUS | Status: AC | PRN
  Administered 2022-11-11: 15 mL via INTRAVENOUS

## 2022-11-11 NOTE — Addendum Note (Signed)
Addended by: Marlene Lard on: 11/11/2022 08:36 AM   Modules accepted: Orders

## 2022-11-12 ENCOUNTER — Ambulatory Visit: Payer: BC Managed Care – PPO | Admitting: Speech Pathology

## 2022-11-13 ENCOUNTER — Encounter (HOSPITAL_BASED_OUTPATIENT_CLINIC_OR_DEPARTMENT_OTHER): Payer: Self-pay | Admitting: Cardiovascular Disease

## 2022-11-13 NOTE — Telephone Encounter (Signed)
Called [facility] to verify that Lipid Panel and CMP were both drawn. It does not appear that CMP was drawn, they will attempt to add on. Test code provided and we will await fax confirmation.

## 2022-11-14 LAB — COMPREHENSIVE METABOLIC PANEL
ALT: 14 IU/L (ref 0–44)
AST: 16 IU/L (ref 0–40)
Albumin/Globulin Ratio: 1.6 (ref 1.2–2.2)
Albumin: 4.1 g/dL (ref 3.8–4.9)
Alkaline Phosphatase: 45 IU/L (ref 44–121)
BUN/Creatinine Ratio: 9 (ref 9–20)
BUN: 8 mg/dL (ref 6–24)
Bilirubin Total: 0.3 mg/dL (ref 0.0–1.2)
CO2: 21 mmol/L (ref 20–29)
Calcium: 9.2 mg/dL (ref 8.7–10.2)
Chloride: 103 mmol/L (ref 96–106)
Creatinine, Ser: 0.93 mg/dL (ref 0.76–1.27)
Globulin, Total: 2.6 g/dL (ref 1.5–4.5)
Glucose: 100 mg/dL — ABNORMAL HIGH (ref 70–99)
Potassium: 4.4 mmol/L (ref 3.5–5.2)
Sodium: 139 mmol/L (ref 134–144)
Total Protein: 6.7 g/dL (ref 6.0–8.5)
eGFR: 96 mL/min/{1.73_m2} (ref >59)

## 2022-11-14 LAB — SPECIMEN STATUS REPORT
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
## 2022-11-17 ENCOUNTER — Ambulatory Visit: Payer: BC Managed Care – PPO | Attending: Family Medicine

## 2022-11-17 DIAGNOSIS — F985 Adult onset fluency disorder: Secondary | ICD-10-CM | POA: Insufficient documentation

## 2022-11-17 DIAGNOSIS — R41841 Cognitive communication deficit: Secondary | ICD-10-CM

## 2022-11-19 ENCOUNTER — Ambulatory Visit: Payer: BC Managed Care – PPO | Admitting: Speech Pathology

## 2022-11-24 ENCOUNTER — Ambulatory Visit: Payer: BC Managed Care – PPO

## 2022-12-03 ENCOUNTER — Encounter: Payer: BC Managed Care – PPO | Admitting: Neurology

## 2022-12-04 ENCOUNTER — Encounter: Payer: BC Managed Care – PPO | Admitting: Neurology

## 2022-12-05 ENCOUNTER — Encounter: Payer: Self-pay | Admitting: Neurology

## 2022-12-24 ENCOUNTER — Telehealth (HOSPITAL_BASED_OUTPATIENT_CLINIC_OR_DEPARTMENT_OTHER): Payer: Self-pay

## 2022-12-24 NOTE — Telephone Encounter (Signed)
Patient cardiac cath 05/20/2022 which revealed segmental 80% proximal to mid RCA stenosis treated with DES. He was placed on DAPT with clopidogrel and aspirin with plan to continue both for 6 months. Per note from Dr. Duke Salvia on 11/06/22, she does not wish for him to stop Plavix until 05/2023.   Please notify requesting provider to determine urgency of procedure prior to November.  Thank you, Marcelino Duster

## 2022-12-24 NOTE — Telephone Encounter (Signed)
   Pre-operative Risk Assessment    Patient Name: Sean Mckay  DOB: 14-May-1966 MRN: [id number]      Request for Surgical Clearance    Procedure:   C6-C7 ESI to the Left  Date of Surgery:  Clearance TBD                                 Surgeon:  Dr. Aileen Fass Surgeon's Group or Practice Name:  [facility] Phone number:  [phone] Fax number:  [phone]   Type of Clearance Requested:   - Pharmacy:  Hold Clopidogrel (Plavix) 7 days prior and restart the day after   Type of Anesthesia:   Note specified   Additional requests/questions:   None  Lorella Nimrod   12/24/2022, 12:16 PM
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
## 2022-12-24 NOTE — Telephone Encounter (Signed)
Call placed to [facility].  Left  a message for them to call back with the urgency of the procedure.

## 2022-12-24 NOTE — Telephone Encounter (Signed)
Sean Mckay from [facility] called back.  She stated that she will have to check with Dr. Yetta Barre to see the urgency of procedure and call us back.

## 2022-12-25 ENCOUNTER — Encounter: Payer: Self-pay | Admitting: Physical Therapy

## 2022-12-25 ENCOUNTER — Ambulatory Visit: Payer: BC Managed Care – PPO | Attending: Neurological Surgery | Admitting: Physical Therapy

## 2022-12-25 DIAGNOSIS — M5412 Radiculopathy, cervical region: Secondary | ICD-10-CM | POA: Insufficient documentation

## 2022-12-25 NOTE — Telephone Encounter (Signed)
Our office called and left vm for [facility] surgery scheduler following up on the notes from yesterday.

## 2022-12-25 NOTE — Patient Instructions (Signed)

## 2022-12-25 NOTE — Telephone Encounter (Signed)
Sean Mckay called back and states Dr. Yetta Barre did say procedure is not urgent and will follow recommendations per cardiologist. I did tell Sean Mckay the pt has appt 05/08/23 with Dr. Duke Salvia. I will fax notes to requesting as well. I thanked Sean Mckay for her help.

## 2022-12-29 ENCOUNTER — Other Ambulatory Visit (HOSPITAL_COMMUNITY): Payer: Self-pay

## 2022-12-29 ENCOUNTER — Telehealth: Payer: Self-pay

## 2022-12-29 NOTE — Telephone Encounter (Signed)
Pharmacy Patient Advocate Encounter  (Pts currently has authorization until 12.12.24)    *RENEWAL Received notification from RN-FOGLEMAN that prior authorization for REPATHA is required/requested.   PA submitted to [facility] via CoverMyMeds Key or (Medicaid) confirmation #[id number]  Status is pending

## 2023-01-01 NOTE — Telephone Encounter (Signed)
Pharmacy Patient Advocate Encounter  Prior Authorization for REPATHA has been APPROVED by [facility] from 6.17.24 to 6.17.25.

## 2023-01-02 ENCOUNTER — Encounter: Payer: Self-pay | Admitting: Physical Therapy

## 2023-01-02 ENCOUNTER — Ambulatory Visit: Payer: BC Managed Care – PPO | Admitting: Physical Therapy

## 2023-01-02 DIAGNOSIS — M5412 Radiculopathy, cervical region: Secondary | ICD-10-CM
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
## 2023-01-03 ENCOUNTER — Other Ambulatory Visit (HOSPITAL_BASED_OUTPATIENT_CLINIC_OR_DEPARTMENT_OTHER): Payer: Self-pay | Admitting: Family

## 2023-01-05 NOTE — Telephone Encounter (Signed)
Rx(s) sent to pharmacy electronically.  
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
## 2023-01-07 ENCOUNTER — Encounter: Payer: Self-pay | Admitting: Physical Therapy

## 2023-01-07 ENCOUNTER — Ambulatory Visit: Payer: BC Managed Care – PPO | Admitting: Physical Therapy

## 2023-01-07 DIAGNOSIS — M5412 Radiculopathy, cervical region: Secondary | ICD-10-CM | POA: Diagnosis not present

## 2023-01-09 ENCOUNTER — Ambulatory Visit: Payer: BC Managed Care – PPO | Admitting: Physical Therapy

## 2023-01-09 ENCOUNTER — Encounter: Payer: Self-pay | Admitting: Physical Therapy

## 2023-01-09 DIAGNOSIS — M5412 Radiculopathy, cervical region: Secondary | ICD-10-CM
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
## 2023-01-12 ENCOUNTER — Ambulatory Visit: Payer: BC Managed Care – PPO | Attending: Neurological Surgery | Admitting: Physical Therapy

## 2023-01-12 ENCOUNTER — Encounter: Payer: Self-pay | Admitting: Physical Therapy

## 2023-01-12 DIAGNOSIS — F985 Adult onset fluency disorder: Secondary | ICD-10-CM

## 2023-01-12 DIAGNOSIS — R41841 Cognitive communication deficit: Secondary | ICD-10-CM | POA: Diagnosis present

## 2023-01-12 DIAGNOSIS — M5412 Radiculopathy, cervical region: Secondary | ICD-10-CM
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
## 2023-01-21 ENCOUNTER — Encounter: Payer: Self-pay | Admitting: Physical Therapy

## 2023-01-21 ENCOUNTER — Ambulatory Visit: Payer: BC Managed Care – PPO | Admitting: Physical Therapy

## 2023-01-21 DIAGNOSIS — M5412 Radiculopathy, cervical region: Secondary | ICD-10-CM | POA: Diagnosis not present

## 2023-01-22 ENCOUNTER — Encounter: Payer: BC Managed Care – PPO | Admitting: Physical Therapy

## 2023-01-26 ENCOUNTER — Encounter: Payer: Self-pay | Admitting: Physical Therapy

## 2023-01-26 ENCOUNTER — Ambulatory Visit: Payer: BC Managed Care – PPO | Admitting: Physical Therapy

## 2023-01-26 DIAGNOSIS — M5412 Radiculopathy, cervical region: Secondary | ICD-10-CM | POA: Diagnosis not present
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
## 2023-01-30 ENCOUNTER — Encounter: Payer: Self-pay | Admitting: Physical Therapy

## 2023-01-30 ENCOUNTER — Ambulatory Visit: Payer: BC Managed Care – PPO | Admitting: Physical Therapy

## 2023-01-30 DIAGNOSIS — M5412 Radiculopathy, cervical region: Secondary | ICD-10-CM
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
## 2023-02-02 ENCOUNTER — Encounter: Payer: Self-pay | Admitting: Physical Therapy

## 2023-02-02 ENCOUNTER — Ambulatory Visit: Payer: BC Managed Care – PPO | Admitting: Physical Therapy

## 2023-02-02 DIAGNOSIS — M5412 Radiculopathy, cervical region: Secondary | ICD-10-CM
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
## 2023-02-05 ENCOUNTER — Ambulatory Visit: Payer: BC Managed Care – PPO

## 2023-02-05 DIAGNOSIS — M5412 Radiculopathy, cervical region: Secondary | ICD-10-CM
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
## 2023-02-09 ENCOUNTER — Ambulatory Visit: Payer: BC Managed Care – PPO

## 2023-02-09 DIAGNOSIS — M5412 Radiculopathy, cervical region: Secondary | ICD-10-CM
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
## 2023-02-12 ENCOUNTER — Ambulatory Visit: Payer: BC Managed Care – PPO | Attending: Neurological Surgery | Admitting: Physical Therapy

## 2023-02-12 ENCOUNTER — Encounter: Payer: Self-pay | Admitting: Physical Therapy

## 2023-02-12 DIAGNOSIS — M542 Cervicalgia: Secondary | ICD-10-CM | POA: Insufficient documentation

## 2023-02-12 DIAGNOSIS — F985 Adult onset fluency disorder: Secondary | ICD-10-CM | POA: Diagnosis present

## 2023-02-12 DIAGNOSIS — M5412 Radiculopathy, cervical region: Secondary | ICD-10-CM | POA: Diagnosis not present

## 2023-02-12 DIAGNOSIS — R41841 Cognitive communication deficit: Secondary | ICD-10-CM | POA: Diagnosis present
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
## 2023-02-16 ENCOUNTER — Encounter: Payer: Self-pay | Admitting: Neurology

## 2023-02-16 ENCOUNTER — Ambulatory Visit: Payer: BC Managed Care – PPO | Admitting: Physical Therapy

## 2023-02-16 ENCOUNTER — Encounter: Payer: Self-pay | Admitting: Physical Therapy

## 2023-02-16 ENCOUNTER — Ambulatory Visit: Payer: BC Managed Care – PPO | Admitting: Neurology

## 2023-02-16 ENCOUNTER — Telehealth: Payer: Self-pay | Admitting: Neurology

## 2023-02-16 VITALS — BP 148/95 | HR 59 | Ht 72.0 in | Wt 177.8 lb

## 2023-02-16 DIAGNOSIS — R202 Paresthesia of skin: Secondary | ICD-10-CM

## 2023-02-16 DIAGNOSIS — M5412 Radiculopathy, cervical region: Secondary | ICD-10-CM

## 2023-02-16 DIAGNOSIS — S134XXA Sprain of ligaments of cervical spine, initial encounter: Secondary | ICD-10-CM

## 2023-02-16 DIAGNOSIS — R4702 Dysphasia: Secondary | ICD-10-CM

## 2023-02-16 DIAGNOSIS — S069X0A Unspecified intracranial injury without loss of consciousness, initial encounter: Secondary | ICD-10-CM | POA: Diagnosis not present

## 2023-02-16 DIAGNOSIS — G44329 Chronic post-traumatic headache, not intractable: Secondary | ICD-10-CM | POA: Diagnosis not present

## 2023-02-16 DIAGNOSIS — R200 Anesthesia of skin: Secondary | ICD-10-CM

## 2023-02-16 DIAGNOSIS — S060X0A Concussion without loss of consciousness, initial encounter: Secondary | ICD-10-CM

## 2023-02-16 MED ORDER — TRAZODONE HCL 50 MG PO TABS: 50.0000 mg | ORAL_TABLET | Freq: Every day | ORAL | 5 refills | Status: DC

## 2023-02-16 NOTE — Telephone Encounter (Signed)
Referral for optometry fax to [facility]. Phone: [phone], Fax: [phone].
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
## 2023-02-18 ENCOUNTER — Encounter (HOSPITAL_BASED_OUTPATIENT_CLINIC_OR_DEPARTMENT_OTHER): Payer: Self-pay
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
## 2023-02-23 ENCOUNTER — Ambulatory Visit: Payer: BC Managed Care – PPO | Admitting: Physical Therapy

## 2023-02-23 ENCOUNTER — Encounter: Payer: Self-pay | Admitting: Physical Therapy

## 2023-02-23 DIAGNOSIS — M5412 Radiculopathy, cervical region: Secondary | ICD-10-CM | POA: Diagnosis not present

## 2023-02-23 DIAGNOSIS — F985 Adult onset fluency disorder: Secondary | ICD-10-CM

## 2023-02-23 DIAGNOSIS — R41841 Cognitive communication deficit: Secondary | ICD-10-CM

## 2023-02-26 ENCOUNTER — Ambulatory Visit: Payer: BC Managed Care – PPO

## 2023-02-26 DIAGNOSIS — M5412 Radiculopathy, cervical region: Secondary | ICD-10-CM | POA: Diagnosis not present
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
## 2023-03-02 ENCOUNTER — Ambulatory Visit: Payer: BC Managed Care – PPO

## 2023-03-02 DIAGNOSIS — M5412 Radiculopathy, cervical region: Secondary | ICD-10-CM | POA: Diagnosis not present
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
## 2023-03-06 ENCOUNTER — Ambulatory Visit: Payer: BC Managed Care – PPO | Admitting: Physical Therapy

## 2023-03-06 ENCOUNTER — Encounter: Payer: Self-pay | Admitting: Physical Therapy

## 2023-03-06 DIAGNOSIS — M542 Cervicalgia: Secondary | ICD-10-CM

## 2023-03-06 DIAGNOSIS — M5412 Radiculopathy, cervical region: Secondary | ICD-10-CM
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
## 2023-03-09 ENCOUNTER — Other Ambulatory Visit: Payer: Self-pay | Admitting: *Deleted

## 2023-03-09 ENCOUNTER — Encounter: Payer: Self-pay | Admitting: Physical Therapy

## 2023-03-09 ENCOUNTER — Ambulatory Visit: Payer: BC Managed Care – PPO | Admitting: Physical Therapy

## 2023-03-09 DIAGNOSIS — M542 Cervicalgia: Secondary | ICD-10-CM

## 2023-03-09 DIAGNOSIS — M5412 Radiculopathy, cervical region: Secondary | ICD-10-CM | POA: Diagnosis not present

## 2023-03-09 MED ORDER — DOXAZOSIN MESYLATE 2 MG PO TABS: ORAL_TABLET | ORAL | 1 refills | Status: DC
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
## 2023-03-13 ENCOUNTER — Ambulatory Visit: Payer: BC Managed Care – PPO

## 2023-03-13 DIAGNOSIS — M542 Cervicalgia: Secondary | ICD-10-CM

## 2023-03-13 DIAGNOSIS — M5412 Radiculopathy, cervical region: Secondary | ICD-10-CM
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
## 2023-03-19 ENCOUNTER — Encounter: Payer: BC Managed Care – PPO | Admitting: Physical Therapy

## 2023-03-23 ENCOUNTER — Ambulatory Visit: Payer: BC Managed Care – PPO | Attending: Neurological Surgery

## 2023-03-23 DIAGNOSIS — M5412 Radiculopathy, cervical region: Secondary | ICD-10-CM | POA: Insufficient documentation

## 2023-03-23 DIAGNOSIS — M542 Cervicalgia: Secondary | ICD-10-CM | POA: Diagnosis present
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
## 2023-03-26 ENCOUNTER — Ambulatory Visit: Payer: BC Managed Care – PPO | Admitting: Physical Therapy

## 2023-03-26 ENCOUNTER — Encounter: Payer: Self-pay | Admitting: Physical Therapy

## 2023-03-26 DIAGNOSIS — M5412 Radiculopathy, cervical region: Secondary | ICD-10-CM | POA: Diagnosis not present

## 2023-03-26 DIAGNOSIS — M542 Cervicalgia: Secondary | ICD-10-CM
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
## 2023-03-30 ENCOUNTER — Ambulatory Visit: Payer: BC Managed Care – PPO

## 2023-03-30 DIAGNOSIS — M5412 Radiculopathy, cervical region: Secondary | ICD-10-CM

## 2023-03-30 DIAGNOSIS — M542 Cervicalgia: Secondary | ICD-10-CM
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
## 2023-04-02 ENCOUNTER — Encounter: Payer: Self-pay | Admitting: Physical Therapy

## 2023-04-02 ENCOUNTER — Ambulatory Visit: Payer: BC Managed Care – PPO | Admitting: Physical Therapy

## 2023-04-02 DIAGNOSIS — M5412 Radiculopathy, cervical region: Secondary | ICD-10-CM

## 2023-04-02 DIAGNOSIS — M542 Cervicalgia: Secondary | ICD-10-CM
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
## 2023-04-06 ENCOUNTER — Encounter: Payer: Self-pay | Admitting: Physical Therapy

## 2023-04-06 ENCOUNTER — Ambulatory Visit: Payer: BC Managed Care – PPO | Admitting: Physical Therapy

## 2023-04-06 DIAGNOSIS — M5412 Radiculopathy, cervical region: Secondary | ICD-10-CM | POA: Diagnosis not present

## 2023-04-06 DIAGNOSIS — M542 Cervicalgia: Secondary | ICD-10-CM
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
## 2023-04-09 ENCOUNTER — Ambulatory Visit: Payer: BC Managed Care – PPO

## 2023-04-09 DIAGNOSIS — M5412 Radiculopathy, cervical region: Secondary | ICD-10-CM | POA: Diagnosis not present

## 2023-04-09 DIAGNOSIS — M542 Cervicalgia: Secondary | ICD-10-CM
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
## 2023-04-13 ENCOUNTER — Encounter: Payer: Self-pay | Admitting: Physical Therapy

## 2023-04-13 ENCOUNTER — Ambulatory Visit: Payer: BC Managed Care – PPO | Admitting: Physical Therapy

## 2023-04-13 DIAGNOSIS — M5412 Radiculopathy, cervical region: Secondary | ICD-10-CM | POA: Diagnosis not present

## 2023-04-13 DIAGNOSIS — M542 Cervicalgia: Secondary | ICD-10-CM
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
## 2023-04-17 ENCOUNTER — Encounter: Payer: Self-pay | Admitting: Physical Therapy

## 2023-04-17 ENCOUNTER — Ambulatory Visit: Payer: BC Managed Care – PPO | Attending: Neurological Surgery | Admitting: Physical Therapy

## 2023-04-17 DIAGNOSIS — M5412 Radiculopathy, cervical region: Secondary | ICD-10-CM | POA: Insufficient documentation

## 2023-04-17 DIAGNOSIS — M542 Cervicalgia: Secondary | ICD-10-CM | POA: Insufficient documentation
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
## 2023-04-20 ENCOUNTER — Ambulatory Visit: Payer: BC Managed Care – PPO

## 2023-04-20 DIAGNOSIS — M5412 Radiculopathy, cervical region: Secondary | ICD-10-CM

## 2023-04-20 DIAGNOSIS — M542 Cervicalgia: Secondary | ICD-10-CM
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
## 2023-04-23 ENCOUNTER — Ambulatory Visit: Payer: BC Managed Care – PPO | Admitting: Physical Therapy

## 2023-04-23 ENCOUNTER — Encounter: Payer: Self-pay | Admitting: Physical Therapy

## 2023-04-23 DIAGNOSIS — M542 Cervicalgia: Secondary | ICD-10-CM

## 2023-04-23 DIAGNOSIS — M5412 Radiculopathy, cervical region: Secondary | ICD-10-CM
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
## 2023-04-27 ENCOUNTER — Other Ambulatory Visit (HOSPITAL_BASED_OUTPATIENT_CLINIC_OR_DEPARTMENT_OTHER): Payer: Self-pay | Admitting: Family

## 2023-04-27 ENCOUNTER — Encounter: Payer: BC Managed Care – PPO | Admitting: Physical Therapy

## 2023-04-27 DIAGNOSIS — I25118 Atherosclerotic heart disease of native coronary artery with other forms of angina pectoris: Secondary | ICD-10-CM

## 2023-04-27 DIAGNOSIS — I10 Essential (primary) hypertension: Secondary | ICD-10-CM

## 2023-04-28 ENCOUNTER — Encounter (HOSPITAL_BASED_OUTPATIENT_CLINIC_OR_DEPARTMENT_OTHER): Payer: Self-pay

## 2023-04-28 DIAGNOSIS — I10 Essential (primary) hypertension: Secondary | ICD-10-CM

## 2023-04-28 DIAGNOSIS — I25118 Atherosclerotic heart disease of native coronary artery with other forms of angina pectoris: Secondary | ICD-10-CM

## 2023-04-28 MED ORDER — NEBIVOLOL HCL 10 MG PO TABS: 10.0000 mg | ORAL_TABLET | Freq: Every day | ORAL | 3 refills | Status: DC

## 2023-05-01 ENCOUNTER — Encounter: Payer: BC Managed Care – PPO | Admitting: Physical Therapy

## 2023-05-08 ENCOUNTER — Ambulatory Visit (HOSPITAL_BASED_OUTPATIENT_CLINIC_OR_DEPARTMENT_OTHER): Payer: BC Managed Care – PPO | Admitting: Cardiovascular Disease

## 2023-05-08 ENCOUNTER — Encounter (HOSPITAL_BASED_OUTPATIENT_CLINIC_OR_DEPARTMENT_OTHER): Payer: Self-pay | Admitting: Cardiovascular Disease

## 2023-05-08 VITALS — BP 126/78 | HR 66 | Ht 72.0 in | Wt 181.0 lb

## 2023-05-08 DIAGNOSIS — I10 Essential (primary) hypertension: Secondary | ICD-10-CM

## 2023-05-08 DIAGNOSIS — I2510 Atherosclerotic heart disease of native coronary artery without angina pectoris: Secondary | ICD-10-CM | POA: Diagnosis not present

## 2023-05-08 DIAGNOSIS — R0609 Other forms of dyspnea: Secondary | ICD-10-CM

## 2023-05-08 DIAGNOSIS — D3500 Benign neoplasm of unspecified adrenal gland: Secondary | ICD-10-CM | POA: Diagnosis not present

## 2023-05-08 DIAGNOSIS — I493 Ventricular premature depolarization: Secondary | ICD-10-CM

## 2023-05-08 DIAGNOSIS — I25118 Atherosclerotic heart disease of native coronary artery with other forms of angina pectoris: Secondary | ICD-10-CM

## 2023-05-08 DIAGNOSIS — R1319 Other dysphagia: Secondary | ICD-10-CM

## 2023-05-08 MED ORDER — TADALAFIL 10 MG PO TABS: 10.0000 mg | ORAL_TABLET | Freq: Every day | ORAL | 11 refills | Status: DC | PRN

## 2023-05-08 MED ORDER — CLOPIDOGREL BISULFATE 75 MG PO TABS: 75.0000 mg | ORAL_TABLET | Freq: Every day | ORAL | 3 refills | Status: DC

## 2023-05-08 MED ORDER — DOXAZOSIN MESYLATE 2 MG PO TABS: ORAL_TABLET | ORAL | 3 refills | Status: DC
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
## 2023-05-08 NOTE — Patient Instructions (Addendum)
Medication Instructions:  Your physician has recommended you make the following change in your medication:   Stop Aspirin   We have refilled Plavix & Doxazosin to [facility].   We have refilled C[facility]is to [facility].    Testing/Procedures: Your physician has requested that you have an echocardiogram. Echocardiography is a painless test that uses sound waves to create images of your heart. It provides your doctor with information about the size and shape of your heart and how well your heart's chambers and valves are working. This procedure takes approximately one hour. There are no restrictions for this procedure. Please do NOT wear cologne, perfume, aftershave, or lotions (deodorant is allowed). Please arrive 15 minutes prior to your appointment time.   Follow-Up: At [facility], you and your health needs are our priority.  As part of our continuing mission to provide you with exceptional heart care, we have created designated Provider Care Teams.  These Care Teams include your primary Cardiologist (physician) and Advanced Practice Providers (APPs -  Physician Assistants and Nurse Practitioners) who all work together to provide you with the care you need, when you need it.  We recommend signing up for the patient portal called "MyChart".  Sign up information is provided on this After Visit Summary.  MyChart is used to connect with patients for Virtual Visits (Telemedicine).  Patients are able to view lab/test results, encounter notes, upcoming appointments, etc.  Non-urgent messages can be sent to your provider as well.   To learn more about what you can do with MyChart, go to [email or web address].    Your next appointment:   6 months with Dr. Duke Salvia or Gillian Shields, NP   Other Instructions We have referred you to GI with Dr. Rhea Belton. If you have not heard from them in 2 weeks their contact number is [phone]

## 2023-05-12 ENCOUNTER — Encounter (HOSPITAL_BASED_OUTPATIENT_CLINIC_OR_DEPARTMENT_OTHER): Payer: Self-pay | Admitting: Cardiovascular Disease

## 2023-05-18 ENCOUNTER — Encounter (HOSPITAL_BASED_OUTPATIENT_CLINIC_OR_DEPARTMENT_OTHER): Payer: Self-pay | Admitting: Cardiovascular Disease

## 2023-05-25 ENCOUNTER — Ambulatory Visit (HOSPITAL_BASED_OUTPATIENT_CLINIC_OR_DEPARTMENT_OTHER): Payer: BC Managed Care – PPO

## 2023-05-25 DIAGNOSIS — R0609 Other forms of dyspnea: Secondary | ICD-10-CM

## 2023-05-26 LAB — ECHOCARDIOGRAM COMPLETE
Area-P 1/2: 2.66 cm2
S' Lateral: 2.17 cm

## 2023-05-31 ENCOUNTER — Other Ambulatory Visit: Payer: Self-pay | Admitting: Cardiovascular Disease

## 2023-06-01 NOTE — Telephone Encounter (Signed)
Repatha refill  

## 2023-06-03 ENCOUNTER — Encounter (HOSPITAL_BASED_OUTPATIENT_CLINIC_OR_DEPARTMENT_OTHER): Payer: Self-pay | Admitting: Cardiovascular Disease

## 2023-06-13 ENCOUNTER — Encounter (HOSPITAL_BASED_OUTPATIENT_CLINIC_OR_DEPARTMENT_OTHER): Payer: Self-pay

## 2023-06-13 DIAGNOSIS — I10 Essential (primary) hypertension: Secondary | ICD-10-CM

## 2023-06-15 MED ORDER — DOXAZOSIN MESYLATE 2 MG PO TABS: 2.0000 mg | ORAL_TABLET | Freq: Every day | ORAL | 3 refills | Status: DC

## 2023-06-24 ENCOUNTER — Encounter (HOSPITAL_BASED_OUTPATIENT_CLINIC_OR_DEPARTMENT_OTHER): Payer: Self-pay

## 2024-03-01 ENCOUNTER — Other Ambulatory Visit: Payer: Self-pay | Admitting: Cardiovascular Disease

## 2024-03-01 DIAGNOSIS — I2510 Atherosclerotic heart disease of native coronary artery without angina pectoris: Secondary | ICD-10-CM

## 2024-03-01 DIAGNOSIS — E7800 Pure hypercholesterolemia, unspecified: Secondary | ICD-10-CM

## 2024-03-01 MED ORDER — REPATHA SURECLICK 140 MG/ML ~~LOC~~ SOAJ: 140.0000 mg | SUBCUTANEOUS | 0 refills | Status: DC

## 2024-03-02 ENCOUNTER — Telehealth: Payer: Self-pay | Admitting: Pharmacy Technician

## 2024-03-02 ENCOUNTER — Other Ambulatory Visit (HOSPITAL_COMMUNITY): Payer: Self-pay

## 2024-03-02 ENCOUNTER — Encounter (HOSPITAL_BASED_OUTPATIENT_CLINIC_OR_DEPARTMENT_OTHER): Payer: Self-pay | Admitting: Cardiovascular Disease

## 2024-03-02 DIAGNOSIS — E7800 Pure hypercholesterolemia, unspecified: Secondary | ICD-10-CM

## 2024-03-02 DIAGNOSIS — I2510 Atherosclerotic heart disease of native coronary artery without angina pectoris: Secondary | ICD-10-CM

## 2024-03-02 NOTE — Telephone Encounter (Signed)
 Pharmacy Patient Advocate Encounter  Received notification from [facility] that Prior Authorization for repatha  has been APPROVED from 03/02/24 to 03/02/25. Ran test claim, Copay is $90.00 3 months. This test claim was processed through [facility]- copay amounts may vary at other pharmacies due to pharmacy/plan contracts, or as the patient moves through the different stages of their insurance plan.   PA #/Case ID/Reference #: [id number]

## 2024-03-02 NOTE — Telephone Encounter (Signed)
 Pharmacy Patient Advocate Encounter   Received notification from CoverMyMeds that prior authorization for Repatha  is required/requested.   Insurance verification completed.   The patient is insured through [facility] .   Per test claim: PA required; PA submitted to above mentioned insurance via Latent Key/confirmation #/EOC [id number] Status is pending

## 2024-03-03 MED ORDER — REPATHA SURECLICK 140 MG/ML ~~LOC~~ SOAJ: 140.0000 mg | SUBCUTANEOUS | 3 refills | Status: AC

## 2024-03-09 ENCOUNTER — Encounter (HOSPITAL_BASED_OUTPATIENT_CLINIC_OR_DEPARTMENT_OTHER): Payer: Self-pay | Admitting: Cardiovascular Disease

## 2024-04-10 ENCOUNTER — Other Ambulatory Visit (HOSPITAL_BASED_OUTPATIENT_CLINIC_OR_DEPARTMENT_OTHER): Payer: Self-pay | Admitting: Cardiovascular Disease

## 2024-04-10 DIAGNOSIS — I2510 Atherosclerotic heart disease of native coronary artery without angina pectoris: Secondary | ICD-10-CM

## 2024-04-10 DIAGNOSIS — I25118 Atherosclerotic heart disease of native coronary artery with other forms of angina pectoris: Secondary | ICD-10-CM

## 2024-04-11 ENCOUNTER — Other Ambulatory Visit (HOSPITAL_BASED_OUTPATIENT_CLINIC_OR_DEPARTMENT_OTHER): Payer: Self-pay

## 2024-04-11 ENCOUNTER — Other Ambulatory Visit: Payer: Self-pay

## 2024-04-11 ENCOUNTER — Encounter (HOSPITAL_BASED_OUTPATIENT_CLINIC_OR_DEPARTMENT_OTHER): Payer: Self-pay

## 2024-04-11 DIAGNOSIS — I25118 Atherosclerotic heart disease of native coronary artery with other forms of angina pectoris: Secondary | ICD-10-CM

## 2024-04-11 DIAGNOSIS — I2510 Atherosclerotic heart disease of native coronary artery without angina pectoris: Secondary | ICD-10-CM

## 2024-04-11 MED ORDER — CLOPIDOGREL BISULFATE 75 MG PO TABS: 75.0000 mg | ORAL_TABLET | Freq: Every day | ORAL | 3 refills | Status: AC

## 2024-04-11 MED ORDER — CLOPIDOGREL BISULFATE 75 MG PO TABS
75.0000 mg | ORAL_TABLET | Freq: Every day | ORAL | 3 refills | Status: DC
  Filled 2024-04-11: qty 90, 90d supply, fill #0

## 2024-04-11 NOTE — Addendum Note (Signed)
 Addended by: GLADIS PORTER HERO on: 04/11/2024 04:53 PM   Modules accepted: Orders

## 2024-04-12 ENCOUNTER — Other Ambulatory Visit: Payer: Self-pay

## 2024-04-29 ENCOUNTER — Other Ambulatory Visit (HOSPITAL_BASED_OUTPATIENT_CLINIC_OR_DEPARTMENT_OTHER): Payer: Self-pay | Admitting: Cardiovascular Disease

## 2024-04-29 DIAGNOSIS — I10 Essential (primary) hypertension: Secondary | ICD-10-CM

## 2024-04-29 DIAGNOSIS — I25118 Atherosclerotic heart disease of native coronary artery with other forms of angina pectoris: Secondary | ICD-10-CM

## 2024-05-02 ENCOUNTER — Encounter (HOSPITAL_BASED_OUTPATIENT_CLINIC_OR_DEPARTMENT_OTHER): Payer: Self-pay

## 2024-05-02 DIAGNOSIS — I25118 Atherosclerotic heart disease of native coronary artery with other forms of angina pectoris: Secondary | ICD-10-CM

## 2024-05-02 DIAGNOSIS — I10 Essential (primary) hypertension: Secondary | ICD-10-CM

## 2024-05-02 MED ORDER — NEBIVOLOL HCL 10 MG PO TABS: 10.0000 mg | ORAL_TABLET | Freq: Every day | ORAL | 3 refills | Status: AC

## 2024-05-06 ENCOUNTER — Encounter (HOSPITAL_BASED_OUTPATIENT_CLINIC_OR_DEPARTMENT_OTHER): Payer: Self-pay
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
## 2024-05-09 ENCOUNTER — Encounter (HOSPITAL_BASED_OUTPATIENT_CLINIC_OR_DEPARTMENT_OTHER): Payer: Self-pay | Admitting: Emergency Medicine

## 2024-05-09 ENCOUNTER — Ambulatory Visit: Attending: Emergency Medicine

## 2024-05-09 ENCOUNTER — Ambulatory Visit (HOSPITAL_BASED_OUTPATIENT_CLINIC_OR_DEPARTMENT_OTHER): Payer: Self-pay | Admitting: Family

## 2024-05-09 VITALS — BP 118/76 | HR 61 | Ht 72.0 in | Wt 183.0 lb

## 2024-05-09 DIAGNOSIS — R002 Palpitations: Secondary | ICD-10-CM

## 2024-05-09 DIAGNOSIS — I25118 Atherosclerotic heart disease of native coronary artery with other forms of angina pectoris: Secondary | ICD-10-CM

## 2024-05-09 DIAGNOSIS — R072 Precordial pain: Secondary | ICD-10-CM | POA: Diagnosis not present

## 2024-05-09 DIAGNOSIS — E785 Hyperlipidemia, unspecified: Secondary | ICD-10-CM

## 2024-05-09 DIAGNOSIS — I10 Essential (primary) hypertension: Secondary | ICD-10-CM | POA: Diagnosis not present

## 2024-05-09 DIAGNOSIS — N529 Male erectile dysfunction, unspecified: Secondary | ICD-10-CM

## 2024-05-09 MED ORDER — TADALAFIL 10 MG PO TABS: 10.0000 mg | ORAL_TABLET | Freq: Every day | ORAL | 0 refills | Status: AC | PRN

## 2024-05-09 MED ORDER — DOXAZOSIN MESYLATE 2 MG PO TABS: 2.0000 mg | ORAL_TABLET | ORAL | 3 refills | Status: AC | PRN

## 2024-05-09 MED ORDER — TADALAFIL 10 MG PO TABS: 10.0000 mg | ORAL_TABLET | Freq: Every day | ORAL | 0 refills | Status: DC | PRN

## 2024-05-09 NOTE — Assessment & Plan Note (Addendum)
 LHC 05/20/2022: Segmental 80% proximal-mid RCA stenosis, treated with DES with TIMI-3 flow and 0% stenosis following procedure.  Nonobstructive disease in LAD and LCx Echo 05/25/2023: LVEF 60-65%, no RWMA, trivial MV regurg, normal AV EKG: NSR, 61 bpm, early repolarization, no significant change from prior studies. Given early repolarization, reviewed EKG with Dr. Mona, who agrees with interpretation.  Reports rare episodes of chest pain lasting 10-15 minutes, tightness, radiating to left shoulder/shoulder blade/tingling down left arm, associated with SOB and occasional dizziness, occurring at rest and with activity, not exacerbated with activity, unchanged over the last year. Denies missing any doses of his antiplatelet therapy  Has used nitroglycerin  1-2 times in the past, side effects were more uncomfortable than his symptoms of chest pain, has not used any since. Continue Plavix  75 mg daily Continue nitroglycerin  0.4 mg SL as needed chest pain every 5 minutes X3 Will order NM PET stress test to rule out ischemia/infarction given complaints of chest pain

## 2024-05-09 NOTE — Progress Notes (Unsigned)
Enrolled for [employer] to mail a ZIO XT long term holter monitor to the patients address on file.   Dr. Oval Linsey to read.

## 2024-05-09 NOTE — Assessment & Plan Note (Signed)
 EKG: NSR, 61 bpm, early repolarization, no significant change from prior studies Reports frequent sensations of skipped heartbeat associated with dizziness and occasionally sensations of near syncope, occurring multiple times on a weekly basis. Will order 2-week ZIO monitor to evaluate for ectopy/arrhythmia Will order CBC to rule out anemia and for Plavix  monitoring Will order BMP, TSH to rule out electrolyte abnormalities or thyroid  dysfunction

## 2024-05-09 NOTE — Patient Instructions (Signed)
 Medication Instructions:   DO NOT TAKE CIALIS  AND NITROGLYCERIN  WITHIN 48 HRS OF EACH OTHER  *If you need a refill on your cardiac medications before your next appointment, please call your pharmacy*  Lab Work:  SOMETIME THIS WEEK HERE AT [facility] OR ANY [facility]--3RD FLOOR [address]--BMET, CBC, TSH, AND LIPIDS--PLEASE COME FASTING TO THIS LAB APPOINTMENT  If you have labs (blood work) drawn today and your tests are completely normal, you will receive your results only by: MyChart Message (if you have MyChart) OR A paper copy in the mail If you have any lab test that is abnormal or we need to change your treatment, we will call you to review the results.   Testing/Procedures:  GEOFFRY HEWS- Long Term Monitor Instructions  Your physician has requested you wear a ZIO patch monitor for 14 days.  This is a single patch monitor. [employer] supplies one patch monitor per enrollment. Additional stickers are not available. Please do not apply patch if you will be having a Nuclear Stress Test,  Echocardiogram, Cardiac CT, MRI, or Chest Xray during the period you would be wearing the  monitor. The patch cannot be worn during these tests. You cannot remove and re-apply the  ZIO XT patch monitor.  Your ZIO patch monitor will be mailed 3 day USPS to your address on file. It may take 3-5 days  to receive your monitor after you have been enrolled.  Once you have received your monitor, please review the enclosed instructions. Your monitor  has already been registered assigning a specific monitor serial # to you.  Billing and Patient Assistance Program Information  We have supplied [employer] with any of your insurance information on file for billing purposes. [employer] offers a sliding scale Patient Assistance Program for patients that do not have  insurance, or whose insurance does not completely cover the cost of the ZIO monitor.  You must apply for the Patient Assistance Program to qualify for this discounted  rate.  To apply, please call [employer] at [phone], select option 4, select option 2, ask to apply for  Patient Assistance Program. Meredeth will ask your household income, and how many people  are in your household. They will quote your out-of-pocket cost based on that information.  [employer] will also be able to set up a 7-month, interest-free payment plan if needed.  Applying the monitor   Shave hair from upper left chest.  Hold abrader disc by orange tab. Rub abrader in 40 strokes over the upper left chest as  indicated in your monitor instructions.  Clean area with 4 enclosed alcohol pads. Let dry.  Apply patch as indicated in monitor instructions. Patch will be placed under collarbone on left  side of chest with arrow pointing upward.  Rub patch adhesive wings for 2 minutes. Remove white label marked 1. Remove the white  label marked 2. Rub patch adhesive wings for 2 additional minutes.  While looking in a mirror, press and release button in center of patch. A small green light will  flash 3-4 times. This will be your only indicator that the monitor has been turned on.  Do not shower for the first 24 hours. You may shower after the first 24 hours.  Press the button if you feel a symptom. You will hear a small click. Record Date, Time and  Symptom in the Patient Logbook.  When you are ready to remove the patch, follow instructions on the last 2 pages of Patient  Logbook. Stick patch  monitor onto the last page of Patient Logbook.  Place Patient Logbook in the blue and white box. Use locking tab on box and tape box closed  securely. The blue and white box has prepaid postage on it. Please place it in the mailbox as  soon as possible. Your physician should have your test results approximately 7 days after the  monitor has been mailed back to [facility].  Call [facility] Customer Care at [phone] if you have questions regarding  your ZIO XT patch monitor. Call them  immediately if you see an orange light blinking on your  monitor.  If your monitor falls off in less than 4 days, contact our Monitor department at [phone].  If your monitor becomes loose or falls off after 4 days call [employer] at [phone] for  suggestions on securing your monitor      Please report to Radiology at the [facility] Main Entrance 30 minutes early for your test.  [address] [place], [place] [address]                       How to Prepare for Your Cardiac PET/CT Stress Test:  Nothing to eat or drink, except water, 3 hours prior to arrival time.  NO caffeine/decaffeinated products, or chocolate 12 hours prior to arrival. (Please note decaffeinated beverages (teas/coffees) still contain caffeine).  If you have caffeine within 12 hours prior, the test will need to be rescheduled.  Medication instructions: Do not take erectile dysfunction medications for 72 hours prior to test (sildenafil , tadalafil )--DO NOT TAKE CIALIS  72 HOURS PRIOR TO THIS TEST Do not take nitrates (isosorbide  mononitrate, Ranexa) the day before or day of test  DO NOT TAKE NITROGLYCERIN  THE DAY BEFORE OR DAY OF THIS TEST    Diabetic Preparation: If able to eat breakfast prior to 3 hour fasting, you may take all medications, including your insulin. Do not worry if you miss your breakfast dose of insulin - start at your next meal. If you do not eat prior to 3 hour fast-Hold all diabetes (oral and insulin) medications. Patients who wear a continuous glucose monitor MUST remove the device prior to scanning.  You may take your remaining medications with water.  NO perfume, cologne or lotion on chest or abdomen area.   Total time is 1 to 2 hours; you may want to bring reading material for the waiting time.  In preparation for your appointment, medication and supplies will be purchased.  Appointment availability is limited, so if you need to cancel or reschedule, please call the  Radiology Department Scheduler at [phone] 24 hours in advance to avoid a cancellation fee of $100.00  What to Expect When you Arrive:  Once you arrive and check in for your appointment, you will be taken to a preparation room within the Radiology Department.  A technologist or Nurse will obtain your medical history, verify that you are correctly prepped for the exam, and explain the procedure.  Afterwards, an IV will be started in your arm and electrodes will be placed on your skin for EKG monitoring during the stress portion of the exam. Then you will be escorted to the PET/CT scanner.  There, staff will get you positioned on the scanner and obtain a blood pressure and EKG.  During the exam, you will continue to be connected to the EKG and blood pressure machines.  A small, safe amount of a radioactive tracer will be injected in your  IV to obtain a series of pictures of your heart along with an injection of a stress agent.    After your Exam:  It is recommended that you eat a meal and drink a caffeinated beverage to counter act any effects of the stress agent.  Drink plenty of fluids for the remainder of the day and urinate frequently for the first couple of hours after the exam.  Your doctor will inform you of your test results within 7-10 business days.  For more information and frequently asked questions, please visit our website: [email or web address]  For questions about your test or how to prepare for your test, please call: Cardiac Imaging Nurse Navigators Office: [phone]     Follow-Up:  2 MONTHS WITH DR. [place], CAITLIN WALKER, NP OR ROSALINE BANE, NP

## 2024-05-09 NOTE — Assessment & Plan Note (Addendum)
 Reports BPs well-controlled at home  Continue Cardura  2 mg PRN elevated blood pressure Continue Nebivolol  10 mg daily

## 2024-05-10 ENCOUNTER — Telehealth (HOSPITAL_COMMUNITY): Payer: Self-pay | Admitting: Emergency Medicine

## 2024-05-10 LAB — CBC
Hematocrit: 46.5 % (ref 37.5–51.0)
Hemoglobin: 15.1 g/dL (ref 13.0–17.7)
MCH: 27.9 pg (ref 26.6–33.0)
MCHC: 32.5 g/dL (ref 31.5–35.7)
MCV: 86 fL (ref 79–97)
Platelets: 310 x10E3/uL (ref 150–450)
RBC: 5.41 x10E6/uL (ref 4.14–5.80)
RDW: 13.3 % (ref 11.6–15.4)
WBC: 5.7 x10E3/uL (ref 3.4–10.8)

## 2024-05-10 LAB — LIPID PANEL
Chol/HDL Ratio: 4 ratio (ref 0.0–5.0)
Cholesterol, Total: 175 mg/dL (ref 100–199)
HDL: 44 mg/dL (ref >39)
LDL Chol Calc (NIH): 79 mg/dL (ref 0–99)
Triglycerides: 321 mg/dL — ABNORMAL HIGH (ref 0–149)
VLDL Cholesterol Cal: 52 mg/dL — ABNORMAL HIGH (ref 5–40)

## 2024-05-10 NOTE — Telephone Encounter (Signed)
Attempted to call patient regarding upcoming cardiac PET appointment. Left message on voicemail with name and callback number Aidan Moten RN Navigator Cardiac Imaging [facility] [phone] Office [phone] Cell  

## 2024-05-10 NOTE — Telephone Encounter (Signed)
 Reaching out to patient to offer assistance regarding upcoming cardiac imaging study; pt verbalizes understanding of appt date/time, parking situation and where to check in, pre-test NPO status and medications ordered, and verified current allergies; name and call back number provided for further questions should they arise Camie Shutter RN Navigator Cardiac Imaging [facility] [phone] office [phone] cell   States took cialis  Sunday evening which was > 48 hrs ago

## 2024-05-11 ENCOUNTER — Encounter (HOSPITAL_COMMUNITY)
Admission: RE | Admit: 2024-05-11 | Discharge: 2024-05-11 | Disposition: A | Discharge status: Home / Self Care | Source: Ambulatory Visit | Attending: Emergency Medicine | Admitting: Emergency Medicine

## 2024-05-11 DIAGNOSIS — I25118 Atherosclerotic heart disease of native coronary artery with other forms of angina pectoris: Secondary | ICD-10-CM | POA: Diagnosis not present

## 2024-05-11 DIAGNOSIS — R072 Precordial pain: Secondary | ICD-10-CM | POA: Diagnosis present

## 2024-05-11 LAB — BASIC METABOLIC PANEL WITH GFR
BUN/Creatinine Ratio: 11 (ref 9–20)
BUN: 10 mg/dL (ref 6–24)
CO2: 22 mmol/L (ref 20–29)
Calcium: 8.9 mg/dL (ref 8.7–10.2)
Chloride: 103 mmol/L (ref 96–106)
Creatinine, Ser: 0.9 mg/dL (ref 0.76–1.27)
Glucose: 90 mg/dL (ref 70–99)
Potassium: 4.5 mmol/L (ref 3.5–5.2)
Sodium: 140 mmol/L (ref 134–144)
eGFR: 99 mL/min/1.73 (ref >59)

## 2024-05-11 LAB — TSH: TSH: 3.4 u[IU]/mL (ref 0.450–4.500)

## 2024-05-11 MED ORDER — REGADENOSON 0.4 MG/5ML IV SOLN
0.4000 mg | Freq: Once | INTRAVENOUS | Status: AC
  Administered 2024-05-11: 0.4 mg via INTRAVENOUS

## 2024-05-11 MED ORDER — RUBIDIUM RB82 GENERATOR (RUBYFILL)
20.6000 | PACK | Freq: Once | INTRAVENOUS | Status: AC
  Administered 2024-05-11: 20.64 via INTRAVENOUS

## 2024-05-11 MED ORDER — RUBIDIUM RB82 GENERATOR (RUBYFILL)
20.6200 | PACK | Freq: Once | INTRAVENOUS | Status: AC
  Administered 2024-05-11: 20.62 via INTRAVENOUS

## 2024-05-12 ENCOUNTER — Telehealth: Payer: Self-pay | Admitting: Pharmacy Technician

## 2024-05-12 ENCOUNTER — Ambulatory Visit: Payer: Self-pay | Admitting: Emergency Medicine

## 2024-05-12 ENCOUNTER — Other Ambulatory Visit (HOSPITAL_COMMUNITY): Payer: Self-pay

## 2024-05-12 LAB — NM PET CT CARDIAC PERFUSION MULTI W/ABSOLUTE BLOODFLOW
LV dias vol: 57 mL (ref 62–150)
LV sys vol: 62 mL (ref 4.2–5.8)
MBFR: 2.72
Nuc Rest EF: 57 %
Nuc Stress EF: 62 %
Peak HR: 69 {beats}/min
Rest HR: 54 {beats}/min
Rest MBF: 0.57 ml/g/min
Rest Nuclear Isotope Dose: 20.6 mCi
ST Depression (mm): 0 mm
Stress MBF: 1.55 ml/g/min
Stress Nuclear Isotope Dose: 20.6 mCi
TID: 1.08

## 2024-05-12 MED ORDER — ICOSAPENT ETHYL 1 G PO CAPS: 2.0000 g | ORAL_CAPSULE | Freq: Two times a day (BID) | ORAL | 3 refills | Status: AC

## 2024-05-12 MED ORDER — ICOSAPENT ETHYL 1 G PO CAPS: 2.0000 g | ORAL_CAPSULE | Freq: Two times a day (BID) | ORAL | 11 refills | Status: DC

## 2024-05-12 NOTE — Telephone Encounter (Signed)
 Insurance denied stating denied due to patient not on statins but he is statin intolerant. Sent more info to insurance.

## 2024-05-12 NOTE — Telephone Encounter (Signed)
   Per test claim insurance requires brand and brand needs pa still  Pharmacy Patient Advocate Encounter   Received notification from Pt Calls Messages that prior authorization for vascepa is required/requested.   Insurance verification completed.   The patient is insured through [facility].   Per test claim: PA required; PA submitted to above mentioned insurance via Latent Key/confirmation #/EOC [id number] Status is pending

## 2024-05-12 NOTE — Telephone Encounter (Signed)
 Insurance will pay for brand -can the vascepa be sent in as daw y1 -brand per provider please? Copay would be 5.00 for 30 days. Thank you!   Pharmacy Patient Advocate Encounter  Received notification from [facility] that Prior Authorization for vascepa has been APPROVED from 05/12/24 to 05/13/27. Ran test claim, Copay is $5.00. This test claim was processed through [facility]- copay amounts may vary at other pharmacies due to pharmacy/plan contracts, or as the patient moves through the different stages of their insurance plan.   PA #/Case ID/Reference #: [id number]

## 2024-06-27 ENCOUNTER — Encounter (HOSPITAL_BASED_OUTPATIENT_CLINIC_OR_DEPARTMENT_OTHER): Payer: Self-pay

## 2024-06-28 ENCOUNTER — Encounter (HOSPITAL_BASED_OUTPATIENT_CLINIC_OR_DEPARTMENT_OTHER): Payer: Self-pay | Admitting: Family

## 2024-06-28 ENCOUNTER — Ambulatory Visit (HOSPITAL_BASED_OUTPATIENT_CLINIC_OR_DEPARTMENT_OTHER): Admitting: Family

## 2024-06-28 VITALS — BP 108/78 | HR 65 | Ht 72.0 in | Wt 183.0 lb

## 2024-06-28 DIAGNOSIS — E785 Hyperlipidemia, unspecified: Secondary | ICD-10-CM

## 2024-06-28 DIAGNOSIS — E781 Pure hyperglyceridemia: Secondary | ICD-10-CM

## 2024-06-28 DIAGNOSIS — I10 Essential (primary) hypertension: Secondary | ICD-10-CM

## 2024-06-28 DIAGNOSIS — I25118 Atherosclerotic heart disease of native coronary artery with other forms of angina pectoris: Secondary | ICD-10-CM

## 2024-06-28 NOTE — Progress Notes (Signed)
 Cardiology Office Note   Date:  06/28/2024  ID:  TAIMUR FIER, DOB 07-Mar-1966, MRN [id number] PCP: Burney Darice CROME, MD  [facility] Providers Cardiologist:  Annabella Scarce, MD Cardiology APP:  Vannie Reche RAMAN, NP     History of Present Illness Sean Mckay is a 58 y.o. male with a hx of HTN, HLD, palpitations, CAD, aortic atherosclerosis.   He has established with Dr. Scarce due to poorly controlled hypotension and multiple medication intolerances. Previous renal artery dopplers with no evidence of renal artery stenosis. Previous carotid dopplers due to syncope with no obstruction. Nuclear stress test negative for ischemia. Previous holter monitor years ago with PAC's and PVCs. Cardiac CTA 2017 with coronary calcium  score of 0 and pulmonary artery mildly dilated at 3.1x2.7 cm. Sleep study 2017 no evidence of OSA. 48-hour holter with PAC and PVC with no significant arrhythmia. Echo 04/13/18 normal LVEF, trivial TR.    Previous intolerance to HCTZ, increased doses of Nebivolol , Zetia , Amlodipine  (syncope), Lisinopril (dizziness), Nifedipine  (fatigue, SOB).   May 2022 presented with lightheadedness, palpitations, presyncope, shortness of breath. Lexiscan  myoview  was poor quality study. Subsequent cardiac CTA with stable small hiatal hernia, coronary calcium  score of 24.9 plaicng him in 65th percentile, moderate (50-69%) mixed plaque stenosis in prox RCA - overall CAD-RADS 3 moderate stenosis. He contacted the office noting palpitations and wore ZIO 01/2021 Predominantly NSR. PAC and PVC with <1% burden, no significant arrhythmia. He had 55 triggered events during wear time (6 days) which were predominantly associated with NSR or PVCs.    April 2023 he had bilateral inguinal hernia surgery.Seen 01/20/22 noting ncreased myalgias with increased Rosuvastatin . Has since been started on Repatha . Seen 04/15/22 noting chest discomfort with myoview  04/15/22 no ischemia. Given persistent  chest discomfort was recommended for LHC, but preferred to start with cardiac CTA. Amlodipine  trialed but did not tolerate due to dizziness. Cardiac CTA 05/13/22 with coronary calcium  score 60.8 placing him in 71st percentile with severe mixed plaque in RCA positive by FFR.Subsequent LHC 05/20/22 with DES-RCA. Had post procedure chest pain and was admitted for observation. He was started on Imdur  15mg  daily and Cialis /Viagra  held. Losartan, Bystolic , Repatha , Rosuvastatin  were continued. He did not tolerate imdur  due to headache, hypotension.    Last seen 05/09/2024.Due to precordial pain cardiac PET ordered and performed 05/11/2024 low risk study, no ischemia, normal LVEF 57%, normal myocardial blood flow.  To be ZIO monitor due to palpitations with Penumalli normal sinus rhythm average heart rate 66 bpm, 1 run of SVT lasting 8 beats, no significant arrhythmias. SVT was not triggered. Triggered episodes associated with NSR in the 60s.   Presents today for follow up independently. Reports reaction last night to Brimionide eye drop. Encouraged to reach out to prescribing provider which he has already done. Reviewed reassuring cardiac PET and ZIO monitor results. Palpitations have been overall quiescent. Reports blood pressure has been well controlled at home overall. Reports after starting Vascepa  felt shortness of breath and chest pressure. He stopped it after one week and symptoms resolved. Discussed atypical reaction to Vascepa . Reports triglycerides last week at PCP 279.    Previous antihypertensive Imdur  - headache HCTZ  increased doses of Nebivolol  Amlodipine  (syncope) Lisinopril (dizziness) Nifedipine  (fatigue, SOB).  ROS: Please see the history of present illness.    All other systems reviewed and are negative.   Studies Reviewed      Cardiac Studies & Procedures   ______________________________________________________________________________________________ CARDIAC  CATHETERIZATION  CARDIAC CATHETERIZATION 05/20/2022  Conclusion CONCLUSIONS: Segmental 80% proximal to mid RCA stenosis.  Mid to distal irregularities up to 30%. The proximal stenosis was treated with a 24 x 3.5 mm drug-eluting stent postdilated to 4.0 mm in diameter with TIMI grade III flow noted.  0% stenosis was noted. Left main coronary artery is widely patent. The LAD is transapical and contains no significant obstruction. Circumflex contains mid vessel irregularities up to 30 to 40%. LV function is normal with estimated EF 50 to 55%.  EDP 15 mmHg.  RECOMMENDATIONS:  Clopidogrel  and aspirin  dual antiplatelet therapy for 6 months.  After 6 months okay to drop aspirin .  Continue clopidogrel  indefinitely unless bleeding issues. Aggressive risk factor modification Patient having discomfort post procedure without objective evidence of ischemia on EKG, coronary angiography, and therefore will be admitted to the hospital for overnight stay.  Findings Coronary Findings Diagnostic  Dominance: Right  Left Circumflex Mid Cx lesion is 30% stenosed.  Right Coronary Artery Prox RCA lesion is 80% stenosed. Mid RCA lesion is 30% stenosed.  Intervention  Prox RCA lesion Stent A stent was successfully placed. Post-Intervention Lesion Assessment The intervention was successful. Pre-interventional TIMI flow is 3. Post-intervention TIMI flow is 3. No complications occurred at this lesion. There is a 0% residual stenosis post intervention.   STRESS TESTS  NM PET CT CARDIAC PERFUSION MULTI W/ABSOLUTE BLOODFLOW 05/11/2024  Narrative   The study is normal. The study is low risk.   LV perfusion is normal. There is no evidence of ischemia. There is no evidence of infarction.   Rest left ventricular function is normal. Rest EF: 57%. Stress left ventricular function is normal. Stress EF: 62%. End diastolic cavity size is normal. End systolic cavity size is normal. No evidence of transient  ischemic dilation (TID) noted.   Myocardial blood flow was computed to be 0.57ml/g/min at rest and 1.55ml/g/min at stress. Global myocardial blood flow reserve was 2.72 and was normal.   Coronary calcium  assessment not performed due to prior revascularization.   Electronically signed by: Soyla DELENA Merck, MD  EXAM: OVER-READ INTERPRETATION  CT CHEST  The following report is a limited chest CT over-read performed by radiologist Dr. Elsie Ko [facility] on 05/12/2024. This over-read does not include interpretation of cardiac or coronary anatomy or pathology nor does it include evaluation of the PET data. The cardiac PET-CT interpretation by the cardiologist is attached.  COMPARISON:  Chest radiographs 09/13/2022.  Cardiac CT 05/13/2022.  FINDINGS: Mediastinum/Nodes: No enlarged lymph nodes within the visualized mediastinum.  Lungs/Pleura: There is no pleural effusion. The visualized lungs appear clear.  Upper abdomen: No significant findings in the visualized upper abdomen.  Musculoskeletal/Chest wall: No chest wall mass or suspicious osseous findings within the visualized chest on axial imaging.  IMPRESSION: No significant extracardiac findings within the visualized chest.   Electronically Signed By: Elsie Perone M.D. On: 05/12/2024 08:40   ECHOCARDIOGRAM  ECHOCARDIOGRAM COMPLETE 05/25/2023  Narrative ECHOCARDIOGRAM REPORT    Patient Name:   Sean Mckay Date of Exam: 05/25/2023 Medical Rec #:  [id number]          Height:       72.0 in Accession #:    [id number]         Weight:       181.0 lb Date of Birth:  [birth year]           BSA:          2.042 m Patient Age:    30 years  BP:           110/80 mmHg Patient Gender: M                  HR:           66 bpm. Exam Location:  Outpatient  Procedure: 3D Echo, 2D Echo, Color Doppler, Cardiac Doppler and Strain Analysis  Indications:    Dyspnea  History:        Patient has prior  history of Echocardiogram examinations, most recent 04/13/2018. Arrythmias:PVC, Signs/Symptoms:Shortness of Breath and Chest Pain; Risk Factors:Hypertension, Dyslipidemia and Non-Smoker.  Sonographer:    Orvil Holmes RDCS Referring Phys: [id number] TIFFANY [place]  IMPRESSIONS   1. Left ventricular ejection fraction, by estimation, is 60 to 65%. The left ventricle has normal function. The left ventricle has no regional wall motion abnormalities. Left ventricular diastolic parameters were normal. The average left ventricular global longitudinal strain is -17.8 %. The global longitudinal strain is normal. 2. Right ventricular systolic function is normal. The right ventricular size is normal. There is normal pulmonary artery systolic pressure. 3. The mitral valve is normal in structure. Trivial mitral valve regurgitation. No evidence of mitral stenosis. 4. The aortic valve is tricuspid. Aortic valve regurgitation is not visualized. No aortic stenosis is present. 5. The inferior vena cava is normal in size with greater than 50% respiratory variability, suggesting right atrial pressure of 3 mmHg.  Comparison(s): Prior images unable to be directly viewed, comparison made by report only. No significant change from prior study.  Conclusion(s)/Recommendation(s): Normal biventricular function without evidence of hemodynamically significant valvular heart disease.  FINDINGS Left Ventricle: Left ventricular ejection fraction, by estimation, is 60 to 65%. The left ventricle has normal function. The left ventricle has no regional wall motion abnormalities. The average left ventricular global longitudinal strain is -17.8 %. The global longitudinal strain is normal. The left ventricular internal cavity size was normal in size. There is no left ventricular hypertrophy. Left ventricular diastolic parameters were normal.  Right Ventricle: The right ventricular size is normal. No increase in right ventricular  wall thickness. Right ventricular systolic function is normal. There is normal pulmonary artery systolic pressure. The tricuspid regurgitant velocity is 1.65 m/s, and with an assumed right atrial pressure of 3 mmHg, the estimated right ventricular systolic pressure is 13.9 mmHg.  Left Atrium: Left atrial size was normal in size.  Right Atrium: Right atrial size was normal in size.  Pericardium: There is no evidence of pericardial effusion.  Mitral Valve: The mitral valve is normal in structure. Trivial mitral valve regurgitation. No evidence of mitral valve stenosis.  Tricuspid Valve: The tricuspid valve is normal in structure. Tricuspid valve regurgitation is trivial. No evidence of tricuspid stenosis.  Aortic Valve: The aortic valve is tricuspid. Aortic valve regurgitation is not visualized. No aortic stenosis is present.  Pulmonic Valve: The pulmonic valve was grossly normal. Pulmonic valve regurgitation is trivial. No evidence of pulmonic stenosis.  Aorta: The aortic root, ascending aorta and aortic arch are all structurally normal, with no evidence of dilitation or obstruction.  Venous: The inferior vena cava is normal in size with greater than 50% respiratory variability, suggesting right atrial pressure of 3 mmHg.  IAS/Shunts: No atrial level shunt detected by color flow Doppler.   LEFT VENTRICLE PLAX 2D LVIDd:         3.92 cm   Diastology LVIDs:         2.17 cm   LV e' medial:    6.74  cm/s LV PW:         1.27 cm   LV E/e' medial:  8.1 LV IVS:        1.24 cm   LV e' lateral:   9.36 cm/s LVOT diam:     2.00 cm   LV E/e' lateral: 5.8 LV SV:         58 LV SV Index:   29        2D Longitudinal Strain LVOT Area:     3.14 cm  2D Strain GLS (A2C):   -19.0 % 2D Strain GLS (A3C):   -19.0 % 2D Strain GLS (A4C):   -15.5 % 2D Strain GLS Avg:     -17.8 %  3D Volume EF: 3D EF:        61 % LV EDV:       114 ml LV ESV:       45 ml LV SV:        69 ml  RIGHT VENTRICLE RV Basal  diam:  2.96 cm RV S prime:     13.80 cm/s TAPSE (M-mode): 2.4 cm  LEFT ATRIUM             Index        RIGHT ATRIUM           Index LA diam:        2.90 cm 1.42 cm/m   RA Area:     13.00 cm LA Vol (A2C):   47.8 ml 23.41 ml/m  RA Volume:   32.70 ml  16.01 ml/m LA Vol (A4C):   60.6 ml 29.68 ml/m LA Biplane Vol: 56.2 ml 27.52 ml/m AORTIC VALVE LVOT Vmax:   90.70 cm/s LVOT Vmean:  57.600 cm/s LVOT VTI:    0.186 m  AORTA Ao Root diam: 3.50 cm Ao Asc diam:  3.40 cm  MITRAL VALVE               TRICUSPID VALVE MV Area (PHT): 2.66 cm    TR Peak grad:   10.9 mmHg MV Decel Time: 285 msec    TR Vmax:        165.00 cm/s MV E velocity: 54.50 cm/s MV A velocity: 66.00 cm/s  SHUNTS MV E/A ratio:  0.83        Systemic VTI:  0.19 m Systemic Diam: 2.00 cm  Shelda Bruckner MD Electronically signed by Shelda Bruckner MD Signature Date/Time: 05/26/2023/11:35:19 AM    Final    MONITORS  LONG TERM MONITOR (3-14 DAYS) 02/01/2021  Narrative 6-day ZIO monitor  Quality: Fair.  Baseline artifact. Predominant rhythm: Sinus rhythm Average heart rate: 68 bpm Max heart rate: 143 bpm Min heart rate: 46 bpm Pauses >2.5 seconds: None Rare PVCs, ventricular couplets, and ventricular triplets.  Tiffany C. Raford, MD, [facility] 02/26/2021 8:21 AM   Patch Wear Time:  5 days and 21 hours (2022-07-13T22:41:17-0400 to 2022-07-19T20:31:58-0400)  Isolated SVEs were rare (<1.0%), SVE Couplets were rare (<1.0%), and no SVE Triplets were present. Isolated VEs were rare (<1.0%, 2123), VE Couplets were rare (<1.0%, 204), and VE Triplets were rare (<1.0%, 10). Ventricular Bigeminy and Trigeminy were present.   CT SCANS  CT CORONARY MORPH W/CTA COR W/SCORE 05/13/2022  Addendum 05/13/2022 12:07 PM ADDENDUM REPORT: 05/13/2022 12:04  EXAM: OVER-READ INTERPRETATION  CT CHEST  The following report is an over-read performed by radiologist Dr. Elsie Falco [facility] on  05/13/2022. This over-read does not include interpretation of cardiac or coronary anatomy or  pathology. The coronary calcium  score/coronary CTA interpretation by the cardiologist is attached.  COMPARISON:  January 08, 2021.  FINDINGS: Vascular: There are no significant non-cardiac vascular findings.  Mediastinum/Nodes: There are no enlarged lymph nodes.The visualized esophagus demonstrates no significant findings.  Lungs/Pleura: No focal consolidation, pleural effusion, or pneumothorax.  Upper abdomen: No acute abnormality.  Unchanged small hiatal hernia.  Musculoskeletal/Chest wall: No chest wall abnormality. No acute or significant osseous findings.  IMPRESSION: 1. Unchanged small hiatal hernia.   Electronically Signed By: Elsie ONEIDA Shoulder M.D. On: 05/13/2022 12:04  Narrative CLINICAL DATA:  58 yo male with chest pain  EXAM: Cardiac/Coronary CTA  TECHNIQUE: A non-contrast, gated CT scan was obtained with axial slices of 3 mm through the heart for calcium  scoring. Calcium  scoring was performed using the Agatston method. A 120 kV prospective, gated, contrast cardiac scan was obtained. Gantry rotation speed was 250 msecs and collimation was 0.6 mm. Two sublingual nitroglycerin  tablets (0.8 mg) were given. The 3D data set was reconstructed in 5% intervals of the 35-75% of the R-R cycle. Diastolic phases were analyzed on a dedicated workstation using MPR, MIP, and VRT modes. The patient received 95 cc of contrast.  FINDINGS: Image quality: Excellent.  Noise artifact is: Limited (mis-registration).  Coronary Arteries:  Normal coronary origin.  Right dominance.  Left main: The left main is a large caliber vessel with a normal take off from the left coronary cusp that trifurcates into a LAD, LCX, and ramus intermedius. There is no plaque or stenosis.  Left anterior descending artery: The LAD is patent without evidence of plaque or stenosis.  Ramus intermedius: Patent  with no evidence of plaque or stenosis.  Left circumflex artery: The LCX is non-dominant and patent with no evidence of plaque or stenosis. The LCX gives off 2 patent obtuse marginal branches.  Right coronary artery: The RCA is dominant with normal take off from the right coronary cusp. There is severe (70-99) mixed plaque stenosis in the proximal vessel. The RCA terminates as a PDA and right posterolateral branch without evidence of plaque or stenosis.  Right Atrium: Right atrial size is within normal limits.  Right Ventricle: The right ventricular cavity is within normal limits.  Left Atrium: Left atrial size is normal in size with no left atrial appendage filling defect.  Left Ventricle: The ventricular cavity size is within normal limits. There are no stigmata of prior infarction. There is no abnormal filling defect.  Pulmonary arteries: Normal in size without proximal filling defect.  Pulmonary veins: Normal pulmonary venous drainage.  Pericardium: Normal thickness with no significant effusion or calcium  present.  Cardiac valves: The aortic valve is trileaflet without significant calcification. The mitral valve is normal structure without significant calcification.  Aorta: Normal caliber with no significant disease.  Extra-cardiac findings: See attached radiology report for non-cardiac structures.  IMPRESSION: 1. Coronary calcium  score of 60.8. This was 74 percentile for age-, sex, and race-matched controls.  2. Normal coronary origin with right dominance.  3. Severe (70-99) mixed plaque stenosis in the proximal RCA.  4. Study will be sent for FFR.  RECOMMENDATIONS: CAD-RADS 4: Severe stenosis. (70-99% or > 50% left main). Cardiac catheterization or CT FFR is recommended. Consider symptom-guided anti-ischemic pharmacotherapy as well as risk factor modification per guideline directed care. Invasive coronary angiography recommended with revascularization per  published guideline statements.  Redell Shallow, MD  Electronically Signed: By: Redell Shallow M.D. On: 05/13/2022 11:07   CT SCANS  CT CORONARY FRACTIONAL FLOW RESERVE  DATA PREP 01/08/2021  Narrative EXAM: FFRCT ANALYSIS  FINDINGS: FFRct analysis was performed on the original cardiac CT angiogram dataset. Diagrammatic representation of the FFRct analysis is provided in a separate PDF document in PACS. This dictation was created using the PDF document and an interactive 3D model of the results. 3D model is not available in the EMR/PACS. Normal FFR range is >0.80.  1. Left Main: findings 0.99, 0.99 0.99  2. LAD: findings 0.97, 0.91 0.86  3. LCX: findings 0.99, 0.85 0.86; OM2: 0.84, 0.80, 0.77  4. Ramus: findings 0.98, 0.97 0.95  5. RCA: findings 0.98, 0.87 0.86  IMPRESSION: FFR abnormal in very distal OM2 but otherwise normal; medical therapy likely best option.  Note: These examples are not recommendations of [employer] and only provided as examples of what other customers are doing.   Electronically Signed By: Redell Shallow M.D. On: 01/08/2021 13:32   CT CORONARY MORPH W/CTA COR W/SCORE 01/08/2021  Addendum 01/08/2021 11:18 AM ADDENDUM REPORT: 01/08/2021 11:15  CLINICAL DATA:  58 yo male with chest pain  EXAM: Cardiac/Coronary CTA  TECHNIQUE: A non-contrast, gated CT scan was obtained with axial slices of 3 mm through the heart for calcium  scoring. Calcium  scoring was performed using the Agatston method. A 120 kV prospective, gated, contrast cardiac scan was obtained. Gantry rotation speed was 250 msecs and collimation was 0.6 mm. Two sublingual nitroglycerin  tablets (0.8 mg) were given. The 3D data set was reconstructed in 5% intervals of the 35-75% of the R-R cycle. Diastolic phases were analyzed on a dedicated workstation using MPR, MIP, and VRT modes. The patient received 95 cc of contrast.  FINDINGS: Image quality: Good  Coronary Arteries:   Normal coronary origin.  Right dominance.  Left main: The left main is a large caliber vessel with a normal take off from the left coronary cusp that trifurcates into a LAD, LCX, and ramus intermedius. There is no plaque or stenosis.  Left anterior descending artery: The LAD has minimal (0-24%) soft plaque in the proximal vessel.  Ramus intermedius: Branching vessel; patent with no evidence of plaque or stenosis.  Left circumflex artery: The LCX is non-dominant and patent with no evidence of plaque or stenosis. The LCX gives off 2 large patent obtuse marginal branches.  Right coronary artery: The RCA is dominant with normal take off from the right coronary cusp. There is moderate (50-69%) mixed plaque stenosis in the proximal vessel. The RCA terminates as a small PDA and right posterolateral branch.  Right Atrium: Right atrial size is within normal limits.  Right Ventricle: The right ventricular cavity is within normal limits.  Left Atrium: Left atrial size is normal in size with no left atrial appendage filling defect.  Left Ventricle: The ventricular cavity size is within normal limits. There are no stigmata of prior infarction. There is no abnormal filling defect.  Pulmonary veins: Normal pulmonary venous drainage.  Pericardium: Normal thickness with no significant effusion or calcium  present.  Cardiac valves: The aortic valve is trileaflet without significant calcification. The mitral valve is normal structure without significant calcification.  Aorta: Normal caliber with no significant disease.  Extra-cardiac findings: See attached radiology report for non-cardiac structures.  IMPRESSION: 1. Coronary calcium  score of 24.9. This was 59 percentile for age-, sex, and race-matched controls.  2. Normal coronary origin with right dominance.  3. Moderate (50-69%) mixed plaque stenosis in the proximal RCA.  4. Study will be sent for  FFR.  RECOMMENDATIONS: CAD-RADS 3: Moderate stenosis. Consider symptom-guided anti-ischemic  pharmacotherapy as well as risk factor modification per guideline directed care. Additional analysis with CT FFR will be submitted.  Redell Shallow, MD   Electronically Signed By: Redell Shallow M.D. On: 01/08/2021 11:15  Narrative EXAM: OVER-READ INTERPRETATION  CT CHEST  The following report is an over-read performed by radiologist Dr. Marcey Moan of [facility] on 01/08/2021. This over-read does not include interpretation of cardiac or coronary anatomy or pathology. The coronary CTA interpretation by the cardiologist is attached.  COMPARISON:  Prior coronary study on 04/15/2016. CT of the abdomen on 04/27/2020.  FINDINGS: Vascular: No significant noncardiac vascular findings.  Mediastinum/Nodes: Visualized mediastinum and hilar regions demonstrate no lymphadenopathy or masses. Stable small hiatal hernia.  Lungs/Pleura: Visualized lungs show no evidence of pulmonary edema, consolidation, pneumothorax, nodule or pleural fluid.  Upper Abdomen: No significant findings. Imaging did not extend low enough to visualize the adrenal glands. By prior CT of the abdomen, there did not appear to be a Sean right adrenal nodule.  Musculoskeletal: No chest wall mass or suspicious bone lesions identified.  IMPRESSION: Stable small hiatal hernia.  Electronically Signed: By: Marcey Moan M.D. On: 01/08/2021 10:42     ______________________________________________________________________________________________        Risk Assessment/Calculations           Physical Exam VS:  BP 108/78 (BP Location: Left Arm, Patient Position: Sitting, Cuff Size: Normal)   Pulse 65   Ht 6' (1.829 m)   Wt 183 lb (83 kg)   SpO2 95%   BMI 24.82 kg/m        Wt Readings from Last 3 Encounters:  06/28/24 183 lb (83 kg)  05/09/24 183 lb (83 kg)  05/08/23 181 lb (82.1 kg)     GEN: Well nourished, well developed in no acute distress NECK: No JVD; No carotid bruits CARDIAC: RRR, no murmurs, rubs, gallops RESPIRATORY:  Clear to auscultation without rales, wheezing or rhonchi  ABDOMEN: Soft, non-tender, non-distended EXTREMITIES:  No edema; No deformity   ASSESSMENT AND PLAN  CAD - Low risk cardiac PET 04/2024. No recurrent angina. No further testing indicated.  Recommend aiming for 150 minutes of moderate intensity activity per week and following a heart healthy diet.   GDMT plavix  75mg  daily, Nebivolol  10mg  daily.  Lipid management as below.   HLD, LDL goal <70 / Hypertriglycercidemia - Did not tolerate statin. Continue Repatah 140mg  q14 days. Reports dyspnea after 1 week Vascepa , discussed atypical side effect.  He is willing to retrial.  Will slowly start Vascepa .  Recommend Vascepa  1 tab daily x 1 week ? 1 tab BID x 1 week ? 1 tab AM and 2 tabs PM x 1 week ? 2 tabs BID. If he has trouble with Vascepa , he will let us  know via MyChart. Lipid panel after 2-3 mos of max tolerated dose.   HTN - BP well controlled. Continue current antihypertensive regimen Doxazosin  2mg  at bedtime, Bystolic  10mg  daily. Discussed to monitor BP at home at least 2 hours after medications and sitting for 5-10 minutes.        Dispo: follow up in 6 months  Signed, Reche GORMAN Finder, NP

## 2024-06-28 NOTE — Patient Instructions (Addendum)
 Medication Instructions:   START Vascepa  Week 1: One tablet daily Week 2: One tablet twice daily Week 3: One tablet in the morning, two tablets in the evening Week 4: Two tablets twice daily  *If you need a refill on your cardiac medications before your next appointment, please call your pharmacy*  Lab Work: Once you find your maximum tolerated dose of Vascepa , we will repeat a fasting lipid panel after 2-3 months.   Please have this collected at [facility] at [place]. The lab is open 8:00 am - 4:30 pm. Please avoid 12:00p - 1:00p for lunch hour. You do not need an appointment. Please go to [address] [place], [place] [address]. This is in the Primary Care office on the 3rd floor, let them know you are there for blood work and they will direct you to the lab.   If you have labs (blood work) drawn today and your tests are completely normal, you will receive your results only by: MyChart Message (if you have MyChart) OR A paper copy in the mail If you have any lab test that is abnormal or we need to change your treatment, we will call you to review the results.  Follow-Up: At [facility], you and your health needs are our priority.  As part of our continuing mission to provide you with exceptional heart care, our providers are all part of one team.  This team includes your primary Cardiologist (physician) and Advanced Practice Providers or APPs (Physician Assistants and Nurse Practitioners) who all work together to provide you with the care you need, when you need it.  Your next appointment:   6 month(s)  Provider:   Annabella Scarce, MD, Rosaline Bane, NP, or Reche Finder, NP    We recommend signing up for the patient portal called MyChart.  Sign up information is provided on this After Visit Summary.  MyChart is used to connect with patients for Virtual Visits (Telemedicine).  Patients are able to view lab/test results, encounter notes,  upcoming appointments, etc.  Non-urgent messages can be sent to your provider as well.   To learn more about what you can do with MyChart, go to [email or web address].   Other Instructions  Heart Healthy Diet Recommendations: A low-salt diet is recommended. Meats should be grilled, baked, or boiled. Avoid fried foods. Focus on lean protein sources like fish or chicken with vegetables and fruits. The American Heart Association is a Chief Technology Officer!  American Heart Association Diet and Lifeystyle Recommendations   Exercise recommendations: The American Heart Association recommends 150 minutes of moderate intensity exercise weekly. Try 30 minutes of moderate intensity exercise 4-5 times per week. This could include walking, jogging, or swimming.

## 2024-07-03 DIAGNOSIS — R002 Palpitations: Secondary | ICD-10-CM | POA: Diagnosis not present

## 2024-07-04 NOTE — Progress Notes (Signed)
 Left detailed message for patient as permitted on DPR form regarding heart monitor results.
